# Patient Record
Sex: Female | Born: 1940 | Race: Black or African American | Hispanic: No | Marital: Single | Smoking: Never smoker
Health system: Southern US, Community
[De-identification: ages and names within clinical notes are randomized; demographics above are authoritative.]

## PROBLEM LIST (undated history)

## (undated) DIAGNOSIS — E7800 Pure hypercholesterolemia, unspecified: Secondary | ICD-10-CM

## (undated) DIAGNOSIS — I10 Essential (primary) hypertension: Secondary | ICD-10-CM

---

## 2018-08-28 ENCOUNTER — Encounter (HOSPITAL_COMMUNITY): Payer: Self-pay

## 2018-08-28 ENCOUNTER — Emergency Department (HOSPITAL_COMMUNITY)
Admission: EM | Admit: 2018-08-28 | Discharge: 2018-08-28 | Disposition: A | Discharge status: Home / Self Care | Payer: BC Managed Care – PPO | Attending: Emergency Medicine | Admitting: Emergency Medicine

## 2018-08-28 ENCOUNTER — Other Ambulatory Visit: Payer: Self-pay

## 2018-08-28 DIAGNOSIS — I10 Essential (primary) hypertension: Secondary | ICD-10-CM

## 2018-08-28 LAB — COMPREHENSIVE METABOLIC PANEL
ALK PHOS: 74 U/L (ref 38–126)
ALT: 22 U/L (ref 0–44)
ANION GAP: 10 (ref 5–15)
AST: 28 U/L (ref 15–41)
Albumin: 4.1 g/dL (ref 3.5–5.0)
BILIRUBIN TOTAL: 0.9 mg/dL (ref 0.3–1.2)
BUN: 16 mg/dL (ref 8–23)
CALCIUM: 9.6 mg/dL (ref 8.9–10.3)
CO2: 28 mmol/L (ref 22–32)
Chloride: 106 mmol/L (ref 98–111)
Creatinine, Ser: 0.81 mg/dL (ref 0.44–1.00)
GFR calc Af Amer: >60 mL/min (ref >60)
Glucose, Bld: 92 mg/dL (ref 70–99)
Potassium: 4.1 mmol/L (ref 3.5–5.1)
Sodium: 144 mmol/L (ref 135–145)
TOTAL PROTEIN: 7.9 g/dL (ref 6.5–8.1)

## 2018-08-28 LAB — CBC
HEMATOCRIT: 41.9 % (ref 36.0–46.0)
Hemoglobin: 13.6 g/dL (ref 12.0–15.0)
MCH: 29.8 pg (ref 26.0–34.0)
MCHC: 32.5 g/dL (ref 30.0–36.0)
MCV: 91.9 fL (ref 78.0–100.0)
Platelets: 259 10*3/uL (ref 150–400)
RBC: 4.56 MIL/uL (ref 3.87–5.11)
RDW: 13.8 % (ref 11.5–15.5)
WBC: 9.5 10*3/uL (ref 4.0–10.5)

## 2018-08-28 MED ORDER — HYDROCHLOROTHIAZIDE 25 MG PO TABS: 25.0000 mg | ORAL_TABLET | Freq: Every day | ORAL | 1 refills | Status: AC

## 2018-08-28 MED ORDER — HYDROCHLOROTHIAZIDE 25 MG PO TABS
25.0000 mg | ORAL_TABLET | Freq: Once | ORAL | Status: AC
  Administered 2018-08-28: 25 mg via ORAL
  Filled 2018-08-28: qty 1

## 2018-08-28 NOTE — ED Notes (Signed)
Pt still denying any symptoms at this time. Pt denies dizziness, HA

## 2018-08-28 NOTE — ED Triage Notes (Signed)
She states she was slated to under go cataract removal this morning by Dr. Elmer PickerHecker. She was found at that time to be hypertensive. They sent her to her [facility] pcp, who subsequently sent her here. She is alert and oriented x 4 with clear speech. She denis any pain or discomfort, nor any sign of current illness.

## 2018-08-28 NOTE — Discharge Instructions (Addendum)
You were evaluated in the Emergency Department and after careful evaluation, we did not find any emergent condition requiring admission or further testing in the hospital.  It appears that you have essential hypertension.  With no symptoms today, this blood pressure would be best managed by your primary care provider.  We have provided you with a prescription for hydrochlorothiazide, which she should take daily until you meet with your primary care provider to discuss your long-term regimen.  Please return to the Emergency Department if you experience any worsening of your condition.  We encourage you to follow up with a primary care provider.  Thank you for allowing us to be a part of your care.

## 2018-08-28 NOTE — ED Provider Notes (Signed)
[facility] Emergency Department Provider Note MRN:  [id number]  Arrival date & time: 08/28/18     Chief Complaint   Hypertension   History of Present Illness   Pam [name] is a 77 y.o. year-old female with a history of arthritis presenting to the ED with chief complaint of hypertension.  Patient was found to be hypertensive today at her ophthalmology office, was scheduled to have her cataract surgery.  In response to this hypertension, patient was sent to her PCP, who subsequently sent her to the emergency department.  Patient feels completely her normal self, denies headache, vision change, no chest pain or shortness of breath, no recent fevers, no nausea or vomiting, no abdominal pain, no numbness or weakness in the arms or legs.  Does not take any medications for blood pressure.  Review of Systems  A complete 10 system review of systems was obtained and all systems are negative except as noted in the HPI and PMH.   Patient's Health History   Past medical history: Arthritis   No family history on file.  Social History   Socioeconomic History  . Marital status: Single    Spouse name: Not on file  . Number of children: Not on file  . Years of education: Not on file  . Highest education level: Not on file  Occupational History  . Not on file  Social Needs  . Financial resource strain: Not on file  . Food insecurity:    Worry: Not on file    Inability: Not on file  . Transportation needs:    Medical: Not on file    Non-medical: Not on file  Tobacco Use  . Smoking status: Never Smoker  . Smokeless tobacco: Never Used  Substance and Sexual Activity  . Alcohol use: Not on file  . Drug use: Not on file  . Sexual activity: Not on file  Lifestyle  . Physical activity:    Days per week: Not on file    Minutes per session: Not on file  . Stress: Not on file  Relationships  . Social connections:    Talks on phone: Not on file    Gets together: Not on  file    Attends religious service: Not on file    Active member of club or organization: Not on file    Attends meetings of clubs or organizations: Not on file    Relationship status: Not on file  . Intimate partner violence:    Fear of current or ex partner: Not on file    Emotionally abused: Not on file    Physically abused: Not on file    Forced sexual activity: Not on file  Other Topics Concern  . Not on file  Social History Narrative  . Not on file     Physical Exam  Vital Signs and Nursing Notes reviewed Vitals:   08/28/18 1615 08/28/18 1630  BP:  (!) 202/88  Pulse: 68 74  Resp: 12 16  Temp:    SpO2: 98% 99%    CONSTITUTIONAL: Well-appearing, NAD NEURO:  Alert and oriented x 3, no focal deficits EYES:  eyes equal and reactive ENT/NECK:  no LAD, no JVD CARDIO: Regular rate, well-perfused, normal S1 and S2 PULM:  CTAB no wheezing or rhonchi GI/GU:  normal bowel sounds, non-distended, non-tender MSK/SPINE:  No gross deformities, no edema SKIN:  no rash, atraumatic PSYCH:  Appropriate speech and behavior  Diagnostic and Interventional Summary    EKG Interpretation  Date/Time:    Ventricular Rate:    PR Interval:    QRS Duration:   QT Interval:    QTC Calculation:   R Axis:     Text Interpretation:        Labs Reviewed  CBC  COMPREHENSIVE METABOLIC PANEL    No orders to display    Medications  hydrochlorothiazide (HYDRODIURIL) tablet 25 mg (25 mg Oral Given 08/28/18 1617)     Procedures Critical Care  ED Course and Medical Decision Making  I have reviewed the triage vital signs and the nursing notes.  Pertinent labs & imaging results that were available during my care of the patient were reviewed by me and considered in my medical decision making (see below for details).    Asymptomatic hypertension in the [age] year old female.  Provided reassurance.  Patient has never been told she has had hypertension in the past, is not on any medications, has  not had blood work done in 6 months.  Will perform screening labs today to ensure normal kidney function advise close PCP follow-up.  Labs reassuring, patient tolerated dose of HCTZ well here in the ED.  Prescription for the same, will follow up with PCP.  After the discussed management above, the patient was determined to be safe for discharge.  The patient was in agreement with this plan and all questions regarding their care were answered.  ED return precautions were discussed and the patient will return to the ED with any significant worsening of condition.  Pam SowMichael [name]. Pilar PlateBero, MD [facility] [email or web address]  Final Clinical Impressions(s) / ED Diagnoses     ICD-10-CM   1. Hypertension, unspecified type I10     ED Discharge Orders         Ordered    hydrochlorothiazide (HYDRODIURIL) 25 MG tablet  Daily     08/28/18 1659             Sabas SousBero, Pam Flight M, MD 08/28/18 1702

## 2018-08-28 NOTE — ED Notes (Signed)
Pt ambulated to the restroom with out difficulty.

## 2021-04-29 ENCOUNTER — Encounter (HOSPITAL_BASED_OUTPATIENT_CLINIC_OR_DEPARTMENT_OTHER): Payer: BC Managed Care – PPO | Admitting: Internal Medicine

## 2021-05-14 ENCOUNTER — Encounter (HOSPITAL_BASED_OUTPATIENT_CLINIC_OR_DEPARTMENT_OTHER): Payer: BC Managed Care – PPO | Attending: Internal Medicine | Admitting: Internal Medicine

## 2021-05-14 ENCOUNTER — Other Ambulatory Visit: Payer: Self-pay

## 2021-05-14 DIAGNOSIS — I872 Venous insufficiency (chronic) (peripheral): Secondary | ICD-10-CM | POA: Insufficient documentation

## 2021-05-14 DIAGNOSIS — L97822 Non-pressure chronic ulcer of other part of left lower leg with fat layer exposed: Secondary | ICD-10-CM | POA: Insufficient documentation

## 2021-05-14 DIAGNOSIS — I10 Essential (primary) hypertension: Secondary | ICD-10-CM | POA: Diagnosis not present

## 2021-05-14 DIAGNOSIS — E7800 Pure hypercholesterolemia, unspecified: Secondary | ICD-10-CM | POA: Diagnosis not present

## 2021-05-14 DIAGNOSIS — I87332 Chronic venous hypertension (idiopathic) with ulcer and inflammation of left lower extremity: Secondary | ICD-10-CM | POA: Diagnosis not present

## 2021-05-17 NOTE — Progress Notes (Signed)
Pam [name], Pam [name] ([id number]) Visit Report for 05/14/2021 Chief Complaint Document Details Patient Name: Date of Service: Pam [name] 05/14/2021 1:15 PM Medical Record Number: [id number] Patient Account Number: [id number] Date of Birth/Sex: Treating RN: 08-25-1941 (80 y.o. Female) [name] Iba Primary Care Provider: [name] Other Clinician: Referring Provider: Treating Provider/Extender: Angus Palms Weeks in Treatment: 0 Information Obtained from: Patient Chief Complaint 05/14/2021; patient is here for review of a wound on her left medial lower leg Electronic Signature(s) Signed: 05/15/2021 7:13:41 AM By: Baltazar Najjar MD Entered By: Baltazar Najjar on 05/14/2021 15:22:54 -------------------------------------------------------------------------------- Debridement Details Patient Name: Date of Service: Pam [name] 05/14/2021 1:15 PM Medical Record Number: [id number] Patient Account Number: [id number] Date of Birth/Sex: Treating RN: 1941-02-24 (80 y.o. Female) [name] Iba Primary Care Provider: [name] Other Clinician: Referring Provider: Treating Provider/Extender: Angus Palms Weeks in Treatment: 0 Debridement Performed for Assessment: Wound #1 Left,Medial Lower Leg Performed By: Physician Maxwell Caul., MD Debridement Type: Debridement Severity of Tissue Pre Debridement: Fat layer exposed Level of Consciousness (Pre-procedure): Awake and Alert Pre-procedure Verification/Time Out Yes - 14:29 Taken: Start Time: 14:30 Pain Control: Lidocaine 4% T opical Solution T Area Debrided (L x W): otal 6.5 (cm) x 9 (cm) = 58.5 (cm) Tissue and other material debrided: Non-Viable, Slough, Subcutaneous, Slough Level: Skin/Subcutaneous Tissue Debridement Description: Excisional Instrument: Curette Bleeding: Minimum Hemostasis Achieved: Pressure End Time: 14:39 Response to Treatment: Procedure was tolerated well Level of Consciousness (Post-  Awake and Alert procedure): Post Debridement Measurements of Total Wound Length: (cm) 6.5 Width: (cm) 9 Depth: (cm) 0.1 Volume: (cm) 4.595 Character of Wound/Ulcer Post Debridement: Stable Severity of Tissue Post Debridement: Fat layer exposed Post Procedure Diagnosis Same as Pre-procedure Electronic Signature(s) Signed: 05/14/2021 6:03:14 PM By: [name] Iba Signed: 05/15/2021 7:13:41 AM By: Baltazar Najjar MD Entered By: Baltazar Najjar on 05/14/2021 15:22:05 -------------------------------------------------------------------------------- HPI Details Patient Name: Date of Service: MANNIE, WINELAND 05/14/2021 1:15 PM Medical Record Number: [id number] Patient Account Number: [id number] Date of Birth/Sex: Treating RN: May 04, 1941 (80 y.o. Female) [name] Iba Primary Care Provider: [name] Other Clinician: Referring Provider: Treating Provider/Extender: Angus Palms Weeks in Treatment: 0 History of Present Illness HPI Description: ADMISSION 05/14/2021 with This is a 80 year old reasonably healthy woman who is still working at [employer] and about to retire at the end of the month. She has had a problem with lower extremity edema for about the last 6 months she tells me. 2 months ago she noted small bumps on both of her legs however the left one medially morphed into a large blister. She was seen by her primary physician Dr. [name] on 04/19/2021. Put on doxycycline. He noted at that time that the wound had been weeping for the last 5 weeks. The patient does not have a wound history. She has been placing hydrocortisone on her wound but nothing else and no compression Past medical history includes hypertension, osteoarthritis and hypercholesterolemia ABI in our clinic was quite normal at 0.93 on the left Electronic Signature(s) Signed: 05/15/2021 7:13:41 AM By: Baltazar Najjar MD Entered By: Baltazar Najjar on 05/14/2021  15:24:49 -------------------------------------------------------------------------------- Physical Exam Details Patient Name: Date of Service: Pam [name] 05/14/2021 1:15 PM Medical Record Number: [id number] Patient Account Number: [id number] Date of Birth/Sex: Treating RN: [birth year] (80 y.o. Female) [name] Iba Primary Care Provider: [name] Other Clinician: Referring Provider: Treating Provider/Extender: Angus Palms Weeks in Treatment: 0 Constitutional Patient is hypertensive.. Pulse regular and within target range for  patient.Marland Kitchen Respirations regular, non-labored and within target range.. Temperature is normal and within the target range for the patient.Marland Kitchen Appears in no distress. Respiratory work of breathing is normal. Cardiovascular Pedal pulses are palpable. 3+ edema in both lower legs pitting to the level of her knees. No warmth or tenderness. Gastrointestinal (GI) . Genitourinary (GU) . Psychiatric Patient appears depressed today. Somewhat angry. Notes Wound exam; the area in question is on the left medial lower leg. Large amount of flaking epidermis around this some erythema but no obvious cellulitis currently. The remaining wound is superficial. Tightly adherent debris on the surface. I used a #5 curette to remove as much of this is possible this cleans up quite nicely. Electronic Signature(s) Signed: 05/15/2021 7:13:41 AM By: Baltazar Najjar MD Entered By: Baltazar Najjar on 05/14/2021 15:26:47 -------------------------------------------------------------------------------- Physician Orders Details Patient Name: Date of Service: Pam [name] 05/14/2021 1:15 PM Medical Record Number: [id number] Patient Account Number: [id number] Date of Birth/Sex: Treating RN: Aug 01, 1941 (80 y.o. Female) Fonnie Mu Primary Care Provider: [name] Other Clinician: Referring Provider: Treating Provider/Extender: Angus Palms Weeks in  Treatment: 0 Verbal / Phone Orders: No Diagnosis Coding Follow-up Appointments ppointment in 1 week. - with Dr. Leanord Hawking Return A Edema Control - Lymphedema / SCD / Other Avoid standing for long periods of time. Exercise regularly Other Edema Control Orders/Instructions: - Whenever sitting elevate legs at least to level of belly button. Additional Orders / Instructions Follow Nutritious Diet Wound Treatment Wound #1 - Lower Leg Wound Laterality: Left, Medial Cleanser: Soap and Water 1 x Per Week/30 Days Discharge Instructions: May shower and wash wound with dial antibacterial soap and water prior to dressing change. Cleanser: Wound Cleanser 1 x Per Week/30 Days Discharge Instructions: Cleanse the wound with wound cleanser prior to applying a clean dressing using gauze sponges, not tissue or cotton balls. Peri-Wound Care: Triamcinolone 15 (g) 1 x Per Week/30 Days Discharge Instructions: T wound and periwound o Peri-Wound Care: Sween Lotion (Moisturizing lotion) 1 x Per Week/30 Days Discharge Instructions: Apply to periwound Prim Dressing: IODOFLEX 0.9% Cadexomer Iodine Pad 4x6 cm 1 x Per Week/30 Days ary Discharge Instructions: Apply to wound bed as instructed Secondary Dressing: Woven Gauze Sponge, Non-Sterile 4x4 in 1 x Per Week/30 Days Discharge Instructions: Apply over primary dressing as directed. Secondary Dressing: ABD Pad, 8x10 1 x Per Week/30 Days Discharge Instructions: Apply over primary dressing as directed. Compression Wrap: FourPress (4 layer compression wrap) 1 x Per Week/30 Days Discharge Instructions: Apply four layer compression as directed. May also use Miliken CoFlex 2 layer compression system as alternative. Electronic Signature(s) Signed: 05/15/2021 7:13:41 AM By: Baltazar Najjar MD Signed: 05/17/2021 5:37:32 PM By: Fonnie Mu RN Entered By: Fonnie Mu on 05/14/2021  14:43:56 -------------------------------------------------------------------------------- Problem List Details Patient Name: Date of Service: NAHDIA, DOUCET 05/14/2021 1:15 PM Medical Record Number: [id number] Patient Account Number: [id number] Date of Birth/Sex: Treating RN: May 02, 1941 (80 y.o. Female) [name] Iba Primary Care Provider: [name] Other Clinician: Referring Provider: Treating Provider/Extender: Angus Palms Weeks in Treatment: 0 Active Problems ICD-10 Encounter Code Description Active Date MDM Diagnosis I87.332 Chronic venous hypertension (idiopathic) with ulcer and inflammation of left 05/14/2021 No Yes lower extremity L97.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 05/14/2021 No Yes of skin Inactive Problems Resolved Problems Electronic Signature(s) Signed: 05/15/2021 7:13:41 AM By: Baltazar Najjar MD Entered By: Baltazar Najjar on 05/14/2021 14:47:01 -------------------------------------------------------------------------------- Progress Note Details Patient Name: Date of Service: [address]  NELL 05/14/2021 1:15 PM Medical Record Number: [id number] Patient Account Number: [id number] Date of Birth/Sex: Treating RN: 06-27-1941 (80 y.o. Female) [name] Iba Primary Care Provider: [name] Other Clinician: Referring Provider: Treating Provider/Extender: Angus Palms Weeks in Treatment: 0 Subjective Chief Complaint Information obtained from Patient 05/14/2021; patient is here for review of a wound on her left medial lower leg History of Present Illness (HPI) ADMISSION 05/14/2021 with This is a 80 year old reasonably healthy woman who is still working at [employer] and about to retire at the end of the month. She has had a problem with lower extremity edema for about the last 6 months she tells me. 2 months ago she noted small bumps on both of her legs however the left one medially morphed  into a large blister. She was seen by her primary physician Dr. [name] on 04/19/2021. Put on doxycycline. He noted at that time that the wound had been weeping for the last 5 weeks. The patient does not have a wound history. She has been placing hydrocortisone on her wound but nothing else and no compression Past medical history includes hypertension, osteoarthritis and hypercholesterolemia ABI in our clinic was quite normal at 0.93 on the left Patient History Information obtained from Patient. Allergies No Known Allergies Family History Unknown History. Social History Never smoker, Marital Status - Single, Alcohol Use - Never, Drug Use - No History, Caffeine Use - Moderate. Medical History Eyes Patient has history of Cataracts, Glaucoma Denies history of Optic Neuritis Ear/Nose/Mouth/Throat Denies history of Chronic sinus problems/congestion, Middle ear problems Hematologic/Lymphatic Denies history of Anemia, Hemophilia, Human Immunodeficiency Virus, Lymphedema, Sickle Cell Disease Respiratory Denies history of Aspiration, Asthma, Chronic Obstructive Pulmonary Disease (COPD), Pneumothorax, Sleep Apnea, Tuberculosis Cardiovascular Patient has history of Hypertension Denies history of Angina, Arrhythmia, Congestive Heart Failure, Coronary Artery Disease, Deep Vein Thrombosis, Hypotension, Myocardial Infarction, Peripheral Arterial Disease, Peripheral Venous Disease, Phlebitis, Vasculitis Gastrointestinal Denies history of Cirrhosis , Colitis, Crohnoos, Hepatitis A, Hepatitis B, Hepatitis C Endocrine Denies history of Type I Diabetes, Type II Diabetes Genitourinary Denies history of End Stage Renal Disease Immunological Denies history of Lupus Erythematosus, Raynaudoos, Scleroderma Integumentary (Skin) Denies history of History of Burn Musculoskeletal Patient has history of Osteoarthritis Denies history of Gout, Rheumatoid Arthritis, Osteomyelitis Neurologic Denies history of  Dementia, Neuropathy, Quadriplegia, Paraplegia, Seizure Disorder Oncologic Denies history of Received Chemotherapy, Received Radiation Medical A Surgical History Notes nd Cardiovascular hyperlipidemia Review of Systems (ROS) Constitutional Symptoms (General Health) Denies complaints or symptoms of Fatigue, Fever, Chills, Marked Weight Change. Eyes Denies complaints or symptoms of Dry Eyes, Vision Changes, Glasses / Contacts. Ear/Nose/Mouth/Throat Denies complaints or symptoms of Chronic sinus problems or rhinitis. Respiratory Denies complaints or symptoms of Chronic or frequent coughs, Shortness of Breath. Cardiovascular Denies complaints or symptoms of Chest pain. Gastrointestinal Denies complaints or symptoms of Frequent diarrhea, Nausea, Vomiting. Endocrine Denies complaints or symptoms of Heat/cold intolerance. Genitourinary Denies complaints or symptoms of Frequent urination. Musculoskeletal Denies complaints or symptoms of Muscle Pain, Muscle Weakness. Neurologic Denies complaints or symptoms of Numbness/parasthesias. Psychiatric Denies complaints or symptoms of Claustrophobia, Suicidal. Objective Constitutional Patient is hypertensive.. Pulse regular and within target range for patient.Marland Kitchen Respirations regular, non-labored and within target range.. Temperature is normal and within the target range for the patient.Marland Kitchen Appears in no distress. Vitals Time Taken: 2:04 PM, Height: 64 in, Source: Stated, Weight: 180 lbs, Source: Stated, BMI: 30.9, Temperature: 97.9 F, Pulse: 64 bpm, Respiratory Rate: 17 breaths/min, Blood Pressure: 150/77 mmHg. Respiratory work  of breathing is normal. Cardiovascular Pedal pulses are palpable. 3+ edema in both lower legs pitting to the level of her knees. No warmth or tenderness. Psychiatric Patient appears depressed today. Somewhat angry. General Notes: Wound exam; the area in question is on the left medial lower leg. Large amount of flaking  epidermis around this some erythema but no obvious cellulitis currently. The remaining wound is superficial. Tightly adherent debris on the surface. I used a #5 curette to remove as much of this is possible this cleans up quite nicely. Integumentary (Hair, Skin) Wound #1 status is Open. Original cause of wound was Gradually Appeared. The date acquired was: 03/14/2021. The wound is located on the Left,Medial Lower Leg. The wound measures 6.5cm length x 9cm width x 0.1cm depth; 45.946cm^2 area and 4.595cm^3 volume. There is Fat Layer (Subcutaneous Tissue) exposed. There is no tunneling or undermining noted. There is a medium amount of serosanguineous drainage noted. The wound margin is distinct with the outline attached to the wound base. There is medium (34-66%) red, pink granulation within the wound bed. There is a medium (34-66%) amount of necrotic tissue within the wound bed including Adherent Slough. 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 There was a Excisional Skin/Subcutaneous Tissue Debridement with a total area of 58.5 sq cm performed by Maxwell Caul., MD. With the following instrument(s): Curette to remove Non-Viable tissue/material. Material removed includes Subcutaneous Tissue and Slough and after achieving pain control using Lidocaine 4% T opical Solution. No specimens were taken. A time out was conducted at 14:29, prior to the start of the procedure. A Minimum amount of bleeding was controlled with Pressure. The procedure was tolerated well. Post Debridement Measurements: 6.5cm length x 9cm width x 0.1cm depth;  4.595cm^3 volume. Character of Wound/Ulcer Post Debridement is stable. Severity of Tissue Post Debridement is: Fat layer exposed. Post procedure Diagnosis Wound #1: Same as Pre-Procedure Plan Follow-up Appointments: Return Appointment in 1 week. - with Dr. Leanord Hawking Edema Control - Lymphedema / SCD / Other: Avoid standing for long periods of time. Exercise regularly Other Edema Control Orders/Instructions: - Whenever sitting elevate legs at least to level of belly button. Additional Orders / Instructions: Follow Nutritious Diet WOUND #1: - Lower Leg Wound Laterality: Left, Medial Cleanser: Soap and Water 1 x Per Week/30 Days Discharge Instructions: May shower and wash wound with dial antibacterial soap and water prior to dressing change. Cleanser: Wound Cleanser 1 x Per Week/30 Days Discharge Instructions: Cleanse the wound with wound cleanser prior to applying a clean dressing using gauze sponges, not tissue or cotton balls. Peri-Wound Care: Triamcinolone 15 (g) 1 x Per Week/30 Days Discharge Instructions: T wound and periwound o Peri-Wound Care: Sween Lotion (Moisturizing lotion) 1 x Per Week/30 Days Discharge Instructions: Apply to periwound Prim Dressing: IODOFLEX 0.9% Cadexomer Iodine Pad 4x6 cm 1 x Per Week/30 Days ary Discharge Instructions: Apply to wound bed as instructed Secondary Dressing: Woven Gauze Sponge, Non-Sterile 4x4 in 1 x Per Week/30 Days Discharge Instructions: Apply over primary dressing as directed. Secondary Dressing: ABD Pad, 8x10 1 x Per Week/30 Days Discharge Instructions: Apply over primary dressing as directed. Com pression Wrap: FourPress (4 layer compression wrap) 1 x Per  Week/30 Days Discharge Instructions: Apply four layer compression as directed. May also use Miliken CoFlex 2 layer compression system as alternative. 1. Liberal use of TCA 2. Iodoflex/ABDs under 4-layer compression. We will see her back next week 3. Dressing to be left intact. I advised  her to keep her leg elevated even at work as much as she could. Activity is encouraged to be because muscle contraction promotes removal of fluids from the lower extremities. 4. We will see her back next week. I do not think she requires venous reflux studies at present we will see how she does in terms of healing this. She is definitely going to need compression stockings after this closes 5. I note she is on amlodipine as a blood pressure medication which can sometimes add to edema. Might not be the best choice for her she is seeing her primary doctor on Monday 6. I see no evidence of infection currently however the loss of surface epithelium looks quite characteristic of a wound cellulitis presumably the doxycycline has taken care of this. I spent 40 minutes in review of this patient's past medical history, face-to-face evaluation and preparation of this record Electronic Signature(s) Signed: 05/15/2021 7:13:41 AM By: Baltazar Najjar MD Entered By: Baltazar Najjar on 05/14/2021 15:28:58 -------------------------------------------------------------------------------- HxROS Details Patient Name: Date of Service: LEZA, APSEY 05/14/2021 1:15 PM Medical Record Number: [id number] Patient Account Number: [id number] Date of Birth/Sex: Treating RN: March 08, 1941 (80 y.o. Female) Fonnie Mu Primary Care Provider: [name] Other Clinician: Referring Provider: Treating Provider/Extender: Angus Palms Weeks in Treatment: 0 Information Obtained From Patient Constitutional Symptoms (General Health) Complaints and Symptoms: Negative for: Fatigue; Fever; Chills; Marked Weight Change Eyes Complaints and Symptoms: Negative for: Dry Eyes; Vision Changes; Glasses / Contacts Medical History: Positive for: Cataracts; Glaucoma Negative for: Optic Neuritis Ear/Nose/Mouth/Throat Complaints and Symptoms: Negative for: Chronic sinus problems or rhinitis Medical History: Negative  for: Chronic sinus problems/congestion; Middle ear problems Respiratory Complaints and Symptoms: Negative for: Chronic or frequent coughs; Shortness of Breath Medical History: Negative for: Aspiration; Asthma; Chronic Obstructive Pulmonary Disease (COPD); Pneumothorax; Sleep Apnea; Tuberculosis Cardiovascular Complaints and Symptoms: Negative for: Chest pain Medical History: Positive for: Hypertension Negative for: Angina; Arrhythmia; Congestive Heart Failure; Coronary Artery Disease; Deep Vein Thrombosis; Hypotension; Myocardial Infarction; Peripheral Arterial Disease; Peripheral Venous Disease; Phlebitis; Vasculitis Past Medical History Notes: hyperlipidemia Gastrointestinal Complaints and Symptoms: Negative for: Frequent diarrhea; Nausea; Vomiting Medical History: Negative for: Cirrhosis ; Colitis; Crohns; Hepatitis A; Hepatitis B; Hepatitis C Endocrine Complaints and Symptoms: Negative for: Heat/cold intolerance Medical History: Negative for: Type I Diabetes; Type II Diabetes Genitourinary Complaints and Symptoms: Negative for: Frequent urination Medical History: Negative for: End Stage Renal Disease Musculoskeletal Complaints and Symptoms: Negative for: Muscle Pain; Muscle Weakness Medical History: Positive for: Osteoarthritis Negative for: Gout; Rheumatoid Arthritis; Osteomyelitis Neurologic Complaints and Symptoms: Negative for: Numbness/parasthesias Medical History: Negative for: Dementia; Neuropathy; Quadriplegia; Paraplegia; Seizure Disorder Psychiatric Complaints and Symptoms: Negative for: Claustrophobia; Suicidal Hematologic/Lymphatic Medical History: Negative for: Anemia; Hemophilia; Human Immunodeficiency Virus; Lymphedema; Sickle Cell Disease Immunological Medical History: Negative for: Lupus Erythematosus; Raynauds; Scleroderma Integumentary (Skin) Medical History: Negative for: History of Burn Oncologic Medical History: Negative for: Received  Chemotherapy; Received Radiation HBO Extended History Items Eyes: Eyes: Cataracts Glaucoma Immunizations Pneumococcal Vaccine: Received Pneumococcal Vaccination: Yes Implantable Devices None Family and Social History Unknown History: Yes; Never smoker; Marital Status - Single; Alcohol Use: Never; Drug Use: No History; Caffeine Use: Moderate; Financial Concerns: No; Food, Clothing or Shelter Needs: No; Support  System Lacking: No; Transportation Concerns: No Electronic Signature(s) Signed: 05/15/2021 7:13:41 AM By: [name], Payeton Germani MD Signed: 05/17/2021 5:37:32 PM By: Fonnie MuBreedlove, Lauren RN Entered By: Fonnie MuBreedlove, Lauren on 05/14/2021 14:09:46 -------------------------------------------------------------------------------- SuperBill Details Patient Name: Date of Service: [name], [name] 05/14/2021 Medical Record Number: [id number] Patient Account Number: [id number] Date of Birth/Sex: Treating RN: 09-05-1941 (80 y.o. Female) [name] IbaBarnhart, Jodi Primary Care Provider: Deatra JamesSun, [name] Other Clinician: Referring Provider: Treating Provider/Extender: Angus Palmsobson, Harlan Ervine Sun, [name] Weeks in Treatment: 0 Diagnosis Coding ICD-10 Codes Code Description [phone] Chronic venous hypertension (idiopathic) with ulcer and inflammation of left lower extremity L97.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 of skin Facility Procedures CPT4 Code: [id number] Description: 99213 - WOUND CARE VISIT-LEV 3 EST PT Modifier: 25 Quantity: 1 CPT4 Code: [id number] Description: 11042 - DEB SUBQ TISSUE 20 SQ CM/< ICD-10 Diagnosis Description L97.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 Modifier: of skin Quantity: 1 CPT4 Code: [id number] Description: 11045 - DEB SUBQ TISS EA ADDL 20CM ICD-10 Diagnosis Description L97.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 Modifier: of skin Quantity: 2 Physician  Procedures : CPT4 Code Description Modifier [id number] WC PHYS LEVEL 3 NEW PT 25 ICD-10 Diagnosis Description I87.332 Chronic venous hypertension (idiopathic) with ulcer and inflammation of left lower extremity L97.821 Non-pressure chronic ulcer of other part of left  lower leg limited to breakdown of skin Quantity: 1 : [id number] 11042 - WC PHYS SUBQ TISS 20 SQ CM ICD-10 Diagnosis Description L97.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 of skin Quantity: 1 : [id number] 11045 - WC PHYS SUBQ TISS EA ADDL 20 CM ICD-10 Diagnosis Description L97.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 of skin Quantity: 2 Electronic Signature(s) Signed: 05/15/2021 7:13:41 AM By: [name], Jimmie Rueter MD Previous Signature: 05/14/2021 3:04:22 PM Version By: [name] IbaBarnhart, Jodi Entered By: [name], Kairee Kozma on 05/14/2021 15:29:31

## 2021-05-17 NOTE — Progress Notes (Signed)
Pam [name], Pam [name] ([id number]) Visit Report for 05/14/2021 Allergy List Details Patient Name: Date of Service: Pam [name], Pam [name] 05/14/2021 1:15 PM Medical Record Number: [id number] Patient Account Number: [id number] Date of Birth/Sex: Treating RN: [birth year] (80 y.o. Female) Fonnie Mu Primary Care Udell Blasingame: [name] Other Clinician: Referring Boen Sterbenz: Treating Breon Rehm/Extender: Angus Palms Weeks in Treatment: 0 Allergies Active Allergies No Known Allergies Allergy Notes Electronic Signature(s) Signed: 05/17/2021 5:37:32 PM By: Fonnie Mu RN Entered By: Fonnie Mu on 05/14/2021 14:06:49 -------------------------------------------------------------------------------- Arrival Information Details Patient Name: Date of Service: Pam [name], Pam [name] 05/14/2021 1:15 PM Medical Record Number: [id number] Patient Account Number: [id number] Date of Birth/Sex: Treating RN: [birth year] (80 y.o. Female) Fonnie Mu Primary Care Hortencia Martire: [name] Other Clinician: Referring Andreyah Natividad: Treating Scottlynn Lindell/Extender: Angus Palms Weeks in Treatment: 0 Visit Information Patient Arrived: Ambulatory Arrival Time: 14:04 Accompanied By: self Transfer Assistance: None Patient Identification Verified: Yes Secondary Verification Process Completed: Yes Patient Requires Transmission-Based Precautions: No Patient Has Alerts: No Electronic Signature(s) Signed: 05/17/2021 5:37:32 PM By: Fonnie Mu RN Entered By: Fonnie Mu on 05/14/2021 14:04:36 -------------------------------------------------------------------------------- Clinic Level of Care Assessment Details Patient Name: Date of Service: Pam [name], Pam [name] 05/14/2021 1:15 PM Medical Record Number: [id number] Patient Account Number: [id number] Date of Birth/Sex: Treating RN: 07/27/1941 (80 y.o. Female) Fonnie Mu Primary Care Arnold Depinto: [name] Other Clinician: Referring  Woodfin Kiss: Treating Prabhav Faulkenberry/Extender: Angus Palms Weeks in Treatment: 0 Clinic Level of Care Assessment Items TOOL 1 Quantity Score X- 1 0 Use when EandM and Procedure is performed on INITIAL visit ASSESSMENTS - Nursing Assessment / Reassessment X- 1 20 General Physical Exam (combine w/ comprehensive assessment (listed just below) when performed on new pt. evals) X- 1 25 Comprehensive Assessment (HX, ROS, Risk Assessments, Wounds Hx, etc.) ASSESSMENTS - Wound and Skin Assessment / Reassessment []  - 0 Dermatologic / Skin Assessment (not related to wound area) ASSESSMENTS - Ostomy and/or Continence Assessment and Care []  - 0 Incontinence Assessment and Management []  - 0 Ostomy Care Assessment and Management (repouching, etc.) PROCESS - Coordination of Care []  - 0 Simple Patient / Family Education for ongoing care X- 1 20 Complex (extensive) Patient / Family Education for ongoing care X- 1 10 Staff obtains , Records, T Results / Process Orders est []  - 0 Staff telephones HHA, Nursing Homes / Clarify orders / etc []  - 0 Routine Transfer to another Facility (non-emergent condition) []  - 0 Routine Hospital Admission (non-emergent condition) []  - 0 New Admissions / / Ordering NPWT Apligraf, etc. , []  - 0 Emergency Hospital Admission (emergent condition) PROCESS - Special Needs []  - 0 Pediatric / Minor Patient Management []  - 0 Isolation Patient Management []  - 0 Hearing / Language / Visual special needs []  - 0 Assessment of Community assistance (transportation, D/C planning, etc.) []  - 0 Additional assistance / Altered mentation []  - 0 Support Surface(s) Assessment (bed, cushion, seat, etc.) INTERVENTIONS - Miscellaneous []  - 0 External ear exam []  - 0 Patient Transfer (multiple staff / / Similar devices) []  - 0 Simple Staple / Suture removal (25 or less) []  - 0 Complex Staple / Suture removal (26 or  more) []  - 0 Hypo/Hyperglycemic Management (do not check if billed separately) X- 1 15 Ankle / Brachial Index (ABI) - do not check if billed separately Has the patient been seen at the hospital within the last three years: Yes Total Score: 90 Level Of Care: New/Established - Level 3 Electronic Signature(s) Signed: 05/17/2021 5:37:32 PM  By: Fonnie Mu RN Entered By: Fonnie Mu on 05/14/2021 14:44:56 -------------------------------------------------------------------------------- Encounter Discharge Information Details Patient Name: Date of Service: Pam [name], Pam [name] 05/14/2021 1:15 PM Medical Record Number: [id number] Patient Account Number: [id number] Date of Birth/Sex: Treating RN: 1941/12/05 (80 y.o. Female) Zenaida Deed Primary Care Kadience Macchi: [name] Other Clinician: Referring Theodora Lalanne: Treating Alyxander Kollmann/Extender: Angus Palms Weeks in Treatment: 0 Encounter Discharge Information Items Post Procedure Vitals Discharge Condition: Stable Temperature (F): 97.9 Ambulatory Status: Ambulatory Pulse (bpm): 64 Discharge Destination: Home Respiratory Rate (breaths/min): 18 Transportation: Private Auto Blood Pressure (mmHg): 150/77 Accompanied By: self Schedule Follow-up Appointment: Yes Clinical Summary of Care: Patient Declined Electronic Signature(s) Signed: 05/14/2021 6:25:30 PM By: Zenaida Deed RN, BSN Entered By: Zenaida Deed on 05/14/2021 18:17:34 -------------------------------------------------------------------------------- Lower Extremity Assessment Details Patient Name: Date of Service: Pam [name], Pam [name] 05/14/2021 1:15 PM Medical Record Number: [id number] Patient Account Number: [id number] Date of Birth/Sex: Treating RN: [birth year] (80 y.o. Female) Fonnie Mu Primary Care Dayshaun Whobrey: [name] Other Clinician: Referring Emberlyn Burlison: Treating Nyeem Stoke/Extender: Angus Palms Weeks in Treatment: 0 Edema  Assessment Assessed: [name]: Yes] [Right: No] Edema: [Left: Ye] [Right: s] Calf Left: Right: Point of Measurement: 35 cm From Medial Instep 36 cm Ankle Left: Right: Point of Measurement: 9 cm From Medial Instep 25 cm Knee To Floor Left: Right: From Medial Instep 41 cm Vascular Assessment Pulses: Dorsalis Pedis Palpable: [Left:Yes] Posterior Tibial Palpable: [Left:Yes] Blood Pressure: Brachial: [Left:150] Ankle: [Left:Dorsalis Pedis: 140] Ankle Brachial Index: [Left:0.93] Electronic Signature(s) Signed: 05/17/2021 5:37:32 PM By: Fonnie Mu RN Entered By: Fonnie Mu on 05/14/2021 14:22:31 -------------------------------------------------------------------------------- Multi Wound Chart Details Patient Name: Date of Service: Pam [name], Pam [name] 05/14/2021 1:15 PM Medical Record Number: [id number] Patient Account Number: [id number] Date of Birth/Sex: Treating RN: 08-27-1941 (80 y.o. Female) [name] Iba Primary Care Lonzell Dorris: [name] Other Clinician: Referring Minard Millirons: Treating Kalkidan Caudell/Extender: Angus Palms Weeks in Treatment: 0 Vital Signs Height(in): 64 Pulse(bpm): 64 Weight(lbs): 180 Blood Pressure(mmHg): 150/77 Body Mass Index(BMI): 31 Temperature(F): 97.9 Respiratory Rate(breaths/min): 17 Photos: [1:No Photos Left, Medial Lower Leg] [N/A:N/A N/A] Wound Location: [1:Gradually Appeared] [N/A:N/A] Wounding Event: [1:Venous Leg Ulcer] [N/A:N/A] Primary Etiology: [1:Cataracts, Glaucoma, Hypertension, N/A] Comorbid History: [1:Osteoarthritis 03/14/2021] [N/A:N/A] Date Acquired: [1:0] [N/A:N/A] Weeks of Treatment: [1:Open] [N/A:N/A] Wound Status: [1:6.5x9x0.1] [N/A:N/A] Measurements L x W x D (cm) [1:45.946] [N/A:N/A] A (cm) : rea [1:4.595] [N/A:N/A] Volume (cm) : [1:Full Thickness With Exposed Support N/A] Classification: [1:Structures Medium] [N/A:N/A] Exudate A mount: [1:Serosanguineous] [N/A:N/A] Exudate Type: [1:red, brown]  [N/A:N/A] Exudate Color: [1:Distinct, outline attached] [N/A:N/A] Wound Margin: [1:Medium (34-66%)] [N/A:N/A] Granulation A mount: [1:Red, Pink] [N/A:N/A] Granulation Quality: [1:Medium (34-66%)] [N/A:N/A] Necrotic A mount: [1:Fat Layer (Subcutaneous Tissue): Yes N/A] Exposed Structures: [1:Fascia: No Tendon: No Muscle: No Joint: No Bone: No None] [N/A:N/A] Epithelialization: [1:Debridement - Excisional] [N/A:N/A] Debridement: Pre-procedure Verification/Time Out 14:29 [N/A:N/A] Taken: [1:Lidocaine 4% Topical Solution] [N/A:N/A] Pain Control: [1:Subcutaneous, Slough] [N/A:N/A] Tissue Debrided: [1:Skin/Subcutaneous Tissue] [N/A:N/A] Level: [1:58.5] [N/A:N/A] Debridement A (sq cm): [1:rea Curette] [N/A:N/A] Instrument: [1:Minimum] [N/A:N/A] Bleeding: [1:Pressure] [N/A:N/A] Hemostasis A chieved: [1:Procedure was tolerated well] [N/A:N/A] Debridement Treatment Response: [1:6.5x9x0.1] [N/A:N/A] Post Debridement Measurements L x W x D (cm) [1:4.595] [N/A:N/A] Post Debridement Volume: (cm) [1:Debridement] [N/A:N/A] Treatment Notes Wound #1 (Lower Leg) Wound Laterality: Left, Medial Cleanser Wound Cleanser Discharge Instruction: Cleanse the wound with wound cleanser prior to applying a clean dressing using gauze sponges, not tissue or cotton balls. Soap and Water Discharge Instruction: May shower and wash wound with dial antibacterial soap and water prior to  dressing change. Peri-Wound Care Triamcinolone 15 (g) Discharge Instruction: T wound and periwound o Sween Lotion (Moisturizing lotion) Discharge Instruction: Apply to periwound Topical Primary Dressing IODOFLEX 0.9% Cadexomer Iodine Pad 4x6 cm Discharge Instruction: Apply to wound bed as instructed Secondary Dressing Woven Gauze Sponge, Non-Sterile 4x4 in Discharge Instruction: Apply over primary dressing as directed. ABD Pad, 8x10 Discharge Instruction: Apply over primary dressing as directed. Secured With Compression  Wrap FourPress (4 layer compression wrap) Discharge Instruction: Apply four layer compression as directed. May also use Miliken CoFlex 2 layer compression system as alternative. Compression Stockings Add-Ons Electronic Signature(s) Signed: 05/14/2021 6:03:14 PM By: [name] Iba Signed: 05/15/2021 7:13:41 AM By: Baltazar Najjar MD Entered By: Baltazar Najjar on 05/14/2021 15:21:51 -------------------------------------------------------------------------------- Multi-Disciplinary Care Plan Details Patient Name: Date of Service: Pam [name], Pam [name] 05/14/2021 1:15 PM Medical Record Number: [id number] Patient Account Number: [id number] Date of Birth/Sex: Treating RN: 1941/06/27 (80 y.o. Female) Fonnie Mu Primary Care Aniqua Briere: [name] Other Clinician: Referring Treniya Lobb: Treating Rainen Vanrossum/Extender: Angus Palms Weeks in Treatment: 0 Active Inactive Venous Leg Ulcer Nursing Diagnoses: Actual venous Insuffiency (use after diagnosis is confirmed) Goals: Patient will maintain optimal edema control Date Initiated: 05/14/2021 Target Resolution Date: 06/11/2021 Goal Status: Active Patient/caregiver will verbalize understanding of disease process and disease management Date Initiated: 05/14/2021 Target Resolution Date: 06/11/2021 Goal Status: Active Interventions: Assess peripheral edema status every visit. Compression as ordered Treatment Activities: Therapeutic compression applied : 05/14/2021 Notes: Wound/Skin Impairment Nursing Diagnoses: Impaired tissue integrity Goals: Patient/caregiver will verbalize understanding of skin care regimen Date Initiated: 05/14/2021 Target Resolution Date: 06/11/2021 Goal Status: Active Ulcer/skin breakdown will have a volume reduction of 30% by week 4 Date Initiated: 05/14/2021 Target Resolution Date: 06/11/2021 Goal Status: Active Interventions: Assess patient/caregiver ability to obtain necessary supplies Assess patient/caregiver  ability to perform ulcer/skin care regimen upon admission and as needed Assess ulceration(s) every visit Provide education on ulcer and skin care Treatment Activities: Skin care regimen initiated : 05/14/2021 Topical wound management initiated : 05/14/2021 Notes: Electronic Signature(s) Signed: 05/17/2021 5:37:32 PM By: Fonnie Mu RN Entered By: Fonnie Mu on 05/14/2021 14:36:21 -------------------------------------------------------------------------------- Pain Assessment Details Patient Name: Date of Service: Pam [name], Pam [name] 05/14/2021 1:15 PM Medical Record Number: [id number] Patient Account Number: [id number] Date of Birth/Sex: Treating RN: [birth year] (80 y.o. Female) Fonnie Mu Primary Care Javad Salva: [name] Other Clinician: Referring Jessica Seidman: Treating Carola Viramontes/Extender: Angus Palms Weeks in Treatment: 0 Active Problems Location of Pain Severity and Description of Pain Patient Has Paino No Site Locations Pain Management and Medication Current Pain Management: Electronic Signature(s) Signed: 05/17/2021 5:37:32 PM By: Fonnie Mu RN Entered By: Fonnie Mu on 05/14/2021 14:11:13 -------------------------------------------------------------------------------- Patient/Caregiver Education Details Patient Name: Date of Service: Pam Aline 6/3/2022andnbsp1:15 PM Medical Record Number: [id number] Patient Account Number: [id number] Date of Birth/Gender: Treating RN: 1941/03/02 (80 y.o. Female) Fonnie Mu Primary Care Physician: [name] Other Clinician: Referring Physician: Treating Physician/Extender: Burgess Estelle in Treatment: 0 Education Assessment Education Provided To: Patient Education Topics Provided Nutrition: Methods: Explain/Verbal, Printed Responses: State content correctly Venous: Methods: Explain/Verbal, Printed Responses: State content correctly Wound  Debridement: Methods: Explain/Verbal Responses: State content correctly Wound/Skin Impairment: Methods: Explain/Verbal, Printed Responses: State content correctly Electronic Signature(s) Signed: 05/17/2021 5:37:32 PM By: Fonnie Mu RN Entered By: Fonnie Mu on 05/14/2021 14:37:53 -------------------------------------------------------------------------------- Wound Assessment Details Patient Name: Date of Service: Pam [name], Pam [name] 05/14/2021 1:15 PM Medical Record Number: [id number] Patient Account Number: [id number] Date of Birth/Sex: Treating RN: 1941-10-03 (80 y.o. Female) Fonnie Mu  Primary Care Ronald Londo: Deatra JamesSun, [name] Other Clinician: Referring Choua Chalker: Treating Jamekia Gannett/Extender: Angus Palmsobson, Michael Sun, [name] Weeks in Treatment: 0 Wound Status Wound Number: 1 Primary Etiology: Venous Leg Ulcer Wound Location: Left, Medial Lower Leg Wound Status: Open Wounding Event: Gradually Appeared Comorbid History: Cataracts, Glaucoma, Hypertension, Osteoarthritis Date Acquired: 03/14/2021 Weeks Of Treatment: 0 Clustered Wound: No Photos Wound Measurements Length: (cm) 6.5 Width: (cm) 9 Depth: (cm) 0.1 Area: (cm) 45.946 Volume: (cm) 4.595 % Reduction in Area: 0% % Reduction in Volume: 0% Epithelialization: None Tunneling: No Undermining: No Wound Description Classification: Full Thickness With Exposed Support Structures Wound Margin: Distinct, outline attached Exudate Amount: Medium Exudate Type: Serosanguineous Exudate Color: red, brown Foul Odor After Cleansing: No Slough/Fibrino Yes Wound Bed Granulation Amount: Medium (34-66%) Exposed Structure Granulation Quality: Red, Pink Fascia Exposed: No Necrotic Amount: Medium (34-66%) Fat Layer (Subcutaneous Tissue) Exposed: Yes Necrotic Quality: Adherent Slough Tendon Exposed: No Muscle Exposed: No Joint Exposed: No Bone Exposed: No Treatment Notes Wound #1 (Lower Leg) Wound Laterality: Left,  Medial Cleanser Wound Cleanser Discharge Instruction: Cleanse the wound with wound cleanser prior to applying a clean dressing using gauze sponges, not tissue or cotton balls. Soap and Water Discharge Instruction: May shower and wash wound with dial antibacterial soap and water prior to dressing change. Peri-Wound Care Triamcinolone 15 (g) Discharge Instruction: T wound and periwound o Sween Lotion (Moisturizing lotion) Discharge Instruction: Apply to periwound Topical Primary Dressing IODOFLEX 0.9% Cadexomer Iodine Pad 4x6 cm Discharge Instruction: Apply to wound bed as instructed Secondary Dressing Woven Gauze Sponge, Non-Sterile 4x4 in Discharge Instruction: Apply over primary dressing as directed. ABD Pad, 8x10 Discharge Instruction: Apply over primary dressing as directed. Secured With Compression Wrap FourPress (4 layer compression wrap) Discharge Instruction: Apply four layer compression as directed. May also use Miliken CoFlex 2 layer compression system as alternative. Compression Stockings Add-Ons Electronic Signature(s) Signed: 05/14/2021 5:48:41 PM By: Karl Itoawkins, Destiny Signed: 05/17/2021 5:37:32 PM By: Fonnie MuBreedlove, Lauren RN Entered By: Karl Itoawkins, Destiny on 05/14/2021 17:16:27 -------------------------------------------------------------------------------- Vitals Details Patient Name: Date of Service: Pam [name], Pam [name] 05/14/2021 1:15 PM Medical Record Number: [id number] Patient Account Number: [id number] Date of Birth/Sex: Treating RN: 03-07-1941 (80 y.o. Female) Fonnie MuBreedlove, Lauren Primary Care Nalee Lightle: Deatra JamesSun, [name] Other Clinician: Referring Kenyia Wambolt: Treating Marvelle Caudill/Extender: Angus Palmsobson, Michael Sun, [name] Weeks in Treatment: 0 Vital Signs Time Taken: 14:04 Temperature (F): 97.9 Height (in): 64 Pulse (bpm): 64 Source: Stated Respiratory Rate (breaths/min): 17 Weight (lbs): 180 Blood Pressure (mmHg): 150/77 Source: Stated Reference Range: 80 - 120 mg / dl Body  Mass Index (BMI): 30.9 Electronic Signature(s) Signed: 05/17/2021 5:37:32 PM By: Fonnie MuBreedlove, Lauren RN Entered By: Fonnie MuBreedlove, Lauren on 05/14/2021 14:06:28

## 2021-05-17 NOTE — Progress Notes (Signed)
JOUD, PETTINATO ([id number]) Visit Report for 05/14/2021 Abuse/Suicide Risk Screen Details Patient Name: Date of Service: Pam [name], Pam [name] 05/14/2021 1:15 PM Medical Record Number: [id number] Patient Account Number: [id number] Date of Birth/Sex: Treating RN: 1941/08/10 (80 y.o. Female) Fonnie Mu Primary Care Brookie Wayment: [name] Other Clinician: Referring Americus Perkey: Treating Braydyn Schultes/Extender: Angus Palms Weeks in Treatment: 0 Abuse/Suicide Risk Screen Items Answer ABUSE RISK SCREEN: Has anyone close to you tried to hurt or harm you recentlyo No Do you feel uncomfortable with anyone in your familyo No Has anyone forced you do things that you didnt want to doo No Electronic Signature(s) Signed: 05/17/2021 5:37:32 PM By: Fonnie Mu RN Entered By: Fonnie Mu on 05/14/2021 14:10:01 -------------------------------------------------------------------------------- Activities of Daily Living Details Patient Name: Date of Service: Pam [name], Pam [name] 05/14/2021 1:15 PM Medical Record Number: [id number] Patient Account Number: [id number] Date of Birth/Sex: Treating RN: 1940-12-29 (80 y.o. Female) Fonnie Mu Primary Care Gurfateh Mcclain: [name] Other Clinician: Referring Deyonna Fitzsimmons: Treating Dallas Torok/Extender: Angus Palms Weeks in Treatment: 0 Activities of Daily Living Items Answer Activities of Daily Living (Please select one for each item) Drive Automobile Completely Able T Medications ake Completely Able Use T elephone Completely Able Care for Appearance Completely Able Use T oilet Completely Able Bath / Shower Completely Able Dress Self Completely Able Feed Self Completely Able Walk Completely Able Get In / Out Bed Completely Able Housework Completely Able Prepare Meals Completely Able Handle Money Completely Able Shop for Self Completely Able Electronic Signature(s) Signed: 05/17/2021 5:37:32 PM By: Fonnie Mu RN Entered By:  Fonnie Mu on 05/14/2021 14:10:22 -------------------------------------------------------------------------------- Education Screening Details Patient Name: Date of Service: Pam [name], Pam [name] 05/14/2021 1:15 PM Medical Record Number: [id number] Patient Account Number: [id number] Date of Birth/Sex: Treating RN: 1941/04/19 (80 y.o. Female) Fonnie Mu Primary Care Kendrell Lottman: [name] Other Clinician: Referring Landen Knoedler: Treating Marlia Schewe/Extender: Angus Palms Weeks in Treatment: 0 Primary Learner Assessed: Patient Learning Preferences/Education Level/Primary Language Learning Preference: Explanation, Demonstration, Communication Board Highest Education Level: College or Above Preferred Language: English Cognitive Barrier Language Barrier: No Translator Needed: No Memory Deficit: No Emotional Barrier: No Cultural/Religious Beliefs Affecting Medical Care: No Physical Barrier Impaired Vision: No Impaired Hearing: No Decreased Hand dexterity: No Knowledge/Comprehension Knowledge Level: High Comprehension Level: High Ability to understand written instructions: High Ability to understand verbal instructions: High Motivation Anxiety Level: Calm Cooperation: Cooperative Education Importance: Denies Need Interest in Health Problems: Asks Questions Perception: Coherent Willingness to Engage in Self-Management High Activities: Electronic Signature(s) Signed: 05/17/2021 5:37:32 PM By: Fonnie Mu RN Entered By: Fonnie Mu on 05/14/2021 14:10:52 -------------------------------------------------------------------------------- Fall Risk Assessment Details Patient Name: Date of Service: Pam [name], Pam [name] 05/14/2021 1:15 PM Medical Record Number: [id number] Patient Account Number: [id number] Date of Birth/Sex: Treating RN: 1941/02/06 (80 y.o. Female) Fonnie Mu Primary Care Annya Lizana: [name] Other Clinician: Referring Reeder Brisby: Treating  Delayza Lungren/Extender: Angus Palms Weeks in Treatment: 0 Fall Risk Assessment Items Have you had 2 or more falls in the last 12 monthso 0 No Have you had any fall that resulted in injury in the last 12 monthso 0 No FALLS RISK SCREEN History of falling - immediate or within 3 months 0 No Secondary diagnosis (Do you have 2 or more medical diagnoseso) 0 No Ambulatory aid None/bed rest/wheelchair/nurse 0 No Crutches/cane/walker 0 No Furniture 0 No Intravenous therapy Access/Saline/Heparin Lock 0 No Gait/Transferring Normal/ bed rest/ wheelchair 0 No Weak (short steps with or without shuffle, stooped but able to lift head while walking, may seek 0  No support from furniture) Impaired (short steps with shuffle, may have difficulty arising from chair, head down, impaired 0 No balance) Mental Status Oriented to own ability 0 No Electronic Signature(s) Signed: 05/17/2021 5:37:32 PM By: Fonnie Mu RN Entered By: Fonnie Mu on 05/14/2021 14:10:57 -------------------------------------------------------------------------------- Foot Assessment Details Patient Name: Date of Service: Pam [name], Pam [name] 05/14/2021 1:15 PM Medical Record Number: [id number] Patient Account Number: [id number] Date of Birth/Sex: Treating RN: 1941/10/10 (80 y.o. Female) Fonnie Mu Primary Care Analina Filla: [name] Other Clinician: Referring Lani Havlik: Treating Roylene Heaton/Extender: Angus Palms Weeks in Treatment: 0 Foot Assessment Items Site Locations + = Sensation present, - = Sensation absent, C = Callus, U = Ulcer R = Redness, W = Warmth, M = Maceration, PU = Pre-ulcerative lesion F = Fissure, S = Swelling, D = Dryness Assessment Right: Left: Other Deformity: No No Prior Foot Ulcer: No No Prior Amputation: No No Charcot Joint: No No Ambulatory Status: Ambulatory Without Help Gait: Steady Electronic Signature(s) Signed: 05/17/2021 5:37:32 PM By: Fonnie Mu  RN Entered By: Fonnie Mu on 05/14/2021 14:14:52 -------------------------------------------------------------------------------- Nutrition Risk Screening Details Patient Name: Date of Service: Pam [name], Pam [name] 05/14/2021 1:15 PM Medical Record Number: [id number] Patient Account Number: [id number] Date of Birth/Sex: Treating RN: January 14, 1941 (80 y.o. Female) Fonnie Mu Primary Care Zerrick Hanssen: [name] Other Clinician: Referring Colleena Kurtenbach: Treating Elmo Shumard/Extender: Angus Palms Weeks in Treatment: 0 Height (in): 64 Weight (lbs): 180 Body Mass Index (BMI): 30.9 Nutrition Risk Screening Items Score Screening NUTRITION RISK SCREEN: I have an illness or condition that made me change the kind and/or amount of food I eat 0 No I eat fewer than two meals per day 0 No I eat few fruits and vegetables, or milk products 0 No I have three or more drinks of beer, liquor or wine almost every day 0 No I have tooth or mouth problems that make it hard for me to eat 0 No I don't always have enough money to buy the food I need 0 No I eat alone most of the time 0 No I take three or more different prescribed or over-the-counter drugs a day 0 No Without wanting to, I have lost or gained 10 pounds in the last six months 0 No I am not always physically able to shop, cook and/or feed myself 0 No Nutrition Protocols Good Risk Protocol 0 No interventions needed Moderate Risk Protocol High Risk Proctocol Risk Level: Good Risk Score: 0 Electronic Signature(s) Signed: 05/17/2021 5:37:32 PM By: Fonnie Mu RN Entered By: Fonnie Mu on 05/14/2021 14:11:03

## 2021-05-18 ENCOUNTER — Other Ambulatory Visit: Payer: Self-pay | Admitting: Family Medicine

## 2021-05-18 DIAGNOSIS — E2839 Other primary ovarian failure: Secondary | ICD-10-CM

## 2021-05-21 ENCOUNTER — Other Ambulatory Visit: Payer: Self-pay

## 2021-05-21 ENCOUNTER — Encounter (HOSPITAL_BASED_OUTPATIENT_CLINIC_OR_DEPARTMENT_OTHER): Payer: BC Managed Care – PPO | Admitting: Internal Medicine

## 2021-05-21 DIAGNOSIS — I87332 Chronic venous hypertension (idiopathic) with ulcer and inflammation of left lower extremity: Secondary | ICD-10-CM | POA: Diagnosis not present

## 2021-05-21 NOTE — Progress Notes (Signed)
[name], [name] ([id number]) Visit Report for 05/21/2021 Arrival Information Details Patient Name: Date of Service: Pam [name], Pam [name] 05/21/2021 2:00 PM Medical Record Number: [id number] Patient Account Number: [id number] Date of Birth/Sex: Treating RN: Oct 01, 1941 (80 y.o. Pam [name] Primary Care Danijela Vessey: Pam [name] Other Clinician: Referring Liyla Radliff: Treating Meko Bellanger/Extender: Angus Palms Weeks in Treatment: 1 Visit Information History Since Last Visit Added or deleted any medications: No Patient Arrived: Ambulatory Any new allergies or adverse reactions: No Arrival Time: 14:02 Had a fall or experienced change in No Accompanied By: self activities of daily living that may affect Transfer Assistance: None risk of falls: Patient Identification Verified: Yes Signs or symptoms of abuse/neglect since last visito No Secondary Verification Process Completed: Yes Hospitalized since last visit: No Patient Requires Transmission-Based Precautions: No Implantable device outside of the clinic excluding No Patient Has Alerts: No cellular tissue based products placed in the center since last visit: Has Dressing in Place as Prescribed: Yes Pain Present Now: No Electronic Signature(s) Signed: 05/21/2021 3:50:24 PM By: Karl Ito Entered By: Karl Ito on 05/21/2021 14:02:44 -------------------------------------------------------------------------------- Compression Therapy Details Patient Name: Date of Service: Pam [name], Pam [name] 05/21/2021 2:00 PM Medical Record Number: [id number] Patient Account Number: [id number] Date of Birth/Sex: Treating RN: [birth year] (80 y.o. Pam [name] Primary Care Pam [name]: Pam [name] Other Clinician: Referring Kahari Critzer: Treating Erion Weightman/Extender: Angus Palms Weeks in Treatment: 1 Compression Therapy Performed for Wound Assessment: Wound #1 Left,Medial Lower Leg Performed By: Clinician [name] Iba,  RN Compression Type: Four Layer Post Procedure Diagnosis Same as Pre-procedure Electronic Signature(s) Signed: 05/21/2021 5:10:19 PM By: [name] Iba Entered By: [name] Iba on 05/21/2021 14:23:16 -------------------------------------------------------------------------------- Lower Extremity Assessment Details Patient Name: Date of Service: Pam [name], Pam [name] 05/21/2021 2:00 PM Medical Record Number: [id number] Patient Account Number: [id number] Date of Birth/Sex: Treating RN: December 10, 1941 (80 y.o. Pam [name] Primary Care Lonna Rabold: Pam [name] Other Clinician: Referring Osher Oettinger: Treating Kaylor Maiers/Extender: Angus Palms Weeks in Treatment: 1 Edema Assessment Assessed: [Left: Yes] [Right: No] Edema: [Left: Ye] [Right: s] Calf Left: Right: Point of Measurement: 35 cm From Medial Instep 35 cm Ankle Left: Right: Point of Measurement: 9 cm From Medial Instep 23 cm Vascular Assessment Pulses: Dorsalis Pedis Palpable: [Left:Yes] Electronic Signature(s) Signed: 05/21/2021 6:16:40 PM By: Shawn Stall Entered By: Shawn Stall on 05/21/2021 14:13:05 -------------------------------------------------------------------------------- Multi Wound Chart Details Patient Name: Date of Service: Pam [name], Pam [name] Pam [name] 05/21/2021 2:00 PM Medical Record Number: [id number] Patient Account Number: [id number] Date of Birth/Sex: Treating RN: [birth year] (80 y.o. Pam [name] Primary Care Pam [name]: Pam [name] Other Clinician: Referring Meilech Virts: Treating Xana Bradt/Extender: Angus Palms Weeks in Treatment: 1 Vital Signs Height(in): 64 Pulse(bpm): 74 Weight(lbs): 180 Blood Pressure(mmHg): 146/77 Body Mass Index(BMI): 31 Temperature(F): 97.9 Respiratory Rate(breaths/min): 17 Photos: [1:No Photos Left, Medial Lower Leg] [N/A:N/A N/A] Wound Location: [1:Gradually Appeared] [N/A:N/A] Wounding Event: [1:Venous Leg Ulcer] [N/A:N/A] Primary Etiology:  [1:Cataracts, Glaucoma, Hypertension,] [N/A:N/A] Comorbid History: [1:Osteoarthritis 03/14/2021] [N/A:N/A] Date Acquired: [1:1] [N/A:N/A] Weeks of Treatment: [1:Open] [N/A:N/A] Wound Status: [1:4.1x5.7x0.1] [N/A:N/A] Measurements L x W x D (cm) [1:18.355] [N/A:N/A] A (cm) : rea [1:1.835] [N/A:N/A] Volume (cm) : [1:60.10%] [N/A:N/A] % Reduction in Area: [1:60.10%] [N/A:N/A] % Reduction in Volume: [1:Full Thickness With Exposed Support] [N/A:N/A] Classification: [1:Structures Medium] [N/A:N/A] Exudate Amount: [1:Serosanguineous] [N/A:N/A] Exudate Type: [1:red, brown] [N/A:N/A] Exudate Color: [1:Distinct, outline attached] [N/A:N/A] Wound Margin: [1:Medium (34-66%)] [N/A:N/A] Granulation Amount: [1:Red, Pink] [N/A:N/A] Granulation Quality: [1:Medium (34-66%)] [N/A:N/A] Necrotic Amount: [1:Fat Layer (Subcutaneous Tissue): Yes N/A] Exposed Structures: [1:Fascia: No Tendon:  No Muscle: No Joint: No Bone: No Small (1-33%)] [N/A:N/A] Epithelialization: [1:Debridement - Selective/Open Wound N/A] Debridement: Pre-procedure Verification/Time Out 14:17 [N/A:N/A] Taken: [1:Other] [N/A:N/A] Pain Control: [1:Slough] [N/A:N/A] Tissue Debrided: [1:Non-Viable Tissue] [N/A:N/A] Level: [1:23.37] [N/A:N/A] Debridement A (sq cm): [1:rea Curette] [N/A:N/A] Instrument: [1:Minimum] [N/A:N/A] Bleeding: [1:Pressure] [N/A:N/A] Hemostasis A chieved: [1:Procedure was tolerated well] [N/A:N/A] Debridement Treatment Response: [1:4.1x5.7x0.1] [N/A:N/A] Post Debridement Measurements L x W x D (cm) [1:1.835] [N/A:N/A] Post Debridement Volume: (cm) [1:Compression Therapy] [N/A:N/A] Procedures Performed: [1:Debridement] Treatment Notes Electronic Signature(s) Signed: 05/21/2021 5:10:19 PM By: [name] Iba Signed: 05/21/2021 5:16:47 PM By: Baltazar Najjar MD Entered By: Baltazar Najjar on 05/21/2021 14:40:08 -------------------------------------------------------------------------------- Multi-Disciplinary  Care Plan Details Patient Name: Date of Service: Pam [name], Pam [name] Pam [name] 05/21/2021 2:00 PM Medical Record Number: [id number] Patient Account Number: [id number] Date of Birth/Sex: Treating RN: 1941/09/19 (80 y.o. Pam [name] Primary Care Gelsey Amyx: Pam [name] Other Clinician: Referring Kayan Blissett: Treating Gibson Lad/Extender: Angus Palms Weeks in Treatment: 1 Active Inactive Venous Leg Ulcer Nursing Diagnoses: Actual venous Insuffiency (use after diagnosis is confirmed) Goals: Patient will maintain optimal edema control Date Initiated: 05/14/2021 Target Resolution Date: 06/11/2021 Goal Status: Active Patient/caregiver will verbalize understanding of disease process and disease management Date Initiated: 05/14/2021 Target Resolution Date: 06/11/2021 Goal Status: Active Interventions: Assess peripheral edema status every visit. Compression as ordered Treatment Activities: Therapeutic compression applied : 05/14/2021 Notes: Wound/Skin Impairment Nursing Diagnoses: Impaired tissue integrity Goals: Patient/caregiver will verbalize understanding of skin care regimen Date Initiated: 05/14/2021 Target Resolution Date: 06/11/2021 Goal Status: Active Ulcer/skin breakdown will have a volume reduction of 30% by week 4 Date Initiated: 05/14/2021 Target Resolution Date: 06/11/2021 Goal Status: Active Interventions: Assess patient/caregiver ability to obtain necessary supplies Assess patient/caregiver ability to perform ulcer/skin care regimen upon admission and as needed Assess ulceration(s) every visit Provide education on ulcer and skin care Treatment Activities: Skin care regimen initiated : 05/14/2021 Topical wound management initiated : 05/14/2021 Notes: Electronic Signature(s) Signed: 05/21/2021 5:10:19 PM By: [name] Iba Entered By: [name] Iba on 05/21/2021 14:20:13 -------------------------------------------------------------------------------- Pain Assessment  Details Patient Name: Date of Service: Pam [name], Pam [name] 05/21/2021 2:00 PM Medical Record Number: [id number] Patient Account Number: [id number] Date of Birth/Sex: Treating RN: November 14, 1941 (80 y.o. Pam [name] Primary Care Tryson Lumley: Pam [name] Other Clinician: Referring Noralee Dutko: Treating Fatime Biswell/Extender: Angus Palms Weeks in Treatment: 1 Active Problems Location of Pain Severity and Description of Pain Patient Has Paino No Site Locations Pain Management and Medication Current Pain Management: Electronic Signature(s) Signed: 05/21/2021 3:50:24 PM By: Karl Ito Signed: 05/21/2021 5:10:19 PM By: [name] Iba Entered By: Karl Ito on 05/21/2021 14:03:07 -------------------------------------------------------------------------------- Patient/Caregiver Education Details Patient Name: Date of Service: Pam [name], Pam [name] 6/10/2022andnbsp2:00 PM Medical Record Number: [id number] Patient Account Number: [id number] Date of Birth/Gender: Treating RN: Jun 16, 1941 (80 y.o. Pam [name] Primary Care Physician: Pam [name] Other Clinician: Referring Physician: Treating Physician/Extender: Burgess Estelle in Treatment: 1 Education Assessment Education Provided To: Patient Education Topics Provided Venous: Methods: Explain/Verbal, Printed Responses: State content correctly Wound/Skin Impairment: Methods: Explain/Verbal, Printed Responses: State content correctly Electronic Signature(s) Signed: 05/21/2021 5:10:19 PM By: [name] Iba Entered By: [name] Iba on 05/21/2021 14:20:40 -------------------------------------------------------------------------------- Wound Assessment Details Patient Name: Date of Service: Pam [name], Pam [name] Pam [name] 05/21/2021 2:00 PM Medical Record Number: [id number] Patient Account Number: [id number] Date of Birth/Sex: Treating RN: March 13, 1941 (80 y.o. Pam [name] Primary Care Lauralie Blacksher: Pam [name] Other  Clinician: Referring Emelee Rodocker: Treating Shawnetta Lein/Extender: Angus Palms Weeks in Treatment: 1 Wound Status Wound Number: 1 Primary  Etiology: Venous Leg Ulcer Wound Location: Left, Medial Lower Leg Wound Status: Open Wounding Event: Gradually Appeared Comorbid History: Cataracts, Glaucoma, Hypertension, Osteoarthritis Date Acquired: 03/14/2021 Weeks Of Treatment: 1 Clustered Wound: No Photos Wound Measurements Length: (cm) 4.1 Width: (cm) 5.7 Depth: (cm) 0.1 Area: (cm) 18.355 Volume: (cm) 1.835 % Reduction in Area: 60.1% % Reduction in Volume: 60.1% Epithelialization: Small (1-33%) Tunneling: No Undermining: No Wound Description Classification: Full Thickness With Exposed Support Structures Wound Margin: Distinct, outline attached Exudate Amount: Medium Exudate Type: Serosanguineous Exudate Color: red, brown Foul Odor After Cleansing: No Slough/Fibrino Yes Wound Bed Granulation Amount: Medium (34-66%) Exposed Structure Granulation Quality: Red, Pink Fascia Exposed: No Necrotic Amount: Medium (34-66%) Fat Layer (Subcutaneous Tissue) Exposed: Yes Necrotic Quality: Adherent Slough Tendon Exposed: No Muscle Exposed: No Joint Exposed: No Bone Exposed: No Electronic Signature(s) Signed: 05/21/2021 4:58:44 PM By: Karl Ito Signed: 05/21/2021 6:16:40 PM By: Shawn Stall Entered By: Karl Ito on 05/21/2021 16:46:57 -------------------------------------------------------------------------------- Vitals Details Patient Name: Date of Service: Pam [name], Pam [name] Pam [name] 05/21/2021 2:00 PM Medical Record Number: [id number] Patient Account Number: [id number] Date of Birth/Sex: Treating RN: [birth year] (80 y.o. Pam [name] Primary Care Nils Thor: Pam [name] Other Clinician: Referring Jae Skeet: Treating Thresa Dozier/Extender: Angus Palms Weeks in Treatment: 1 Vital Signs Time Taken: 02:02 Temperature (F): 97.9 Height (in): 64 Pulse  (bpm): 74 Weight (lbs): 180 Respiratory Rate (breaths/min): 17 Body Mass Index (BMI): 30.9 Blood Pressure (mmHg): 146/77 Reference Range: 80 - 120 mg / dl Electronic Signature(s) Signed: 05/21/2021 3:50:24 PM By: Karl Ito Entered By: Karl Ito on 05/21/2021 14:02:58

## 2021-05-21 NOTE — Progress Notes (Signed)
APPLE, DEARMAS ([id number]) Visit Report for 05/21/2021 Debridement Details Patient Name: Date of Service: Pam [name], Pam [name] 05/21/2021 2:00 PM Medical Record Number: [id number] Patient Account Number: [id number] Date of Birth/Sex: Treating RN: May 01, 1941 (80 y.o. Pam [name] Primary Care Provider: [name] Other Clinician: Referring Provider: Treating Provider/Extender: Angus Palms Weeks in Treatment: 1 Debridement Performed for Assessment: Wound #1 Left,Medial Lower Leg Performed By: Physician Maxwell Caul., MD Debridement Type: Debridement Severity of Tissue Pre Debridement: Fat layer exposed Level of Consciousness (Pre-procedure): Awake and Alert Pre-procedure Verification/Time Out Yes - 14:17 Taken: Start Time: 14:18 Pain Control: Other : Benzocaine T Area Debrided (L x W): otal 4.1 (cm) x 5.7 (cm) = 23.37 (cm) Tissue and other material debrided: Non-Viable, Slough, Subcutaneous, Slough Level: Skin/Subcutaneous Tissue Debridement Description: Excisional Instrument: Curette Bleeding: Minimum Hemostasis Achieved: Pressure End Time: 14:22 Response to Treatment: Procedure was tolerated well Level of Consciousness (Post- Awake and Alert procedure): Post Debridement Measurements of Total Wound Length: (cm) 4.1 Width: (cm) 5.7 Depth: (cm) 0.1 Volume: (cm) 1.835 Character of Wound/Ulcer Post Debridement: Stable Severity of Tissue Post Debridement: Fat layer exposed Post Procedure Diagnosis Same as Pre-procedure Electronic Signature(s) Signed: 05/21/2021 5:10:19 PM By: [name] Iba Signed: 05/21/2021 5:16:47 PM By: Baltazar Najjar MD Entered By: Baltazar Najjar on 05/21/2021 14:40:49 -------------------------------------------------------------------------------- HPI Details Patient Name: Date of Service: Pam [name], Pam [name] 05/21/2021 2:00 PM Medical Record Number: [id number] Patient Account Number: [id number] Date of Birth/Sex: Treating  RN: 02-19-1941 (80 y.o. Pam [name] Primary Care Provider: [name] Other Clinician: Referring Provider: Treating Provider/Extender: Angus Palms Weeks in Treatment: 1 History of Present Illness HPI Description: ADMISSION 05/14/2021 with This is a 80 year old reasonably healthy woman who is still working at [employer] and [employer] and about to retire at the end of the month. She has had a problem with lower extremity edema for about the last 6 months she tells me. 2 months ago she noted small bumps on both of her legs however the left one medially morphed into a large blister. She was seen by her primary physician Dr. [name] on 04/19/2021. Put on doxycycline. He noted at that time that the wound had been weeping for the last 5 weeks. The patient does not have a wound history. 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 We used Iodoflex under compression last week. She does not appear to have an arterial issue Electronic Signature(s) Signed: 05/21/2021 5:16:47 PM By: Baltazar Najjar MD Entered By: Baltazar Najjar on 05/21/2021 14:41:26 -------------------------------------------------------------------------------- Physical Exam Details Patient Name: Date of Service: Pam [name], Pam [name] 05/21/2021 2:00 PM Medical Record Number: [id number] Patient Account Number: [id number] Date of Birth/Sex: Treating RN: 05/03/1941 (80 y.o. Pam [name] Primary Care Provider: [name] Other Clinician: Referring Provider: Treating Provider/Extender: Angus Palms Weeks in Treatment: 1 Cardiovascular Pedal pulses are palpable. Edema present in both extremities.. Notes Wound exam; left medial lower leg. This underwent  debridement last week but developed required a debridement this week as well I used an open curette very fibrinous debris on the surface nonviable subcutaneous tissue underneath I removed all of this with an open curette. Hemostasis with direct pressure. Surface of the wound looks better Electronic Signature(s) Signed: 05/21/2021 5:16:47 PM By: Leanord Hawking,  Casimiro NeedleMichael MD Entered By: [name], Dyane Broberg on 05/21/2021 14:43:07 -------------------------------------------------------------------------------- Physician Orders Details Patient Name: Date of Service: [name], Pam [name] 05/21/2021 2:00 PM Medical Record Number: [id number] Patient Account Number: [id number] Date of Birth/Sex: Treating RN: 1941/11/22 (80 y.o. Pam CluckF) Barnhart, Jodi Primary Care Provider: Deatra JamesSun, [name] Other Clinician: Referring Provider: Treating Provider/Extender: Angus Palmsobson, Edeline Greening Sun, [name] Weeks in Treatment: 1 Verbal / Phone Orders: No Diagnosis Coding ICD-10 Coding Code Description [phone] Chronic venous hypertension (idiopathic) with ulcer and inflammation of left lower extremity L97.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 of skin Follow-up Appointments ppointment in 1 week. - with Dr. Leanord Hawkingobson Return A Bathing/ Shower/ Hygiene May shower with protection but do not get wound dressing(s) wet. Edema Control - Lymphedema / SCD / Other Avoid standing for long periods of time. Exercise regularly Other Edema Control Orders/Instructions: - Whenever sitting elevate legs at least to level of belly button. Additional Orders / Instructions Follow Nutritious Diet Wound Treatment Wound #1 - Lower Leg Wound Laterality: Left, Medial Cleanser: Soap and Water 1 x Per Week/30 Days Discharge Instructions: May shower and wash wound with dial antibacterial soap and water prior to dressing change. Cleanser: Wound Cleanser 1 x Per Week/30 Days Discharge Instructions: Cleanse the wound with wound cleanser  prior to applying a clean dressing using gauze sponges, not tissue or cotton balls. Peri-Wound Care: Triamcinolone 15 (g) 1 x Per Week/30 Days Discharge Instructions: T wound and periwound o Peri-Wound Care: Sween Lotion (Moisturizing lotion) 1 x Per Week/30 Days Discharge Instructions: Apply to periwound Prim Dressing: IODOFLEX 0.9% Cadexomer Iodine Pad 4x6 cm 1 x Per Week/30 Days ary Discharge Instructions: Apply to wound bed as instructed Secondary Dressing: Woven Gauze Sponge, Non-Sterile 4x4 in 1 x Per Week/30 Days Discharge Instructions: Apply over primary dressing as directed. Secondary Dressing: ABD Pad, 8x10 1 x Per Week/30 Days Discharge Instructions: Apply over primary dressing as directed. Compression Wrap: FourPress (4 layer compression wrap) 1 x Per Week/30 Days Discharge Instructions: Apply four layer compression as directed. May also use Miliken CoFlex 2 layer compression system as alternative. Electronic Signature(s) Signed: 05/21/2021 5:10:19 PM By: [name] IbaBarnhart, Jodi Signed: 05/21/2021 5:16:47 PM By: [name], Perle Brickhouse MD Previous Signature: 05/21/2021 2:19:13 PM Version By: [name] IbaBarnhart, Jodi Entered By: [name] IbaBarnhart, Jodi on 05/21/2021 14:23:51 -------------------------------------------------------------------------------- Problem List Details Patient Name: Date of Service: Pam [name], Pam [name] 05/21/2021 2:00 PM Medical Record Number: [id number] Patient Account Number: [id number] Date of Birth/Sex: Treating RN: 1941/11/22 (80 y.o. Pam CluckF) Barnhart, Jodi Primary Care Provider: Deatra JamesSun, [name] Other Clinician: Referring Provider: Treating Provider/Extender: Angus Palmsobson, Carrol Hougland Sun, [name] Weeks in Treatment: 1 Active Problems ICD-10 Encounter Code Description Active Date MDM Diagnosis I87.332 Chronic venous hypertension (idiopathic) with ulcer and inflammation of left 05/14/2021 No Yes lower extremity L97.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  05/14/2021 No Yes of skin Inactive Problems Resolved Problems Electronic Signature(s) Signed: 05/21/2021 5:16:47 PM By: [name], Elon Lomeli MD Previous Signature: 05/21/2021 2:18:45 PM Version By: [name] IbaBarnhart, Jodi Entered By: [name], Jayden Kratochvil on 05/21/2021 14:40:02 -------------------------------------------------------------------------------- Progress Note Details Patient Name: Date of Service: Pam [name], Pam [name] 05/21/2021 2:00 PM Medical Record Number: [id number] Patient Account Number: [id number] Date of Birth/Sex: Treating RN: 1941/11/22 (80 y.o. Pam CluckF) Barnhart, Jodi Primary Care Provider: Deatra JamesSun, [name] Other Clinician: Referring Provider: Treating Provider/Extender: Angus Palmsobson, Aryanah Enslow Sun, [name] Weeks in Treatment: 1 Subjective History of Present Illness (HPI) ADMISSION 05/14/2021 with This is a 80 year old reasonably healthy woman who is still working at [employer]  and [employer] and about to retire at the end of the month. She has had a problem with lower extremity edema for about the last 6 months she tells me. 2 months ago she noted small bumps on both of her legs however the left one medially morphed into a large blister. She was seen by her primary physician Dr. [name] on 04/19/2021. Put on doxycycline. He noted at that time that the wound had been weeping for the last 5 weeks. The patient does not have a wound history. 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 We used Iodoflex under compression last week. She does not appear to have an arterial issue Objective Constitutional Vitals Time Taken: 2:02 AM, Height: 64 in, Weight: 180 lbs, BMI: 30.9, Temperature: 97.9 F, Pulse: 74 bpm, Respiratory Rate: 17 breaths/min, Blood Pressure: 146/77  mmHg. Cardiovascular Pedal pulses are palpable. Edema present in both extremities.. General Notes: Wound exam; left medial lower leg. This underwent debridement last week but developed required a debridement this week as well I used an open curette very fibrinous debris on the surface nonviable subcutaneous tissue underneath I removed all of this with an open curette. Hemostasis with direct pressure. Surface of the wound looks better Integumentary (Hair, Skin) Wound #1 status is Open. Original cause of wound was Gradually Appeared. The date acquired was: 03/14/2021. The wound has been in treatment 1 weeks. The wound is located on the Left,Medial Lower Leg. The wound measures 4.1cm length x 5.7cm width x 0.1cm depth; 18.355cm^2 area and 1.835cm^3 volume. There is Fat Layer (Subcutaneous Tissue) exposed. There is no tunneling or undermining noted. There is a medium amount of serosanguineous drainage noted. The wound margin is distinct with the outline attached to the wound base. There is medium (34-66%) red, pink granulation within the wound bed. There is a medium (34-66%) amount of necrotic tissue within the wound bed including Adherent Slough. 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 There was a Excisional Skin/Subcutaneous Tissue Debridement with a total area of 23.37 sq cm performed by Maxwell Caul., MD. With the following instrument(s): Curette to remove Non-Viable tissue/material. Material removed includes Subcutaneous Tissue and Slough and after achieving pain control using Other (Benzocaine). No specimens were taken. A time out was conducted at  14:17, prior to the start of the procedure. A Minimum amount of bleeding was controlled with Pressure. The procedure was tolerated well. Post Debridement Measurements: 4.1cm length x 5.7cm width x 0.1cm depth; 1.835cm^3 volume. Character of Wound/Ulcer Post Debridement is stable. Severity of Tissue Post Debridement is: Fat layer exposed. Post procedure Diagnosis Wound #1: Same as Pre-Procedure Pre-procedure diagnosis of Wound #1 is a Venous Leg Ulcer located on the Left,Medial Lower Leg . There was a Four Layer Compression Therapy Procedure by [name] Iba, RN. Post procedure Diagnosis Wound #1: Same as Pre-Procedure Plan Follow-up Appointments: Return Appointment in 1 week. - with Dr. Chauncey Mann Shower/ Hygiene: May shower with protection but do  not get wound dressing(s) wet. Edema Control - Lymphedema / SCD / Other: Avoid standing for long periods of time. Exercise regularly Other Edema Control Orders/Instructions: - Whenever sitting elevate legs at least to level of belly button. Additional Orders / Instructions: Follow Nutritious Diet WOUND #1: - Lower Leg Wound Laterality: Left, Medial Cleanser: Soap and Water 1 x Per Week/30 Days Discharge Instructions: May shower and wash wound with dial antibacterial soap and water prior to dressing change. Cleanser: Wound Cleanser 1 x Per Week/30 Days Discharge Instructions: Cleanse the wound with wound cleanser prior to applying a clean dressing using gauze sponges, not tissue or cotton balls. Peri-Wound Care: Triamcinolone 15 (g) 1 x Per Week/30 Days Discharge Instructions: T wound and periwound o Peri-Wound Care: Sween Lotion (Moisturizing lotion) 1 x Per Week/30 Days Discharge Instructions: Apply to periwound Prim Dressing: IODOFLEX 0.9% Cadexomer Iodine Pad 4x6 cm 1 x Per Week/30 Days ary Discharge Instructions: Apply to wound bed as instructed Secondary Dressing: Woven Gauze Sponge, Non-Sterile 4x4 in 1 x Per Week/30  Days Discharge Instructions: Apply over primary dressing as directed. Secondary Dressing: ABD Pad, 8x10 1 x Per Week/30 Days Discharge Instructions: Apply over primary dressing as directed. Com pression Wrap: FourPress (4 layer compression wrap) 1 x Per Week/30 Days Discharge Instructions: Apply four layer compression as directed. May also use Miliken CoFlex 2 layer compression system as alternative. 1. Aggressive debridement again today. Hopefully we are getting to a more viable surface 2. Still using Iodoflex to help with ongoing debridement 3. Looking towards Apligraf when I can get the surface of this to a better state 4. For this week still Iodoflex under compression 4-layer which she is tolerated well Electronic Signature(s) Signed: 05/21/2021 5:16:47 PM By: Baltazar Najjar MD Entered By: Baltazar Najjar on 05/21/2021 14:43:57 -------------------------------------------------------------------------------- SuperBill Details Patient Name: Date of Service: Pam [name], Pam [name] 05/21/2021 Medical Record Number: [id number] Patient Account Number: [id number] Date of Birth/Sex: Treating RN: 1941/09/03 (80 y.o. Pam [name] Primary Care Provider: [name] Other Clinician: Referring Provider: Treating Provider/Extender: Angus Palms Weeks in Treatment: 1 Diagnosis Coding ICD-10 Codes Code Description [phone] Chronic venous hypertension (idiopathic) with ulcer and inflammation of left lower extremity L97.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 of skin Facility Procedures CPT4 Code: [id number] Description: 11042 - DEB SUBQ TISSUE 20 SQ CM/< ICD-10 Diagnosis Description L97.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 Modifier: of skin Quantity: 1 CPT4 Code: [id number] Description: 11045 - DEB SUBQ TISS EA ADDL 20CM ICD-10 Diagnosis Description L97.821 Non-pressure chronic ulcer of other  part of left lower leg limited to breakdown Modifier: of skin Quantity: 1 Physician Procedures : CPT4 Code Description Modifier [id number] 11042 - WC PHYS SUBQ TISS 20 SQ CM ICD-10 Diagnosis Description L97.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 of skin Quantity: 1 : [id number] 11045 - WC PHYS SUBQ TISS EA ADDL 20 CM ICD-10 Diagnosis Description L97.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 of skin Quantity: 1 Electronic Signature(s) Signed: 05/21/2021 5:16:47 PM By: Baltazar Najjar MD Entered By: Baltazar Najjar on 05/21/2021 14:44:22

## 2021-05-28 ENCOUNTER — Encounter (HOSPITAL_BASED_OUTPATIENT_CLINIC_OR_DEPARTMENT_OTHER): Payer: BC Managed Care – PPO | Admitting: Internal Medicine

## 2021-05-28 ENCOUNTER — Other Ambulatory Visit: Payer: Self-pay

## 2021-05-28 DIAGNOSIS — I87332 Chronic venous hypertension (idiopathic) with ulcer and inflammation of left lower extremity: Secondary | ICD-10-CM | POA: Diagnosis not present

## 2021-05-28 NOTE — Progress Notes (Signed)
Pam [name] ([id number]) Visit Report for 05/28/2021 Debridement Details Patient Name: Date of Service: Pam [name], Pam [name] 05/28/2021 1:45 PM Medical Record Number: [id number] Patient Account Number: [id number] Date of Birth/Sex: Treating RN: 03-30-1941 (80 y.o. Pam [name] Primary Care Provider: Deatra [name] Other Clinician: Referring Provider: Treating Provider/Extender: Pam [name] Weeks in Treatment: 2 Debridement Performed for Assessment: Wound #1 Left,Medial Lower Leg Performed By: Physician Pam Caul., MD Debridement Type: Debridement Severity of Tissue Pre Debridement: Fat layer exposed Level of Consciousness (Pre-procedure): Awake and Alert Pre-procedure Verification/Time Out Yes - 14:44 Taken: Start Time: 14:45 Pain Control: Other : Benzocaine T Area Debrided (L x W): otal 4.3 (cm) x 5 (cm) = 21.5 (cm) Tissue and other material debrided: Non-Viable, Slough, Subcutaneous, Skin: Dermis , Slough Level: Skin/Subcutaneous Tissue Debridement Description: Excisional Instrument: Curette Bleeding: Moderate Hemostasis Achieved: Pressure End Time: 14:52 Response to Treatment: Procedure was tolerated well Level of Consciousness (Post- Awake and Alert procedure): Post Debridement Measurements of Total Wound Length: (cm) 4.3 Width: (cm) 5 Depth: (cm) 0.1 Volume: (cm) 1.689 Character of Wound/Ulcer Post Debridement: Stable Severity of Tissue Post Debridement: Fat layer exposed Post Procedure Diagnosis Same as Pre-procedure Electronic Signature(s) Signed: 05/28/2021 5:27:23 PM By: Pam Najjar MD Signed: 05/28/2021 5:38:00 PM By: Pam [name] Entered By: Pam [name] on 05/28/2021 15:20:15 -------------------------------------------------------------------------------- HPI Details Patient Name: Date of Service: Pam [name] [name] 05/28/2021 1:45 PM Medical Record Number: [id number] Patient Account Number: [id number] Date of Birth/Sex:  Treating RN: 1941/01/11 (80 y.o. Pam [name] Primary Care Provider: Deatra [name] Other Clinician: Referring Provider: Treating Provider/Extender: Pam [name] Weeks in Treatment: 2 History of Present Illness HPI Description: ADMISSION 05/14/2021 with This is a 80 year old reasonably healthy woman who is still working at [employer] and about to retire at the end of the month. She has had a problem with lower extremity edema for about the last 6 months she tells me. 2 months ago she noted small bumps on both of her legs however the left one medially morphed into a large blister. She was seen by her primary physician Dr. [name] on 04/19/2021. Put on doxycycline. He noted at that time that the wound had been weeping for the last 5 weeks. The patient does not have a wound history. 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 We used Iodoflex under compression last week. 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 We have been using Iodoflex to clean up the wound surface. She came in with drainage. Electronic Signature(s) Signed: 05/28/2021 5:27:23 PM By: Pam Najjar MD Entered By: Pam [name] on 05/28/2021 15:18:19 -------------------------------------------------------------------------------- Physical Exam Details Patient Name: Date of Service: Pam [name], Pam [name] 05/28/2021 1:45 PM Medical Record Number: [id number] Patient Account Number: [id number] Date of Birth/Sex: Treating RN: [birth year] (80 y.o. Pam [name] Primary Care Provider: Deatra [name] Other Clinician: Referring Provider: Treating Provider/Extender:  Pam [name] Weeks in Treatment: 2 Notes Wound exam; left medial lower leg. Better looking wound surface but still a large amount of nonviable adherent material. I used a #5 curette to clean up the surface of this into subcutaneous tissue hemostasis with a  pressure dressing. Also dry skin from the wound circumference. There is no evidence of surrounding infection Electronic Signature(s) Signed: 05/28/2021 5:27:23 PM By: Pam Najjarobson, Pam Marmol MD Entered By: Pam Najjarobson, Pam [name] on 05/28/2021 15:19:03 -------------------------------------------------------------------------------- Physician Orders Details Patient Name: Date of Service: Pam [name], Pam [name] 05/28/2021 1:45 PM Medical Record Number: [id number] Patient Account Number: [id number] Date of Birth/Sex: Treating RN: 1940/12/28 (80 y.o. Pam CluckF) [name], Pam Primary Care Provider: Deatra JamesSun, Pam Other Clinician: Referring Provider: Treating Provider/Extender: Pam Palmsobson, Pam [name] [name], Pam Weeks in Treatment: 2 Verbal / Phone Orders: No Diagnosis Coding ICD-10 Coding Code Description [phone] Chronic venous hypertension (idiopathic) with ulcer and inflammation of left lower extremity L97.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 of skin Follow-up Appointments ppointment in 1 week. - with Dr. Leanord Hawkingobson Return A Bathing/ Shower/ Hygiene May shower with protection but do not get wound dressing(s) wet. Edema Control - Lymphedema / SCD / Other Avoid standing for long periods of time. Exercise regularly Other Edema Control Orders/Instructions: - Whenever sitting elevate legs at least to level of belly button. Additional Orders / Instructions Follow Nutritious Diet Wound Treatment Wound #1 - Lower Leg Wound Laterality: Left, Medial Cleanser: Soap and Water 1 x Per Week/30 Days Discharge Instructions: May shower and wash wound with dial antibacterial soap and water prior to dressing change. Cleanser:  Wound Cleanser 1 x Per Week/30 Days Discharge Instructions: Cleanse the wound with wound cleanser prior to applying a clean dressing using gauze sponges, not tissue or cotton balls. Peri-Wound Care: Triamcinolone 15 (g) 1 x Per Week/30 Days Discharge Instructions: T wound and periwound o Peri-Wound Care: Sween Lotion (Moisturizing lotion) 1 x Per Week/30 Days Discharge Instructions: Apply to periwound Prim Dressing: IODOFLEX 0.9% Cadexomer Iodine Pad 4x6 cm 1 x Per Week/30 Days ary Discharge Instructions: Apply to wound bed as instructed Secondary Dressing: Woven Gauze Sponge, Non-Sterile 4x4 in 1 x Per Week/30 Days Discharge Instructions: Apply over primary dressing as directed. Secondary Dressing: ABD Pad, 8x10 1 x Per Week/30 Days Discharge Instructions: Apply over primary dressing as directed. Compression Wrap: FourPress (4 layer compression wrap) 1 x Per Week/30 Days Discharge Instructions: Apply four layer compression as directed. May also use Miliken CoFlex 2 layer compression system as alternative. Electronic Signature(s) Signed: 05/28/2021 5:27:23 PM By: Pam Najjarobson, Khriz Liddy MD Signed: 05/28/2021 5:38:00 PM By: Pam IbaBarnhart, Pam Entered By: Pam IbaBarnhart, Pam on 05/28/2021 14:53:22 -------------------------------------------------------------------------------- Problem List Details Patient Name: Date of Service: Pam [name], Pam [name] 05/28/2021 1:45 PM Medical Record Number: [id number] Patient Account Number: [id number] Date of Birth/Sex: Treating RN: 1940/12/28 (80 y.o. Pam CluckF) [name], Pam Primary Care Provider: Deatra JamesSun, Pam Other Clinician: Referring Provider: Treating Provider/Extender: Pam Palmsobson, Jamine Highfill [name], Pam Weeks in Treatment: 2 Active Problems ICD-10 Encounter Code Description Active Date MDM Diagnosis I87.332 Chronic venous hypertension (idiopathic) with ulcer and inflammation of left 05/14/2021 No Yes lower extremity L97.821 Non-pressure chronic ulcer of other part of left lower  leg limited to breakdown 05/14/2021 No Yes of skin Inactive Problems Resolved Problems Electronic Signature(s) Signed: 05/28/2021 5:27:23 PM By: Pam Najjarobson, Phung Kotas MD Entered By: Pam Najjarobson, Laylaa Guevarra on 05/28/2021 15:17:08 -------------------------------------------------------------------------------- Progress Note Details Patient Name: Date of Service: Edman CircleMILLER, Pam [name] 05/28/2021 1:45 PM Medical Record Number: [id number] Patient Account Number: [id number] Date of Birth/Sex: Treating RN: 1940/12/28 (80 y.o. Pam CluckF) [name], Pam Primary Care Provider: Deatra JamesSun, Pam Other Clinician: Referring Provider: Treating Provider/Extender: Pam Palmsobson, Nathanyl Andujo [name], Pam Weeks in Treatment: 2 Subjective History of Present Illness (HPI) ADMISSION 05/14/2021 with This is a 80 year old reasonably healthy woman who  is still working at [employer] and about to retire at the end of the month. She has had a problem with lower extremity edema for about the last 6 months she tells me. 2 months ago she noted small bumps on both of her legs however the left one medially morphed into a large blister. She was seen by her primary physician Dr. [name] on 04/19/2021. Put on doxycycline. He noted at that time that the wound had been weeping for the last 5 weeks. The patient does not have a wound history. 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 We used Iodoflex under compression last week. 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 We have been using Iodoflex to clean up the wound surface. She came in with drainage. Objective Constitutional Vitals Time Taken: 2:32 PM, Height: 64 in,  Weight: 180 lbs, BMI: 30.9, Temperature: 98.2 F, Pulse: 72 bpm, Respiratory Rate: 16 breaths/min, Blood Pressure: 139/81 mmHg. Integumentary (Hair, Skin) Wound #1 status is Open. Original cause of wound was Gradually Appeared. The date acquired was: 03/14/2021. The wound has been in treatment 2 weeks. The wound is located on the Left,Medial Lower Leg. The wound measures 4.3cm length x 5cm width x 0.1cm depth; 16.886cm^2 area and 1.689cm^3 volume. There is Fat Layer (Subcutaneous Tissue) exposed. There is no tunneling or undermining noted. There is a medium amount of purulent drainage noted. Foul odor after cleansing was noted. The wound margin is distinct with the outline attached to the wound base. There is small (1-33%) pink granulation within the wound bed. There is a large (67-100%) amount of necrotic tissue within the wound bed including Adherent Slough. 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 There was a Selective/Open Wound Skin/Dermis Debridement with a total area of 21.5 sq cm performed by Pam Caul., MD. With the following instrument(s): Curette to remove Non-Viable tissue/material. Material removed includes [facility] and Skin: Dermis and after achieving pain control using Other (Benzocaine). No specimens were taken. A time out was conducted at 14:44, prior to the start of the procedure. A Moderate amount of bleeding was controlled with Pressure. The procedure was tolerated well. Post Debridement Measurements: 4.3cm length x 5cm width x 0.1cm depth; 1.689cm^3 volume. Character of Wound/Ulcer Post Debridement is stable. Severity of Tissue  Post Debridement is: Fat layer exposed. Post procedure Diagnosis Wound #1: Same as Pre-Procedure Pre-procedure diagnosis of Wound #1 is a Venous Leg Ulcer located on the Left,Medial Lower Leg . There was a Four Layer Compression Therapy Procedure by Pam Iba, RN. Post procedure Diagnosis Wound #1: Same as Pre-Procedure Plan Follow-up Appointments: Return Appointment in 1 week. - with Dr. Chauncey Mann Shower/ Hygiene: May shower with protection but do not get wound dressing(s) wet. Edema Control - Lymphedema / SCD / Other: Avoid standing for long periods of time. Exercise regularly Other  Edema Control Orders/Instructions: - Whenever sitting elevate legs at least to level of belly button. Additional Orders / Instructions: Follow Nutritious Diet WOUND #1: - Lower Leg Wound Laterality: Left, Medial Cleanser: Soap and Water 1 x Per Week/30 Days Discharge Instructions: May shower and wash wound with dial antibacterial soap and water prior to dressing change. Cleanser: Wound Cleanser 1 x Per Week/30 Days Discharge Instructions: Cleanse the wound with wound cleanser prior to applying a clean dressing using gauze sponges, not tissue or cotton balls. Peri-Wound Care: Triamcinolone 15 (g) 1 x Per Week/30 Days Discharge Instructions: T wound and periwound o Peri-Wound Care: Sween Lotion (Moisturizing lotion) 1 x Per Week/30 Days Discharge Instructions: Apply to periwound Prim Dressing: IODOFLEX 0.9% Cadexomer Iodine Pad 4x6 cm 1 x Per Week/30 Days ary Discharge Instructions: Apply to wound bed as instructed Secondary Dressing: Woven Gauze Sponge, Non-Sterile 4x4 in 1 x Per Week/30 Days Discharge Instructions: Apply over primary dressing as directed. Secondary Dressing: ABD Pad, 8x10 1 x Per Week/30 Days Discharge Instructions: Apply over primary dressing as directed. Com pression Wrap: FourPress (4 layer compression wrap) 1 x Per Week/30 Days Discharge Instructions: Apply four layer  compression as directed. May also use Miliken CoFlex 2 layer compression system as alternative. 1. 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 2. I continued with Iodoflex 3. She will likely need continued debridements of the surface if there is deterioration or stagnation a deep tissue culture is in order. 4. I also wonder about venous reflux studies although she has not had a history of wounds in this area Electronic Signature(s) Signed: 05/28/2021 5:27:23 PM By: Pam Najjar MD Entered By: Pam [name] on 05/28/2021 15:19:50 -------------------------------------------------------------------------------- SuperBill Details Patient Name: Date of Service: CHANIE, SOUCEK 05/28/2021 Medical Record Number: [id number] Patient Account Number: [id number] Date of Birth/Sex: Treating RN: [birth year] (80 y.o. Pam [name] Primary Care Provider: Deatra [name] Other Clinician: Referring Provider: Treating Provider/Extender: Pam [name] Weeks in Treatment: 2 Diagnosis Coding ICD-10 Codes Code Description [phone] Chronic venous hypertension (idiopathic) with ulcer and inflammation of left lower extremity L97.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 of skin Facility Procedures CPT4 Code: [id number] Description: 11042 - DEB SUBQ TISSUE 20 SQ CM/< ICD-10 Diagnosis Description L97.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 Modifier: of skin Quantity: 1 CPT4 Code: [id number] Description: 11045 - DEB SUBQ TISS EA ADDL 20CM ICD-10 Diagnosis Description L97.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 Modifier: of skin Quantity: 1 Physician Procedures : CPT4 Code Description Modifier [id number] 11042 - WC PHYS SUBQ TISS 20 SQ CM ICD-10 Diagnosis Description L97.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 of skin Quantity: 1 : [id number]  11045 - WC PHYS SUBQ TISS EA ADDL 20 CM ICD-10 Diagnosis Description L97.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 of skin Quantity: 1 Electronic Signature(s) Signed: 05/28/2021 5:27:23 PM By: Pam Najjar MD Entered By: Pam [name] on 05/28/2021 15:20:51

## 2021-05-30 ENCOUNTER — Encounter (HOSPITAL_BASED_OUTPATIENT_CLINIC_OR_DEPARTMENT_OTHER): Payer: Self-pay

## 2021-05-30 ENCOUNTER — Other Ambulatory Visit: Payer: Self-pay

## 2021-05-30 ENCOUNTER — Emergency Department (HOSPITAL_BASED_OUTPATIENT_CLINIC_OR_DEPARTMENT_OTHER)
Admission: EM | Admit: 2021-05-30 | Discharge: 2021-05-30 | Disposition: A | Discharge status: Home / Self Care | Payer: BC Managed Care – PPO | Attending: Emergency Medicine | Admitting: Emergency Medicine

## 2021-05-30 DIAGNOSIS — Z041 Encounter for examination and observation following transport accident: Secondary | ICD-10-CM | POA: Insufficient documentation

## 2021-05-30 DIAGNOSIS — I10 Essential (primary) hypertension: Secondary | ICD-10-CM | POA: Insufficient documentation

## 2021-05-30 DIAGNOSIS — Z79899 Other long term (current) drug therapy: Secondary | ICD-10-CM | POA: Insufficient documentation

## 2021-05-30 HISTORY — DX: Pure hypercholesterolemia, unspecified: E78.00

## 2021-05-30 HISTORY — DX: Essential (primary) hypertension: I10

## 2021-05-30 NOTE — ED Notes (Signed)
Pt provided discharge instructions and prescription information. Pt was given the opportunity to ask questions and questions were answered. Discharge signature not obtained in the setting of the COVID-19 pandemic in order to reduce high touch surfaces.  ° °
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
## 2021-05-30 NOTE — Discharge Instructions (Addendum)
Expect to maybe be sore and stiff tomorrow.  This would be normal.  Follow-up with your doctor as needed.
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
## 2021-06-03 NOTE — Progress Notes (Signed)
Pam [name], Pam [name] ([id number]) Visit Report for 05/28/2021 Arrival Information Details Patient Name: Date of Service: Pam [name], Pam [name] 05/28/2021 1:45 PM Medical Record Number: [id number] Patient Account Number: [id number] Date of Birth/Sex: Treating RN: February 04, 1941 (80 y.o. [name] Primary Care Kerriann Kamphuis: [name] Other Clinician: Referring Lilliane Sposito: Treating Consandra Laske/Extender: Angus Palms Weeks in Treatment: 2 Visit Information History Since Last Visit Added or deleted any medications: No Patient Arrived: Ambulatory Any new allergies or adverse reactions: No Arrival Time: 14:29 Had a fall or experienced change in No Accompanied By: alone activities of daily living that may affect Transfer Assistance: None risk of falls: Patient Identification Verified: Yes Signs or symptoms of abuse/neglect since last visito No Secondary Verification Process Completed: Yes Hospitalized since last visit: No Patient Requires Transmission-Based Precautions: No Implantable device outside of the clinic excluding No Patient Has Alerts: No cellular tissue based products placed in the center since last visit: Has Dressing in Place as Prescribed: Yes Has Compression in Place as Prescribed: Yes Pain Present Now: No Electronic Signature(s) Signed: 05/31/2021 5:37:12 PM By: Zandra Abts RN, BSN Entered By: Zandra Abts on 05/28/2021 14:32:29 -------------------------------------------------------------------------------- Compression Therapy Details Patient Name: Date of Service: Pam [name], Pam [name] 05/28/2021 1:45 PM Medical Record Number: [id number] Patient Account Number: [id number] Date of Birth/Sex: Treating RN: May 29, 1941 (80 y.o. [name] Primary Care Roniya Tetro: [name] Other Clinician: Referring Aariah Godette: Treating Tyrah Broers/Extender: Angus Palms Weeks in Treatment: 2 Compression Therapy Performed for Wound Assessment: Wound #1 Left,Medial Lower  Leg Performed By: Clinician [name] Iba, RN Compression Type: Four Layer Post Procedure Diagnosis Same as Pre-procedure Electronic Signature(s) Signed: 05/28/2021 5:38:00 PM By: [name] Iba Entered By: [name] Iba on 05/28/2021 14:53:01 -------------------------------------------------------------------------------- Encounter Discharge Information Details Patient Name: Date of Service: Pam [name], Pam [name] 05/28/2021 1:45 PM Medical Record Number: [id number] Patient Account Number: [id number] Date of Birth/Sex: Treating RN: 1941-01-06 (80 y.o. [name] Primary Care Sargun Rummell: [name] Other Clinician: Referring Latrelle Bazar: Treating Tianah Lonardo/Extender: Angus Palms Weeks in Treatment: 2 Encounter Discharge Information Items Post Procedure Vitals Discharge Condition: Stable Temperature (F): 98.2 Ambulatory Status: Ambulatory Pulse (bpm): 72 Discharge Destination: Home Respiratory Rate (breaths/min): 16 Transportation: Private Auto Blood Pressure (mmHg): 139/81 Accompanied By: self Schedule Follow-up Appointment: Yes Clinical Summary of Care: Electronic Signature(s) Signed: 05/28/2021 4:58:53 PM By: Shawn Stall Entered By: Shawn Stall on 05/28/2021 16:30:16 -------------------------------------------------------------------------------- Lower Extremity Assessment Details Patient Name: Date of Service: Pam [name], Pam [name] 05/28/2021 1:45 PM Medical Record Number: [id number] Patient Account Number: [id number] Date of Birth/Sex: Treating RN: [birth year] (80 y.o. [name] Primary Care Dinari Stgermaine: [name] Other Clinician: Referring Zuzanna Maroney: Treating Khyleigh Furney/Extender: Angus Palms Weeks in Treatment: 2 Edema Assessment Assessed: [Left: No] [Right: No] Edema: [Left: Ye] [Right: s] Calf Left: Right: Point of Measurement: 35 cm From Medial Instep 31.5 cm Ankle Left: Right: Point of Measurement: 9 cm From Medial Instep 23.5  cm Vascular Assessment Pulses: Dorsalis Pedis Palpable: [Left:Yes] Electronic Signature(s) Signed: 05/31/2021 5:37:12 PM By: Zandra Abts RN, BSN Entered By: Zandra Abts on 05/28/2021 14:39:01 -------------------------------------------------------------------------------- Multi Wound Chart Details Patient Name: Date of Service: Pam [name], Pam [name] 05/28/2021 1:45 PM Medical Record Number: [id number] Patient Account Number: [id number] Date of Birth/Sex: Treating RN: 05/04/1941 (80 y.o. [name] Primary Care Tamanika Heiney: [name] Other Clinician: Referring Tirzah Fross: Treating Zaydrian Batta/Extender: Angus Palms Weeks in Treatment: 2 Vital Signs Height(in): 64 Pulse(bpm): 72 Weight(lbs): 180 Blood Pressure(mmHg): 139/81 Body Mass Index(BMI): 31 Temperature(F): 98.2 Respiratory Rate(breaths/min): 16 Photos: [1:No  Photos Left, Medial Lower Leg] [N/A:N/A N/A] Wound Location: [1:Gradually Appeared] [N/A:N/A] Wounding Event: [1:Venous Leg Ulcer] [N/A:N/A] Primary Etiology: [1:Cataracts, Glaucoma, Hypertension,] [N/A:N/A] Comorbid History: [1:Osteoarthritis 03/14/2021] [N/A:N/A] Date Acquired: [1:2] [N/A:N/A] Weeks of Treatment: [1:Open] [N/A:N/A] Wound Status: [1:4.3x5x0.1] [N/A:N/A] Measurements L x W x D (cm) [1:16.886] [N/A:N/A] A (cm) : rea [1:1.689] [N/A:N/A] Volume (cm) : [1:63.20%] [N/A:N/A] % Reduction in Area: [1:63.20%] [N/A:N/A] % Reduction in Volume: [1:Full Thickness With Exposed Support] [N/A:N/A] Classification: [1:Structures Medium] [N/A:N/A] Exudate Amount: [1:Purulent] [N/A:N/A] Exudate Type: [1:yellow, brown, green] [N/A:N/A] Exudate Color: [1:Yes] [N/A:N/A] Foul Odor A Cleansing: [1:fter No] [N/A:N/A] Odor Anticipated Due to Product Use: [1:Distinct, outline attached] [N/A:N/A] Wound Margin: [1:Small (1-33%)] [N/A:N/A] Granulation A mount: [1:Pink] [N/A:N/A] Granulation Quality: [1:Large (67-100%)] [N/A:N/A] Necrotic  Amount: [1:Fat Layer (Subcutaneous Tissue): Yes N/A] Exposed Structures: [1:Fascia: No Tendon: No Muscle: No Joint: No Bone: No Small (1-33%)] [N/A:N/A] Epithelialization: [1:Debridement - Selective/Open Wound N/A] Debridement: Pre-procedure Verification/Time Out 14:44 [N/A:N/A] Taken: [1:Other] [N/A:N/A] Pain Control: [1:Slough] [N/A:N/A] Tissue Debrided: [1:Skin/Dermis] [N/A:N/A] Level: [1:21.5] [N/A:N/A] Debridement A (sq cm): [1:rea Curette] [N/A:N/A] Instrument: [1:Moderate] [N/A:N/A] Bleeding: [1:Pressure] [N/A:N/A] Hemostasis A chieved: [1:Procedure was tolerated well] [N/A:N/A] Debridement Treatment Response: [1:4.3x5x0.1] [N/A:N/A] Post Debridement Measurements L x W x D (cm) [1:1.689] [N/A:N/A] Post Debridement Volume: (cm) [1:Compression Therapy] [N/A:N/A] Procedures Performed: [1:Debridement] Treatment Notes Electronic Signature(s) Signed: 05/28/2021 5:27:23 PM By: Baltazar Najjar MD Signed: 05/28/2021 5:38:00 PM By: [name] Iba Signed: 05/28/2021 5:38:00 PM By: [name] Iba Entered By: Baltazar Najjar on 05/28/2021 15:17:15 -------------------------------------------------------------------------------- Multi-Disciplinary Care Plan Details Patient Name: Date of Service: Pam [name], Pam [name] 05/28/2021 1:45 PM Medical Record Number: [id number] Patient Account Number: [id number] Date of Birth/Sex: Treating RN: 11-Oct-1941 (80 y.o. [name] Primary Care Leatrice Parilla: [name] Other Clinician: Referring Mercedes Valeriano: Treating Layney Gillson/Extender: Angus Palms Weeks in Treatment: 2 Active Inactive Venous Leg Ulcer Nursing Diagnoses: Actual venous Insuffiency (use after diagnosis is confirmed) Goals: Patient will maintain optimal edema control Date Initiated: 05/14/2021 Target Resolution Date: 06/11/2021 Goal Status: Active Patient/caregiver will verbalize understanding of disease process and disease management Date Initiated: 05/14/2021 Target  Resolution Date: 06/11/2021 Goal Status: Active Interventions: Assess peripheral edema status every visit. Compression as ordered Treatment Activities: Therapeutic compression applied : 05/14/2021 Notes: Wound/Skin Impairment Nursing Diagnoses: Impaired tissue integrity Goals: Patient/caregiver will verbalize understanding of skin care regimen Date Initiated: 05/14/2021 Target Resolution Date: 06/11/2021 Goal Status: Active Ulcer/skin breakdown will have a volume reduction of 30% by week 4 Date Initiated: 05/14/2021 Target Resolution Date: 06/11/2021 Goal Status: Active Interventions: Assess patient/caregiver ability to obtain necessary supplies Assess patient/caregiver ability to perform ulcer/skin care regimen upon admission and as needed Assess ulceration(s) every visit Provide education on ulcer and skin care Treatment Activities: Skin care regimen initiated : 05/14/2021 Topical wound management initiated : 05/14/2021 Notes: Electronic Signature(s) Signed: 05/28/2021 5:38:00 PM By: [name] Iba Entered By: [name] Iba on 05/28/2021 14:53:32 -------------------------------------------------------------------------------- Pain Assessment Details Patient Name: Date of Service: Pam [name], Pam [name] 05/28/2021 1:45 PM Medical Record Number: [id number] Patient Account Number: [id number] Date of Birth/Sex: Treating RN: February 27, 1941 (80 y.o. [name] Primary Care Keara Pagliarulo: [name] Other Clinician: Referring Andrei Mccook: Treating Yeslin Delio/Extender: Angus Palms Weeks in Treatment: 2 Active Problems Location of Pain Severity and Description of Pain Patient Has Paino No Site Locations Pain Management and Medication Current Pain Management: Electronic Signature(s) Signed: 05/31/2021 5:37:12 PM By: Zandra Abts RN, BSN Entered By: Zandra Abts on 05/28/2021 14:32:53 -------------------------------------------------------------------------------- Patient/Caregiver  Education Details Patient Name: Date of Service: Pam [name] 6/17/2022andnbsp1:45 PM Medical Record Number: [id number] Patient Account Number: [id number] Date of Birth/Gender: Treating RN: Nov 26, 1941 (80 y.o. [name] Primary Care Physician: [name] Other Clinician: Referring Physician: Treating Physician/Extender: Burgess Estelle in Treatment: 2 Education Assessment Education Provided To: Patient Education Topics Provided Venous: Methods: Explain/Verbal, Printed Responses: State content correctly Wound Debridement: Methods: Explain/Verbal Responses: State content correctly Wound/Skin Impairment: Methods: Explain/Verbal, Printed Responses: State content correctly Electronic Signature(s) Signed: 05/28/2021 5:38:00 PM By: [name] Iba Entered By: [name] Iba on 05/28/2021 14:53:57 -------------------------------------------------------------------------------- Wound Assessment Details Patient Name: Date of Service: Pam [name], Pam [name] 05/28/2021 1:45 PM Medical Record Number: [id number] Patient Account Number: [id number] Date of Birth/Sex: Treating RN: 05-Sep-1941 (80 y.o. [name] Primary Care Corine Solorio: [name] Other Clinician: Referring Yaffa Seckman: Treating Luiscarlos Kaczmarczyk/Extender: Angus Palms Weeks in Treatment: 2 Wound Status Wound Number: 1 Primary Etiology: Venous Leg Ulcer Wound Location: Left, Medial Lower Leg Wound Status: Open Wounding Event: Gradually Appeared Comorbid History: Cataracts, Glaucoma, Hypertension, Osteoarthritis Date Acquired: 03/14/2021 Weeks Of Treatment: 2 Clustered Wound: No Photos Wound Measurements Length: (cm) 4.3 Width: (cm) 5 Depth: (cm) 0.1 Area: (cm) 16.886 Volume: (cm) 1.689 % Reduction in Area: 63.2% % Reduction in Volume: 63.2% Epithelialization: Small (1-33%) Tunneling: No Undermining: No Wound Description Classification: Full Thickness With Exposed Support  Structures Wound Margin: Distinct, outline attached Exudate Amount: Medium Exudate Type: Purulent Exudate Color: yellow, brown, green Foul Odor After Cleansing: Yes Due to Product Use: No Slough/Fibrino Yes Wound Bed Granulation Amount: Small (1-33%) Exposed Structure Granulation Quality: Pink Fascia Exposed: No Necrotic Amount: Large (67-100%) Fat Layer (Subcutaneous Tissue) Exposed: Yes Necrotic Quality: Adherent Slough Tendon Exposed: No Muscle Exposed: No Joint Exposed: No Bone Exposed: No Treatment Notes Wound #1 (Lower Leg) Wound Laterality: Left, Medial Cleanser Soap and Water Discharge Instruction: May shower and wash wound with dial antibacterial soap and water prior to dressing change. Wound Cleanser Discharge Instruction: Cleanse the wound with wound cleanser prior to applying a clean dressing using gauze sponges, not tissue or cotton balls. Peri-Wound Care Triamcinolone 15 (g) Discharge Instruction: T wound and periwound o Sween Lotion (Moisturizing lotion) Discharge Instruction: Apply to periwound Topical Primary Dressing IODOFLEX 0.9% Cadexomer Iodine Pad 4x6 cm Discharge Instruction: Apply to wound bed as instructed Secondary Dressing Woven Gauze Sponge, Non-Sterile 4x4 in Discharge Instruction: Apply over primary dressing as directed. ABD Pad, 8x10 Discharge Instruction: Apply over primary dressing as directed. Secured With Compression Wrap FourPress (4 layer compression wrap) Discharge Instruction: Apply four layer compression as directed. May also use Miliken CoFlex 2 layer compression system as alternative. Compression Stockings Add-Ons Electronic Signature(s) Signed: 05/31/2021 5:37:12 PM By: Zandra Abts RN, BSN Signed: 06/03/2021 8:38:46 AM By: Rolan Lipa Entered By: Rolan Lipa on 05/31/2021 14:58:34 -------------------------------------------------------------------------------- Vitals Details Patient Name: Date of  Service: Pam [name], Pam [name] 05/28/2021 1:45 PM Medical Record Number: [id number] Patient Account Number: [id number] Date of Birth/Sex: Treating RN: Nov 03, 1941 (80 y.o. [name] Primary Care Baptiste Littler: [name] Other Clinician: Referring Shanera Meske: Treating Ramiel Forti/Extender: Angus Palms Weeks in Treatment: 2 Vital Signs Time Taken: 14:32 Temperature (F): 98.2 Height (in): 64 Pulse (bpm): 72 Weight (lbs): 180 Respiratory Rate (breaths/min): 16 Body Mass Index (BMI): 30.9 Blood Pressure (mmHg): 139/81 Reference Range: 80 - 120 mg / dl Electronic Signature(s) Signed: 05/31/2021 5:37:12 PM By: Zandra Abts RN, BSN Entered By: Zandra Abts on 05/28/2021 14:32:48

## 2021-06-04 ENCOUNTER — Encounter (HOSPITAL_BASED_OUTPATIENT_CLINIC_OR_DEPARTMENT_OTHER): Payer: BC Managed Care – PPO | Admitting: Internal Medicine

## 2021-06-04 ENCOUNTER — Other Ambulatory Visit: Payer: Self-pay

## 2021-06-04 DIAGNOSIS — I87332 Chronic venous hypertension (idiopathic) with ulcer and inflammation of left lower extremity: Secondary | ICD-10-CM | POA: Diagnosis not present

## 2021-06-04 NOTE — Progress Notes (Signed)
Pam [name], Pam [name] ([id number]) Visit Report for 06/04/2021 Arrival Information Details Patient Name: Date of Service: CORENE, RESNICK 06/04/2021 1:30 PM Medical Record Number: [id number] Patient Account Number: [id number] Date of Birth/Sex: Treating RN: 08-04-1941 (80 y.o. Billy Coast, Bonita Quin Primary Care Vieno Tarrant: [name] Other Clinician: Referring Jazzmine Kleiman: Treating Jarom Govan/Extender: Angus Palms Weeks in Treatment: 3 Visit Information History Since Last Visit Added or deleted any medications: No Patient Arrived: Ambulatory Any new allergies or adverse reactions: No Arrival Time: 14:13 Had a fall or experienced change in No Accompanied By: self activities of daily living that may affect Transfer Assistance: None risk of falls: Patient Identification Verified: Yes Signs or symptoms of abuse/neglect since last visito No Secondary Verification Process Completed: Yes Hospitalized since last visit: No Patient Requires Transmission-Based Precautions: No Implantable device outside of the clinic excluding No Patient Has Alerts: No cellular tissue based products placed in the center since last visit: Has Dressing in Place as Prescribed: Yes Pain Present Now: No Electronic Signature(s) Signed: 06/04/2021 5:07:58 PM By: Karl Ito Entered By: Karl Ito on 06/04/2021 14:13:19 -------------------------------------------------------------------------------- Compression Therapy Details Patient Name: Date of Service: Pam [name], Pam [name] 06/04/2021 1:30 PM Medical Record Number: [id number] Patient Account Number: [id number] Date of Birth/Sex: Treating RN: Dec 01, 1941 (80 y.o. Tommye Standard Primary Care Clyde Upshaw: [name] Other Clinician: Referring Amarie Viles: Treating Jentri Aye/Extender: Angus Palms Weeks in Treatment: 3 Compression Therapy Performed for Wound Assessment: Wound #1 Left,Medial Lower Leg Performed By: Clinician Shawn Stall,  RN Compression Type: Four Layer Post Procedure Diagnosis Same as Pre-procedure Electronic Signature(s) Signed: 06/04/2021 6:17:32 PM By: Zenaida Deed RN, BSN Entered By: Zenaida Deed on 06/04/2021 15:02:47 -------------------------------------------------------------------------------- Encounter Discharge Information Details Patient Name: Date of Service: Pam [name], Pam [name] NELL 06/04/2021 1:30 PM Medical Record Number: [id number] Patient Account Number: [id number] Date of Birth/Sex: Treating RN: 1941-06-09 (80 y.o. [name] Primary Care Gusta Marksberry: [name] Other Clinician: Referring Eliel Dudding: Treating Trea Carnegie/Extender: Angus Palms Weeks in Treatment: 3 Encounter Discharge Information Items Post Procedure Vitals Discharge Condition: Stable Temperature (F): 98.2 Ambulatory Status: Ambulatory Pulse (bpm): 74 Discharge Destination: Home Respiratory Rate (breaths/min): 16 Transportation: Private Auto Blood Pressure (mmHg): 132/80 Accompanied By: self Schedule Follow-up Appointment: Yes Clinical Summary of Care: Electronic Signature(s) Signed: 06/04/2021 6:02:43 PM By: Shawn Stall Entered By: Shawn Stall on 06/04/2021 17:56:43 -------------------------------------------------------------------------------- Lower Extremity Assessment Details Patient Name: Date of Service: Pam [name], Pam [name] 06/04/2021 1:30 PM Medical Record Number: [id number] Patient Account Number: [id number] Date of Birth/Sex: Treating RN: [birth year] (80 y.o. [name] Primary Care Karsynn Deweese: [name] Other Clinician: Referring Leah Thornberry: Treating Coen Miyasato/Extender: Angus Palms Weeks in Treatment: 3 Edema Assessment Assessed: [Left: Yes] [Right: No] Edema: [Left: Ye] [Right: s] Calf Left: Right: Point of Measurement: 35 cm From Medial Instep 34 cm Ankle Left: Right: Point of Measurement: 9 cm From Medial Instep 26.5 cm Vascular  Assessment Pulses: Dorsalis Pedis Palpable: [Left:Yes] Electronic Signature(s) Signed: 06/04/2021 6:02:43 PM By: Shawn Stall Entered By: Shawn Stall on 06/04/2021 14:33:02 -------------------------------------------------------------------------------- Multi Wound Chart Details Patient Name: Date of Service: Pam [name], Pam [name] NELL 06/04/2021 1:30 PM Medical Record Number: [id number] Patient Account Number: [id number] Date of Birth/Sex: Treating RN: 01-27-1941 (81 y.o. Tommye Standard Primary Care Shraga Custard: [name] Other Clinician: Referring Tanaisha Pittman: Treating Nolene Rocks/Extender: Angus Palms Weeks in Treatment: 3 Vital Signs Height(in): 64 Pulse(bpm): 74 Weight(lbs): 180 Blood Pressure(mmHg): 132/80 Body Mass Index(BMI): 31 Temperature(F): 98.2 Respiratory Rate(breaths/min): 16 Photos: [1:No Photos Left, Medial Lower Leg] [N/A:N/A N/A] Wound Location: [  1:Gradually Appeared] [N/A:N/A] Wounding Event: [1:Venous Leg Ulcer] [N/A:N/A] Primary Etiology: [1:Cataracts, Glaucoma, Hypertension,] [N/A:N/A] Comorbid History: [1:Osteoarthritis 03/14/2021] [N/A:N/A] Date Acquired: [1:3] [N/A:N/A] Weeks of Treatment: [1:Open] [N/A:N/A] Wound Status: [1:4x5.2x0.1] [N/A:N/A] Measurements L x W x D (cm) [1:16.336] [N/A:N/A] A (cm) : rea [1:1.634] [N/A:N/A] Volume (cm) : [1:64.40%] [N/A:N/A] % Reduction in Area: [1:64.40%] [N/A:N/A] % Reduction in Volume: [1:Full Thickness With Exposed Support] [N/A:N/A] Classification: [1:Structures Medium] [N/A:N/A] Exudate Amount: [1:Purulent] [N/A:N/A] Exudate Type: [1:yellow, brown, green] [N/A:N/A] Exudate Color: [1:Yes] [N/A:N/A] Foul Odor A Cleansing: [1:fter No] [N/A:N/A] Odor Anticipated Due to Product Use: [1:Distinct, outline attached] [N/A:N/A] Wound Margin: [1:Medium (34-66%)] [N/A:N/A] Granulation A mount: [1:Pink] [N/A:N/A] Granulation Quality: [1:Medium (34-66%)] [N/A:N/A] Necrotic Amount: [1:Fat Layer  (Subcutaneous Tissue): Yes N/A] Exposed Structures: [1:Fascia: No Tendon: No Muscle: No Joint: No Bone: No Small (1-33%)] [N/A:N/A] Epithelialization: [1:Debridement - Excisional] [N/A:N/A] Debridement: Pre-procedure Verification/Time Out 15:00 [N/A:N/A] Taken: [1:Other] [N/A:N/A] Pain Control: [1:Subcutaneous, Slough] [N/A:N/A] Tissue Debrided: [1:Skin/Subcutaneous Tissue] [N/A:N/A] Level: [1:20.8] [N/A:N/A] Debridement A (sq cm): [1:rea Curette] [N/A:N/A] Instrument: [1:Minimum] [N/A:N/A] Bleeding: [1:Pressure] [N/A:N/A] Hemostasis A chieved: [1:8] [N/A:N/A] Procedural Pain: [1:5] [N/A:N/A] Post Procedural Pain: [1:Procedure was tolerated well] [N/A:N/A] Debridement Treatment Response: [1:4x5.2x0.1] [N/A:N/A] Post Debridement Measurements L x W x D (cm) [1:1.634] [N/A:N/A] Post Debridement Volume: (cm) [1:Compression Therapy] [N/A:N/A] Procedures Performed: [1:Debridement] Treatment Notes Electronic Signature(s) Signed: 06/04/2021 5:20:40 PM By: Baltazar Najjar MD Signed: 06/04/2021 6:17:32 PM By: Zenaida Deed RN, BSN Entered By: Baltazar Najjar on 06/04/2021 15:26:33 -------------------------------------------------------------------------------- Multi-Disciplinary Care Plan Details Patient Name: Date of Service: Pam [name], Pam [name] NELL 06/04/2021 1:30 PM Medical Record Number: [id number] Patient Account Number: [id number] Date of Birth/Sex: Treating RN: 09/22/1941 (80 y.o. Tommye Standard Primary Care Alessandro Griep: [name] Other Clinician: Referring Moe Brier: Treating Hameed Kolar/Extender: Angus Palms Weeks in Treatment: 3 Active Inactive Venous Leg Ulcer Nursing Diagnoses: Actual venous Insuffiency (use after diagnosis is confirmed) Goals: Patient will maintain optimal edema control Date Initiated: 05/14/2021 Target Resolution Date: 06/11/2021 Goal Status: Active Patient/caregiver will verbalize understanding of disease process and disease management Date  Initiated: 05/14/2021 Target Resolution Date: 06/11/2021 Goal Status: Active Interventions: Assess peripheral edema status every visit. Compression as ordered Treatment Activities: Therapeutic compression applied : 05/14/2021 Notes: Wound/Skin Impairment Nursing Diagnoses: Impaired tissue integrity Goals: Patient/caregiver will verbalize understanding of skin care regimen Date Initiated: 05/14/2021 Target Resolution Date: 06/11/2021 Goal Status: Active Ulcer/skin breakdown will have a volume reduction of 30% by week 4 Date Initiated: 05/14/2021 Target Resolution Date: 06/11/2021 Goal Status: Active Interventions: Assess patient/caregiver ability to obtain necessary supplies Assess patient/caregiver ability to perform ulcer/skin care regimen upon admission and as needed Assess ulceration(s) every visit Provide education on ulcer and skin care Treatment Activities: Skin care regimen initiated : 05/14/2021 Topical wound management initiated : 05/14/2021 Notes: Electronic Signature(s) Signed: 06/04/2021 6:17:32 PM By: Zenaida Deed RN, BSN Entered By: Zenaida Deed on 06/04/2021 15:12:00 -------------------------------------------------------------------------------- Pain Assessment Details Patient Name: Date of Service: Pam [name], Pam [name] NELL 06/04/2021 1:30 PM Medical Record Number: [id number] Patient Account Number: [id number] Date of Birth/Sex: Treating RN: September 24, 1941 (80 y.o. Tommye Standard Primary Care Nusaiba Guallpa: [name] Other Clinician: Referring Keelynn Furgerson: Treating Addalynn Kumari/Extender: Angus Palms Weeks in Treatment: 3 Active Problems Location of Pain Severity and Description of Pain Patient Has Paino No Site Locations Pain Management and Medication Current Pain Management: Electronic Signature(s) Signed: 06/04/2021 5:07:58 PM By: Karl Ito Signed: 06/04/2021 6:17:32 PM By: Zenaida Deed RN, BSN Entered By: Karl Ito on 06/04/2021  14:14:51 -------------------------------------------------------------------------------- Patient/Caregiver Education Details Patient Name:  Date of Service: Pam [name], Pam [name] 6/24/2022andnbsp1:30 PM Medical Record Number: [id number] Patient Account Number: [id number] Date of Birth/Gender: Treating RN: 1941/05/31 (80 y.o. Tommye Standard Primary Care Physician: [name] Other Clinician: Referring Physician: Treating Physician/Extender: Burgess Estelle in Treatment: 3 Education Assessment Education Provided To: Patient Education Topics Provided Venous: Methods: Explain/Verbal Responses: Reinforcements needed, State content correctly Wound/Skin Impairment: Methods: Explain/Verbal Responses: Reinforcements needed, State content correctly Electronic Signature(s) Signed: 06/04/2021 6:17:32 PM By: Zenaida Deed RN, BSN Entered By: Zenaida Deed on 06/04/2021 15:12:20 -------------------------------------------------------------------------------- Wound Assessment Details Patient Name: Date of Service: Pam [name], Pam [name] NELL 06/04/2021 1:30 PM Medical Record Number: [id number] Patient Account Number: [id number] Date of Birth/Sex: Treating RN: 11-Jun-1941 (80 y.o. Tommye Standard Primary Care [place] Rico: [name] Other Clinician: Referring Rilyn Upshaw: Treating Kebron Pulse/Extender: Angus Palms Weeks in Treatment: 3 Wound Status Wound Number: 1 Primary Etiology: Venous Leg Ulcer Wound Location: Left, Medial Lower Leg Wound Status: Open Wounding Event: Gradually Appeared Comorbid History: Cataracts, Glaucoma, Hypertension, Osteoarthritis Date Acquired: 03/14/2021 Weeks Of Treatment: 3 Clustered Wound: No Photos Wound Measurements Length: (cm) 4 Width: (cm) 5.2 Depth: (cm) 0.1 Area: (cm) 16.336 Volume: (cm) 1.634 % Reduction in Area: 64.4% % Reduction in Volume: 64.4% Epithelialization: Small (1-33%) Tunneling: No Undermining: No Wound  Description Classification: Full Thickness With Exposed Support Structures Wound Margin: Distinct, outline attached Exudate Amount: Medium Exudate Type: Purulent Exudate Color: yellow, brown, green Foul Odor After Cleansing: Yes Due to Product Use: No Slough/Fibrino Yes Wound Bed Granulation Amount: Medium (34-66%) Exposed Structure Granulation Quality: Pink Fascia Exposed: No Necrotic Amount: Medium (34-66%) Fat Layer (Subcutaneous Tissue) Exposed: Yes Necrotic Quality: Adherent Slough Tendon Exposed: No Muscle Exposed: No Joint Exposed: No Bone Exposed: No Treatment Notes Wound #1 (Lower Leg) Wound Laterality: Left, Medial Cleanser Soap and Water Discharge Instruction: May shower and wash wound with dial antibacterial soap and water prior to dressing change. Wound Cleanser Discharge Instruction: Cleanse the wound with wound cleanser prior to applying a clean dressing using gauze sponges, not tissue or cotton balls. Peri-Wound Care Triamcinolone 15 (g) Discharge Instruction: to periwouind mixed with lotion Sween Lotion (Moisturizing lotion) Discharge Instruction: Apply to periwound Topical Primary Dressing IODOFLEX 0.9% Cadexomer Iodine Pad 4x6 cm Discharge Instruction: Apply to wound bed as instructed Secondary Dressing Woven Gauze Sponge, Non-Sterile 4x4 in Discharge Instruction: Apply over primary dressing as directed. ABD Pad, 8x10 Discharge Instruction: Apply over primary dressing as directed. Secured With Compression Wrap FourPress (4 layer compression wrap) Discharge Instruction: Apply four layer compression as directed. May also use Miliken CoFlex 2 layer compression system as alternative. Compression Stockings Add-Ons Electronic Signature(s) Signed: 06/04/2021 5:07:58 PM By: Karl Ito Signed: 06/04/2021 6:17:32 PM By: Zenaida Deed RN, BSN Entered By: Karl Ito on 06/04/2021  17:05:18 -------------------------------------------------------------------------------- Vitals Details Patient Name: Date of Service: Pam [name], Pam [name] NELL 06/04/2021 1:30 PM Medical Record Number: [id number] Patient Account Number: [id number] Date of Birth/Sex: Treating RN: 1941-10-20 (80 y.o. Tommye Standard Primary Care Hashem Goynes: [name] Other Clinician: Referring Pavan Bring: Treating Michella Detjen/Extender: Angus Palms Weeks in Treatment: 3 Vital Signs Time Taken: 14:14 Temperature (F): 98.2 Height (in): 64 Pulse (bpm): 74 Weight (lbs): 180 Respiratory Rate (breaths/min): 16 Body Mass Index (BMI): 30.9 Blood Pressure (mmHg): 132/80 Reference Range: 80 - 120 mg / dl Electronic Signature(s) Signed: 06/04/2021 5:07:58 PM By: Karl Ito Entered By: Karl Ito on 06/04/2021 14:14:46

## 2021-06-04 NOTE — Progress Notes (Signed)
TREVIA, NOP ([id number]) Visit Report for 06/04/2021 Debridement Details Patient Name: Date of Service: Pam [name], Pam [name] 06/04/2021 1:30 PM Medical Record Number: [id number] Patient Account Number: [id number] Date of Birth/Sex: Treating RN: Dec 14, 1940 (80 y.o. Tommye Standard Primary Care Provider: [name] Other Clinician: Referring Provider: Treating Provider/Extender: Angus Palms Weeks in Treatment: 3 Debridement Performed for Assessment: Wound #1 Left,Medial Lower Leg Performed By: Physician Maxwell Caul., MD Debridement Type: Debridement Severity of Tissue Pre Debridement: Fat layer exposed Level of Consciousness (Pre-procedure): Awake and Alert Pre-procedure Verification/Time Out Yes - 15:00 Taken: Start Time: 15:01 Pain Control: Other : benzocaine 20% spray T Area Debrided (L x W): otal 4 (cm) x 5.2 (cm) = 20.8 (cm) Tissue and other material debrided: Viable, Non-Viable, Slough, Subcutaneous, Slough Level: Skin/Subcutaneous Tissue Debridement Description: Excisional Instrument: Curette Bleeding: Minimum Hemostasis Achieved: Pressure End Time: 15:04 Procedural Pain: 8 Post Procedural Pain: 5 Response to Treatment: Procedure was tolerated well Level of Consciousness (Post- Awake and Alert procedure): Post Debridement Measurements of Total Wound Length: (cm) 4 Width: (cm) 5.2 Depth: (cm) 0.1 Volume: (cm) 1.634 Character of Wound/Ulcer Post Debridement: Improved Severity of Tissue Post Debridement: Fat layer exposed Post Procedure Diagnosis Same as Pre-procedure Electronic Signature(s) Signed: 06/04/2021 5:20:40 PM By: Baltazar Najjar MD Signed: 06/04/2021 6:17:32 PM By: Zenaida Deed RN, BSN Entered By: Baltazar Najjar on 06/04/2021 15:26:44 -------------------------------------------------------------------------------- HPI Details Patient Name: Date of Service: Pam [name], Pam [name] 06/04/2021 1:30 PM Medical Record Number:  [id number] Patient Account Number: [id number] Date of Birth/Sex: Treating RN: 04/28/1941 (80 y.o. Tommye Standard Primary Care Provider: [name] Other Clinician: Referring Provider: Treating Provider/Extender: Angus Palms Weeks in Treatment: 3 History of Present Illness HPI Description: ADMISSION 05/14/2021 with This is a 80 year old reasonably healthy woman who is still working at [employer] and about to retire at the end of the month. She has had a problem with lower extremity edema for about the last 6 months she tells me. 2 months ago she noted small bumps on both of her legs however the left one medially morphed into a large blister. She was seen by her primary physician Dr. [name] on 04/19/2021. Put on doxycycline. He noted at that time that the wound had been weeping for the last 5 weeks. The patient does not have a wound history. 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 We used Iodoflex under compression last week. 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 We have been using Iodoflex to clean up the wound surface. She came in with drainage. 6/24; wound on the left medial lower leg. Using Iodoflex to clean up the wound surface. We are making gradual progress with that. Nursing reported an odor and drainage. 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 Mentioning this because I would like to see about Apligraf Electronic Signature(s) Signed: 06/04/2021 5:20:40 PM By: Baltazar Najjar MD Entered By: Baltazar Najjar on 06/04/2021  15:27:34 -------------------------------------------------------------------------------- Physical Exam Details Patient Name: Date of Service: Pam [name], Pam [name] 06/04/2021 1:30 PM Medical Record Number: [id number] Patient Account  Number: [id number] Date of Birth/Sex: Treating RN: 06/05/1941 (80 y.o. Tommye Standard Primary Care Provider: [name] Other Clinician: Referring Provider: Treating Provider/Extender: Angus Palms Weeks in Treatment: 3 Constitutional Sitting or standing Blood Pressure is within target range for patient.. Pulse regular and within target range for patient.Marland Kitchen Respirations regular, non-labored and within target range.. Temperature is normal and within the target range for the patient.Marland Kitchen Appears in no distress. Cardiovascular Pedal pulses are palpable. Edema control is good. Notes Wound exam; left medial lower leg. Better looking surface but still with a large amount of nonviable tissue. I used an open curette aggressive debridement. Relatively large amount of bleeding controlled with direct pressure. There is still no evidence of surrounding infection. Electronic Signature(s) Signed: 06/04/2021 5:20:40 PM By: Baltazar Najjar MD Entered By: Baltazar Najjar on 06/04/2021 15:28:57 -------------------------------------------------------------------------------- Physician Orders Details Patient Name: Date of Service: Pam [name], Pam [name] 06/04/2021 1:30 PM Medical Record Number: [id number] Patient Account Number: [id number] Date of Birth/Sex: Treating RN: 1941/06/22 (80 y.o. Tommye Standard Primary Care Provider: [name] Other Clinician: Referring Provider: Treating Provider/Extender: Angus Palms Weeks in Treatment: 3 Verbal / Phone Orders: No Diagnosis Coding ICD-10 Coding Code Description [phone] Chronic venous hypertension (idiopathic) with ulcer and inflammation of left lower extremity L97.821 Non-pressure chronic ulcer of  other part of left lower leg limited to breakdown of skin Follow-up Appointments ppointment in 1 week. - with Dr. Leanord Hawking Return A Cellular or Tissue Based Products Cellular or Tissue Based Product Type: - run IVR for apligraf Bathing/ Shower/ Hygiene May shower with protection but do not get wound dressing(s) wet. Edema Control - Lymphedema / SCD / Other Avoid standing for long periods of time. Exercise regularly Other Edema Control Orders/Instructions: - Whenever sitting elevate legs at least to level of belly button. Additional Orders / Instructions Follow Nutritious Diet Wound Treatment Wound #1 - Lower Leg Wound Laterality: Left, Medial Cleanser: Soap and Water 1 x Per Week/30 Days Discharge Instructions: May shower and wash wound with dial antibacterial soap and water prior to dressing change. Cleanser: Wound Cleanser 1 x Per Week/30 Days Discharge Instructions: Cleanse the wound with wound cleanser prior to applying a clean dressing using gauze sponges, not tissue or cotton balls. Peri-Wound Care: Triamcinolone 15 (g) 1 x Per Week/30 Days Discharge Instructions: to periwouind mixed with lotion Peri-Wound Care: Sween Lotion (Moisturizing lotion) 1 x Per Week/30 Days Discharge Instructions: Apply to periwound Prim Dressing: IODOFLEX 0.9% Cadexomer Iodine Pad 4x6 cm 1 x Per Week/30 Days ary Discharge Instructions: Apply to wound bed as instructed Secondary Dressing: Woven Gauze Sponge, Non-Sterile 4x4 in 1 x Per Week/30 Days Discharge Instructions: Apply over primary dressing as directed. Secondary Dressing: ABD Pad, 8x10 1 x Per Week/30 Days Discharge Instructions: Apply over primary dressing as directed. Compression Wrap: FourPress (4 layer compression wrap) 1 x Per Week/30 Days Discharge Instructions: Apply four layer compression as directed. May also use Miliken CoFlex 2 layer compression system as alternative. Electronic Signature(s) Signed: 06/04/2021 5:20:40 PM By:  Baltazar Najjar MD Signed: 06/04/2021 6:17:32 PM By: Zenaida Deed RN, BSN Entered By: Zenaida Deed on 06/04/2021 15:08:22 -------------------------------------------------------------------------------- Problem List Details Patient Name: Date of Service: Pam [name], Pam [name] 06/04/2021 1:30 PM Medical Record Number: [id number] Patient Account Number: [id number] Date of Birth/Sex: Treating RN: June 05, 1941 (80 y.o. Tommye Standard Primary Care Provider: [name] Other Clinician: Referring Provider: Treating Provider/Extender: Angus Palms Weeks in Treatment: 3 Active Problems ICD-10 Encounter Code Description Active Date  MDM Diagnosis I87.332 Chronic venous hypertension (idiopathic) with ulcer and inflammation of left 05/14/2021 No Yes lower extremity L97.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 05/14/2021 No Yes of skin Inactive Problems Resolved Problems Electronic Signature(s) Signed: 06/04/2021 5:20:40 PM By: Baltazar Najjar MD Entered By: Baltazar Najjar on 06/04/2021 15:26:26 -------------------------------------------------------------------------------- Progress Note Details Patient Name: Date of Service: Pam [name], Pam [name] 06/04/2021 1:30 PM Medical Record Number: [id number] Patient Account Number: [id number] Date of Birth/Sex: Treating RN: [birth year] (80 y.o. Tommye Standard Primary Care Provider: [name] Other Clinician: Referring Provider: Treating Provider/Extender: Angus Palms Weeks in Treatment: 3 Subjective History of Present Illness (HPI) ADMISSION 05/14/2021 with This is a 80 year old reasonably healthy woman who is still working at [employer] and about to retire at the end of the month. She has had a problem with lower extremity edema for about the last 6 months she tells me. 2 months ago she noted small bumps on both of her legs however the left one medially morphed into a large  blister. She was seen by her primary physician Dr. [name] on 04/19/2021. Put on doxycycline. He noted at that time that the wound had been weeping for the last 5 weeks. The patient does not have a wound history. 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 We used Iodoflex under compression last week. 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 We have been using Iodoflex to clean up the wound surface. She came in with drainage. 6/24; wound on the left medial lower leg. Using Iodoflex to clean up the wound surface. We are making gradual progress with that. Nursing reported an odor and drainage. 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 Mentioning this because I would like to see about Apligraf Objective Constitutional Sitting or standing Blood Pressure is within target range for patient.. Pulse regular and within target range for patient.Marland Kitchen Respirations regular, non-labored and within target range.. Temperature is normal and within the target range for the patient.Marland Kitchen Appears in no distress. Vitals Time Taken: 2:14 PM, Height: 64 in, Weight: 180 lbs, BMI: 30.9, Temperature: 98.2 F, Pulse: 74 bpm, Respiratory Rate: 16 breaths/min, Blood Pressure: 132/80 mmHg. Cardiovascular Pedal pulses are palpable. Edema control is good. General Notes: Wound exam; left medial lower leg. Better looking surface but still with a large amount of nonviable tissue. I used an open curette aggressive debridement. Relatively large amount of bleeding controlled with direct pressure. There is  still no evidence of surrounding infection. Integumentary (Hair, Skin) Wound #1 status is Open. Original cause of wound was Gradually Appeared. The date acquired was: 03/14/2021. The wound has been in treatment 3 weeks. The wound is located on the Left,Medial Lower Leg. The wound measures 4cm length x 5.2cm width x 0.1cm depth; 16.336cm^2 area and 1.634cm^3 volume. There is Fat Layer (Subcutaneous Tissue) exposed. There is no tunneling or undermining noted. There is a medium amount of purulent drainage noted. Foul odor after cleansing was noted. The wound margin is distinct with the outline attached to  the wound base. There is medium (34-66%) pink granulation within the wound bed. There is a medium (34-66%) amount of necrotic tissue within the wound bed including Adherent Slough. 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 There was a Excisional Skin/Subcutaneous Tissue Debridement with a total area of 20.8 sq cm performed by Maxwell Caulobson, Torrence Branagan G., MD. With the following instrument(s): Curette to remove Viable and Non-Viable tissue/material. Material removed includes Subcutaneous Tissue and Slough and after achieving pain control using Other (benzocaine 20% spray). No specimens were taken. A time out was conducted at 15:00, prior to the start of the procedure. A Minimum amount of bleeding was controlled with Pressure. The procedure was tolerated well with a pain level of 8 throughout and a pain level of 5 following the procedure. Post Debridement Measurements: 4cm length x 5.2cm width x 0.1cm depth; 1.634cm^3 volume. Character of Wound/Ulcer Post Debridement is  improved. Severity of Tissue Post Debridement is: Fat layer exposed. Post procedure Diagnosis Wound #1: Same as Pre-Procedure Pre-procedure diagnosis of Wound #1 is a Venous Leg Ulcer located on the Left,Medial Lower Leg . There was a Four Layer Compression Therapy Procedure by Shawn Stalleaton, Bobbi, RN. Post procedure Diagnosis Wound #1: Same as Pre-Procedure Plan Follow-up Appointments: Return Appointment in 1 week. - with Dr. Leanord Hawkingobson Cellular or Tissue Based Products: Cellular or Tissue Based Product Type: - run IVR for apligraf Bathing/ Shower/ Hygiene: May shower with protection but do not get wound dressing(s) wet. Edema Control - Lymphedema / SCD / Other: Avoid standing for long periods of time. Exercise regularly Other Edema Control Orders/Instructions: - Whenever sitting elevate legs at least to level of belly button. Additional Orders / Instructions: Follow Nutritious Diet WOUND #1: - Lower Leg Wound Laterality: Left, Medial Cleanser: Soap and Water 1 x Per Week/30 Days Discharge Instructions: May shower and wash wound with dial antibacterial soap and water prior to dressing change. Cleanser: Wound Cleanser 1 x Per Week/30 Days Discharge Instructions: Cleanse the wound with wound cleanser prior to applying a clean dressing using gauze sponges, not tissue or cotton balls. Peri-Wound Care: Triamcinolone 15 (g) 1 x Per Week/30 Days Discharge Instructions: to periwouind mixed with lotion Peri-Wound Care: Sween Lotion (Moisturizing lotion) 1 x Per Week/30 Days Discharge Instructions: Apply to periwound Prim Dressing: IODOFLEX 0.9% Cadexomer Iodine Pad 4x6 cm 1 x Per Week/30 Days ary Discharge Instructions: Apply to wound bed as instructed Secondary Dressing: Woven Gauze Sponge, Non-Sterile 4x4 in 1 x Per Week/30 Days Discharge Instructions: Apply over primary dressing as directed. Secondary Dressing: ABD Pad, 8x10 1 x Per Week/30 Days Discharge Instructions: Apply over primary dressing  as directed. Com pression Wrap: FourPress (4 layer compression wrap) 1 x Per Week/30 Days Discharge Instructions: Apply four layer compression as directed. May also use Miliken CoFlex 2 layer compression system as alternative. 1. I continued the Iodoflex for another week. Ideally I would like to get the surface to a point where I could transition to [facility] 2. Consideration for an advanced treatment product such as Apligraf alone this would also be useful. I am going to put this through her insurance 3. Still under the same compression 4. The wound dimensions are not any better  but certainly the surface is looking gradually better Electronic Signature(s) Signed: 06/04/2021 5:20:40 PM By: Baltazar Najjar MD Entered By: Baltazar Najjar on 06/04/2021 15:30:09 -------------------------------------------------------------------------------- SuperBill Details Patient Name: Date of Service: Pam [name], Pam [name] 06/04/2021 Medical Record Number: [id number] Patient Account Number: [id number] Date of Birth/Sex: Treating RN: [birth year] (80 y.o. Tommye Standard Primary Care Provider: [name] Other Clinician: Referring Provider: Treating Provider/Extender: Angus Palms Weeks in Treatment: 3 Diagnosis Coding ICD-10 Codes Code Description [phone] Chronic venous hypertension (idiopathic) with ulcer and inflammation of left lower extremity L97.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 of skin Facility Procedures CPT4 Code: [id number] Description: 11042 - DEB SUBQ TISSUE 20 SQ CM/< ICD-10 Diagnosis Description L97.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 Modifier: of skin Quantity: 1 CPT4 Code: [id number] Description: 11045 - DEB SUBQ TISS EA ADDL 20CM ICD-10 Diagnosis Description L97.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 Modifier: of skin Quantity:  1 Physician Procedures : CPT4 Code Description Modifier [id number] 11042 - WC PHYS SUBQ TISS 20 SQ CM ICD-10 Diagnosis Description L97.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 of skin Quantity: 1 : [id number] 11045 - WC PHYS SUBQ TISS EA ADDL 20 CM ICD-10 Diagnosis Description L97.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 of skin Quantity: 1 Electronic Signature(s) Signed: 06/04/2021 5:20:40 PM By: Baltazar Najjar MD Entered By: Baltazar Najjar on 06/04/2021 15:30:21

## 2021-06-09 ENCOUNTER — Other Ambulatory Visit: Payer: Self-pay

## 2021-06-09 ENCOUNTER — Encounter (HOSPITAL_BASED_OUTPATIENT_CLINIC_OR_DEPARTMENT_OTHER): Payer: BC Managed Care – PPO | Admitting: Internal Medicine

## 2021-06-09 DIAGNOSIS — I87332 Chronic venous hypertension (idiopathic) with ulcer and inflammation of left lower extremity: Secondary | ICD-10-CM | POA: Diagnosis not present

## 2021-06-09 NOTE — Progress Notes (Signed)
Pam [name] ([id number]) Visit Report for 06/09/2021 Arrival Information Details Patient Name: Date of Service: Pam [name] 06/09/2021 2:00 PM Medical Record Number: [id number] Patient Account Number: [id number] Date of Birth/Sex: Treating RN: Aug 24, 1941 (80 y.o. Pam [name] Primary Care Paislie Tessler: Donald Prose Other Clinician: Referring Messi Twedt: Treating Carle Dargan/Extender: Charyl Dancer Weeks in Treatment: 3 Visit Information History Since Last Visit Added or deleted any medications: No Patient Arrived: Ambulatory Any new allergies or adverse reactions: No Arrival Time: 14:16 Had a fall or experienced change in No Accompanied By: self activities of daily living that may affect Transfer Assistance: None risk of falls: Patient Identification Verified: Yes Signs or symptoms of abuse/neglect since last visito No Secondary Verification Process Completed: Yes Hospitalized since last visit: No Patient Requires Transmission-Based Precautions: No Implantable device outside of the clinic excluding No Patient Has Alerts: No cellular tissue based products placed in the center since last visit: Has Dressing in Place as Prescribed: Yes Has Compression in Place as Prescribed: Yes Pain Present Now: No Electronic Signature(s) Signed: 06/09/2021 6:02:58 PM By: Rhae Hammock RN Entered By: Rhae Hammock on 06/09/2021 14:19:16 -------------------------------------------------------------------------------- Compression Therapy Details Patient Name: Date of Service: Pam [name] 06/09/2021 2:00 PM Medical Record Number: [id number] Patient Account Number: [id number] Date of Birth/Sex: Treating RN: 08/17/1941 (80 y.o. Pam [name] Primary Care Danine Hor: Donald Prose Other Clinician: Referring Safal Halderman: Treating Archit Leger/Extender: Charyl Dancer Weeks in Treatment: 3 Compression Therapy Performed for Wound Assessment: Wound #1 Left,Medial  Lower Leg Performed By: Clinician Levan Hurst, RN Compression Type: Four Layer Post Procedure Diagnosis Same as Pre-procedure Electronic Signature(s) Signed: 06/09/2021 4:56:05 PM By: Levan Hurst RN, BSN Entered By: Levan Hurst on 06/09/2021 14:50:55 -------------------------------------------------------------------------------- Encounter Discharge Information Details Patient Name: Date of Service: Pam [name] 06/09/2021 2:00 PM Medical Record Number: [id number] Patient Account Number: [id number] Date of Birth/Sex: Treating RN: 10/03/1941 (80 y.o. Pam [name] Primary Care Kaison Mcparland: Donald Prose Other Clinician: Referring Cici Rodriges: Treating Lane Eland/Extender: Charyl Dancer Weeks in Treatment: 3 Encounter Discharge Information Items Post Procedure Vitals Discharge Condition: Stable Temperature (F): 97.9 Ambulatory Status: Ambulatory Pulse (bpm): 80 Discharge Destination: Home Respiratory Rate (breaths/min): 20 Transportation: Private Auto Blood Pressure (mmHg): 145/75 Accompanied By: self Schedule Follow-up Appointment: Yes Clinical Summary of Care: Electronic Signature(s) Signed: 06/09/2021 6:10:10 PM By: Deon Pilling Entered By: Deon Pilling on 06/09/2021 18:08:25 -------------------------------------------------------------------------------- Lower Extremity Assessment Details Patient Name: Date of Service: Pam [name] 06/09/2021 2:00 PM Medical Record Number: [id number] Patient Account Number: [id number] Date of Birth/Sex: Treating RN: 12-04-1941 (80 y.o. Pam [name] Primary Care Jeania Nater: Donald Prose Other Clinician: Referring Annesha Delgreco: Treating Demita Tobia/Extender: Charyl Dancer Weeks in Treatment: 3 Edema Assessment Assessed: Shirlyn Goltz: Yes] [Right: No] Edema: [Left: Ye] [Right: s] Calf Left: Right: Point of Measurement: 35 cm From Medial Instep 34 cm Ankle Left: Right: Point of Measurement: 9 cm From  Medial Instep 26.5 cm Vascular Assessment Pulses: Dorsalis Pedis Palpable: [Left:Yes] Posterior Tibial Palpable: [Left:Yes] Electronic Signature(s) Signed: 06/09/2021 6:02:58 PM By: Rhae Hammock RN Entered By: Rhae Hammock on 06/09/2021 14:29:32 -------------------------------------------------------------------------------- Multi Wound Chart Details Patient Name: Date of Service: Pam [name] 06/09/2021 2:00 PM Medical Record Number: [id number] Patient Account Number: [id number] Date of Birth/Sex: Treating RN: 26-May-1941 (80 y.o. Pam [name] Primary Care Jazlynne Milliner: Donald Prose Other Clinician: Referring Yoshika Vensel: Treating Masayuki Sakai/Extender: Charyl Dancer Weeks in Treatment: 3 Vital Signs Height(in): 64 Pulse(bpm): 80 Weight(lbs): 180 Blood Pressure(mmHg): 145/75 Body Mass Index(BMI): 31 Temperature(F): 97.9 Respiratory  Rate(breaths/min): 17 Photos: [1:No Photos Left, Medial Lower Leg] [N/A:N/A N/A] Wound Location: [1:Gradually Appeared] [N/A:N/A] Wounding Event: [1:Venous Leg Ulcer] [N/A:N/A] Primary Etiology: [1:Cataracts, Glaucoma, Hypertension,] [N/A:N/A] Comorbid History: [1:Osteoarthritis 03/14/2021] [N/A:N/A] Date Acquired: [1:3] [N/A:N/A] Weeks of Treatment: [1:Open] [N/A:N/A] Wound Status: [1:4.3x5.2x0.1] [N/A:N/A] Measurements L x W x D (cm) [1:17.562] [N/A:N/A] A (cm) : rea [1:1.756] [N/A:N/A] Volume (cm) : [1:61.80%] [N/A:N/A] % Reduction in Area: [1:61.80%] [N/A:N/A] % Reduction in Volume: [1:Full Thickness With Exposed Support] [N/A:N/A] Classification: [1:Structures Medium] [N/A:N/A] Exudate Amount: [1:Purulent] [N/A:N/A] Exudate Type: [1:yellow, brown, green] [N/A:N/A] Exudate Color: [1:Yes] [N/A:N/A] Foul Odor A Cleansing: [1:fter No] [N/A:N/A] Odor Anticipated Due to Product Use: [1:Distinct, outline attached] [N/A:N/A] Wound Margin: [1:Medium (34-66%)] [N/A:N/A] Granulation A mount: [1:Pink] [N/A:N/A] Granulation  Quality: [1:Medium (34-66%)] [N/A:N/A] Necrotic Amount: [1:Fat Layer (Subcutaneous Tissue): Yes N/A] Exposed Structures: [1:Fascia: No Tendon: No Muscle: No Joint: No Bone: No Small (1-33%)] [N/A:N/A] Epithelialization: [1:Debridement - Excisional] [N/A:N/A] Debridement: Pre-procedure Verification/Time Out 14:48 [N/A:N/A] Taken: [1:Other] [N/A:N/A] Pain Control: [1:Subcutaneous, Slough] [N/A:N/A] Tissue Debrided: [1:Skin/Subcutaneous Tissue] [N/A:N/A] Level: [1:22.36] [N/A:N/A] Debridement A (sq cm): [1:rea Curette] [N/A:N/A] Instrument: [1:Minimum] [N/A:N/A] Bleeding: [1:Pressure] [N/A:N/A] Hemostasis A chieved: [1:4] [N/A:N/A] Procedural Pain: [1:2] [N/A:N/A] Post Procedural Pain: [1:Procedure was tolerated well] [N/A:N/A] Debridement Treatment Response: [1:4.3x5.2x0.1] [N/A:N/A] Post Debridement Measurements L x W x D (cm) [1:1.756] [N/A:N/A] Post Debridement Volume: (cm) [1:Compression Therapy] [N/A:N/A] Procedures Performed: [1:Debridement] Treatment Notes Electronic Signature(s) Signed: 06/09/2021 4:56:05 PM By: Levan Hurst RN, BSN Signed: 06/09/2021 4:58:19 PM By: Linton Ham MD Entered By: Linton Ham on 06/09/2021 14:55:39 -------------------------------------------------------------------------------- Multi-Disciplinary Care Plan Details Patient Name: Date of Service: AMMY, LIENHARD [name] 06/09/2021 2:00 PM Medical Record Number: [id number] Patient Account Number: [id number] Date of Birth/Sex: Treating RN: [birth year] (80 y.o. Pam [name] Primary Care Willella Harding: Donald Prose Other Clinician: Referring Anuhea Gassner: Treating Minh Roanhorse/Extender: Charyl Dancer Weeks in Treatment: 3 Active Inactive Venous Leg Ulcer Nursing Diagnoses: Actual venous Insuffiency (use after diagnosis is confirmed) Goals: Patient will maintain optimal edema control Date Initiated: 05/14/2021 Target Resolution Date: 06/18/2021 Goal Status: Active Patient/caregiver will  verbalize understanding of disease process and disease management Date Initiated: 05/14/2021 Date Inactivated: 06/09/2021 Target Resolution Date: 06/11/2021 Goal Status: Met Interventions: Assess peripheral edema status every visit. Compression as ordered Treatment Activities: Therapeutic compression applied : 05/14/2021 Notes: Wound/Skin Impairment Nursing Diagnoses: Impaired tissue integrity Goals: Patient/caregiver will verbalize understanding of skin care regimen Date Initiated: 05/14/2021 Target Resolution Date: 06/18/2021 Goal Status: Active Ulcer/skin breakdown will have a volume reduction of 30% by week 4 Date Initiated: 05/14/2021 Target Resolution Date: 06/18/2021 Goal Status: Active Interventions: Assess patient/caregiver ability to obtain necessary supplies Assess patient/caregiver ability to perform ulcer/skin care regimen upon admission and as needed Assess ulceration(s) every visit Provide education on ulcer and skin care Treatment Activities: Skin care regimen initiated : 05/14/2021 Topical wound management initiated : 05/14/2021 Notes: Electronic Signature(s) Signed: 06/09/2021 4:56:05 PM By: Levan Hurst RN, BSN Entered By: Levan Hurst on 06/09/2021 14:42:32 -------------------------------------------------------------------------------- Pain Assessment Details Patient Name: Date of Service: GREY, SCHLAUCH [name] 06/09/2021 2:00 PM Medical Record Number: [id number] Patient Account Number: [id number] Date of Birth/Sex: Treating RN: [birth year] (80 y.o. Pam [name] Primary Care Siddharth Babington: Donald Prose Other Clinician: Referring Delanda Bulluck: Treating Zayn Selley/Extender: Charyl Dancer Weeks in Treatment: 3 Active Problems Location of Pain Severity and Description of Pain Patient Has Paino No Site Locations Pain Management and Medication Current Pain Management: Electronic Signature(s) Signed: 06/09/2021 6:02:58 PM By: Rhae Hammock RN Entered By:  Rhae Hammock on  06/09/2021 14:21:22 -------------------------------------------------------------------------------- Patient/Caregiver Education Details Patient Name: Date of Service: EMILEE, MARKET 6/29/2022andnbsp2:00 PM Medical Record Number: [id number] Patient Account Number: [id number] Date of Birth/Gender: Treating RN: Jun 06, 1941 (80 y.o. Pam [name] Primary Care Physician: Donald Prose Other Clinician: Referring Physician: Treating Physician/Extender: Gwendalyn Ege in Treatment: 3 Education Assessment Education Provided To: Patient Education Topics Provided Wound/Skin Impairment: Methods: Explain/Verbal Responses: State content correctly Electronic Signature(s) Signed: 06/09/2021 4:56:05 PM By: Levan Hurst RN, BSN Entered By: Levan Hurst on 06/09/2021 14:42:43 -------------------------------------------------------------------------------- Wound Assessment Details Patient Name: Date of Service: MATSUE, STROM 06/09/2021 2:00 PM Medical Record Number: [id number] Patient Account Number: [id number] Date of Birth/Sex: Treating RN: 1941-10-23 (80 y.o. Pam [name] Primary Care Mallori Araque: Donald Prose Other Clinician: Referring Carley Strickling: Treating Delany Steury/Extender: Charyl Dancer Weeks in Treatment: 3 Wound Status Wound Number: 1 Primary Etiology: Venous Leg Ulcer Wound Location: Left, Medial Lower Leg Wound Status: Open Wounding Event: Gradually Appeared Comorbid History: Cataracts, Glaucoma, Hypertension, Osteoarthritis Date Acquired: 03/14/2021 Weeks Of Treatment: 3 Clustered Wound: No Photos Wound Measurements Length: (cm) 4.3 Width: (cm) 5.2 Depth: (cm) 0.1 Area: (cm) 17.562 Volume: (cm) 1.756 % Reduction in Area: 61.8% % Reduction in Volume: 61.8% Epithelialization: Small (1-33%) Tunneling: No Undermining: No Wound Description Classification: Full Thickness With Exposed Support Structures Wound  Margin: Distinct, outline attached Exudate Amount: Medium Exudate Type: Purulent Exudate Color: yellow, brown, green Foul Odor After Cleansing: Yes Due to Product Use: No Slough/Fibrino Yes Wound Bed Granulation Amount: Medium (34-66%) Exposed Structure Granulation Quality: Pink Fascia Exposed: No Necrotic Amount: Medium (34-66%) Fat Layer (Subcutaneous Tissue) Exposed: Yes Necrotic Quality: Adherent Slough Tendon Exposed: No Muscle Exposed: No Joint Exposed: No Bone Exposed: No Treatment Notes Wound #1 (Lower Leg) Wound Laterality: Left, Medial Cleanser Soap and Water Discharge Instruction: May shower and wash wound with dial antibacterial soap and water prior to dressing change. Wound Cleanser Discharge Instruction: Cleanse the wound with wound cleanser prior to applying a clean dressing using gauze sponges, not tissue or cotton balls. Peri-Wound Care Triamcinolone 15 (g) Discharge Instruction: to periwouind mixed with lotion Sween Lotion (Moisturizing lotion) Discharge Instruction: Apply to periwound Topical Primary Dressing Hydrofera Blue Classic Foam, 4x4 in Discharge Instruction: Moisten with saline prior to applying to wound bed Secondary Dressing Woven Gauze Sponge, Non-Sterile 4x4 in Discharge Instruction: Apply over primary dressing as directed. ABD Pad, 8x10 Discharge Instruction: Apply over primary dressing as directed. Secured With Compression Wrap FourPress (4 layer compression wrap) Discharge Instruction: Apply four layer compression as directed. May also use Miliken CoFlex 2 layer compression system as alternative. Compression Stockings Add-Ons Electronic Signature(s) Signed: 06/09/2021 5:02:46 PM By: Sandre Kitty Signed: 06/09/2021 6:02:58 PM By: Rhae Hammock RN Entered By: Sandre Kitty on 06/09/2021 16:39:56 -------------------------------------------------------------------------------- Vitals Details Patient Name: Date of  Service: ALILA, SOTERO [name] 06/09/2021 2:00 PM Medical Record Number: [id number] Patient Account Number: [id number] Date of Birth/Sex: Treating RN: 1941/08/09 (80 y.o. Pam [name] Primary Care Sanam Marmo: Donald Prose Other Clinician: Referring Kirstin Kugler: Treating Noele Icenhour/Extender: Charyl Dancer Weeks in Treatment: 3 Vital Signs Time Taken: 14:17 Temperature (F): 97.9 Height (in): 64 Pulse (bpm): 80 Weight (lbs): 180 Respiratory Rate (breaths/min): 17 Body Mass Index (BMI): 30.9 Blood Pressure (mmHg): 145/75 Reference Range: 80 - 120 mg / dl Electronic Signature(s) Signed: 06/09/2021 6:02:58 PM By: Rhae Hammock RN Entered By: Rhae Hammock on 06/09/2021 14:20:55

## 2021-06-09 NOTE — Progress Notes (Signed)
Pam [name], Pam [name] ([id number]) Visit Report for 06/09/2021 Debridement Details Patient Name: Date of Service: Pam [name], Pam [name] 06/09/2021 2:00 PM Medical Record Number: [id number] Patient Account Number: [id number] Date of Birth/Sex: Treating RN: [birth year] (80 y.o. Pam [name] Primary Care Provider: Deatra [name] Other Clinician: Referring Provider: Treating Provider/Extender: Pam [name] Weeks in Treatment: 3 Debridement Performed for Assessment: Wound #1 Left,Medial Lower Leg Performed By: Physician Pam Caul., MD Debridement Type: Debridement Severity of Tissue Pre Debridement: Fat layer exposed Level of Consciousness (Pre-procedure): Awake and Alert Pre-procedure Verification/Time Out Yes - 14:48 Taken: Start Time: 14:48 Pain Control: Other : Benzocaine 20% T Area Debrided (L x W): otal 4.3 (cm) x 5.2 (cm) = 22.36 (cm) Tissue and other material debrided: Viable, Non-Viable, Slough, Subcutaneous, Slough Level: Skin/Subcutaneous Tissue Debridement Description: Excisional Instrument: Curette Bleeding: Minimum Hemostasis Achieved: Pressure End Time: 14:50 Procedural Pain: 4 Post Procedural Pain: 2 Response to Treatment: Procedure was tolerated well Level of Consciousness (Post- Awake and Alert procedure): Post Debridement Measurements of Total Wound Length: (cm) 4.3 Width: (cm) 5.2 Depth: (cm) 0.1 Volume: (cm) 1.756 Character of Wound/Ulcer Post Debridement: Requires Further Debridement Severity of Tissue Post Debridement: Fat layer exposed Post Procedure Diagnosis Same as Pre-procedure Electronic Signature(s) Signed: 06/09/2021 4:56:05 PM By: Pam Abts RN, BSN Signed: 06/09/2021 4:58:19 PM By: Pam Najjar MD Entered By: Pam [name] on 06/09/2021 14:55:53 -------------------------------------------------------------------------------- HPI Details Patient Name: Date of Service: Pam [name], Pam [name] 06/09/2021 2:00 PM Medical Record  Number: [id number] Patient Account Number: [id number] Date of Birth/Sex: Treating RN: July 04, 1941 (80 y.o. Pam [name] Primary Care Provider: Deatra [name] Other Clinician: Referring Provider: Treating Provider/Extender: Pam [name] Weeks in Treatment: 3 History of Present Illness HPI Description: ADMISSION 05/14/2021 with This is a 80 year old reasonably healthy woman who is still working at [employer] and [employer] and about to retire at the end of the month. She has had a problem with lower extremity edema for about the last 6 months she tells me. 2 months ago she noted small bumps on both of her legs however the left one medially morphed into a large blister. She was seen by her primary physician Dr. Wynelle [name] on 04/19/2021. Put on doxycycline. He noted at that time that the wound had been weeping for the last 5 weeks. The patient does not have a wound history. 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 We used Iodoflex under compression last week. 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 We have been using Iodoflex to clean up the wound surface. She came in with drainage. 6/24; wound on the left medial lower leg. Using Iodoflex to clean up the wound surface. We are making gradual progress with that. Nursing reported an odor and drainage. 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 Mentioning this because I would like to see about Apligraf 6/29; left medial lower leg using Iodoflex to date. Making gradual improvement with a nonviable surface. 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 Close to ordering Apligraf through her insuranceo Next week Electronic Signature(s)  Signed: 06/09/2021 4:58:19 PM By: Pam Najjar MD Entered By: Pam [name] on 06/09/2021 14:57:47 -------------------------------------------------------------------------------- Physical Exam Details Patient Name: Date of Service: Pam [name], Pam [name] 06/09/2021 2:00 PM Medical Record Number: [id number] Patient Account Number: [id number] Date of Birth/Sex: Treating RN: 10-10-1941 (80 y.o. Pam [name] Primary Care Provider: Deatra [name] Other Clinician: Referring Provider: Treating Provider/Extender: Pam [name] Weeks in Treatment: 3 Constitutional Sitting or standing Blood Pressure is within target range for patient.. Pulse regular and within target range for patient.Marland Kitchen Respirations regular, non-labored and within target range.. Temperature is normal and within the target range for the patient.Marland Kitchen Appears in no distress. Notes Wound exam; left medial lower leg. Continued improvement in the surface but still a very fibrinous adherent film. Very gritty. Still using a #5 curette to remove this. Hemostasis with a pressure dressing. No evidence of surrounding infection Electronic Signature(s) Signed: 06/09/2021 4:58:19 PM By: Pam Najjar MD Entered By: Pam [name] on 06/09/2021 14:59:20 -------------------------------------------------------------------------------- Physician Orders Details Patient Name: Date of Service: Pam [name], Pam [name] 06/09/2021 2:00 PM Medical Record Number: [id number] Patient Account Number: [id number] Date of Birth/Sex: Treating RN: 10/27/1941 (80 y.o. Pam [name] Primary Care Provider: Deatra [name] Other Clinician: Referring Provider: Treating Provider/Extender: Pam [name] Weeks in Treatment: 3 Verbal / Phone Orders: No Diagnosis Coding ICD-10 Coding Code Description [phone]  Chronic venous hypertension (idiopathic) with ulcer and inflammation of left lower extremity L97.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 of skin Follow-up Appointments ppointment in 1 week. - with Dr. Leanord Hawking Return A Bathing/ Shower/ Hygiene May shower with protection but do not get wound dressing(s) wet. Edema Control - Lymphedema / SCD / Other Elevate legs to the level of the heart or above for 30 minutes daily and/or when sitting, a frequency of: - throughout the day Avoid standing for long periods of time. Exercise regularly Additional Orders / Instructions Follow Nutritious Diet Wound Treatment Wound #1 - Lower Leg Wound Laterality: Left, Medial Cleanser: Soap and Water 1 x Per Week/30 Days Discharge Instructions: May shower and wash wound with dial antibacterial soap and water prior to dressing change. Cleanser: Wound Cleanser 1 x Per Week/30 Days Discharge Instructions: Cleanse the wound with wound cleanser prior to applying a clean dressing using gauze sponges, not tissue or cotton balls. Peri-Wound Care: Triamcinolone 15 (g) 1 x Per Week/30 Days Discharge Instructions: to periwouind mixed with lotion Peri-Wound Care: Sween Lotion (Moisturizing lotion) 1 x Per Week/30 Days Discharge Instructions: Apply to periwound Prim Dressing: Hydrofera Blue Classic Foam, 4x4 in 1 x Per Week/30 Days ary Discharge Instructions: Moisten with saline prior to applying to wound bed Secondary Dressing: Woven Gauze Sponge, Non-Sterile 4x4 in 1 x Per Week/30 Days Discharge Instructions: Apply over primary dressing as directed. Secondary Dressing: ABD Pad, 8x10 1 x Per Week/30 Days Discharge Instructions: Apply over primary dressing as directed. Compression Wrap: FourPress (4 layer compression wrap) 1 x Per Week/30 Days Discharge Instructions: Apply four layer compression as directed. May also use Miliken CoFlex 2 layer compression system  as alternative. Electronic Signature(s) Signed: 06/09/2021 4:56:05 PM By: Pam Abts RN, BSN Signed: 06/09/2021 4:58:19 PM By: Pam Najjar MD Entered By: Pam [name] on 06/09/2021 14:52:26 -------------------------------------------------------------------------------- Problem List Details Patient Name: Date of Service: Pam [name], Pam [name] 06/09/2021 2:00 PM Medical Record Number: [id number] Patient Account Number: [id number] Date of Birth/Sex: Treating RN: 01-Jun-1941 (80 y.o. Pam [name] Primary Care Provider: Deatra [name] Other Clinician: Referring Provider: Treating  Provider/Extender: Pam Palmsobson, Krystin Keeven Sun, [name] Weeks in Treatment: 3 Active Problems ICD-10 Encounter Code Description Active Date MDM Diagnosis I87.332 Chronic venous hypertension (idiopathic) with ulcer and inflammation of left 05/14/2021 No Yes lower extremity L97.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 05/14/2021 No Yes of skin Inactive Problems Resolved Problems Electronic Signature(s) Signed: 06/09/2021 4:58:19 PM By: Pam Najjarobson, Jera Headings MD Entered By: Pam Najjarobson, Alma Mohiuddin on 06/09/2021 14:55:30 -------------------------------------------------------------------------------- Progress Note Details Patient Name: Date of Service: Pam [name], Pam [name] 06/09/2021 2:00 PM Medical Record Number: [id number] Patient Account Number: [id number] Date of Birth/Sex: Treating RN: [birth year] (80 y.o. Pam LinkF) [name], [name] Primary Care Provider: Deatra JamesSun, [name] Other Clinician: Referring Provider: Treating Provider/Extender: Pam Palmsobson, Ethlyn Alto Sun, [name] Weeks in Treatment: 3 Subjective History of Present Illness (HPI) ADMISSION 05/14/2021 with This is a 80 year old reasonably healthy woman who is still working at [employer] and about to retire at the end of the month. She has had a problem with lower extremity edema for about the last 6 months she tells me. 2 months ago she noted small bumps  on both of her legs however the left one medially morphed into a large blister. She was seen by her primary physician Dr. Wynelle LinkSun on 04/19/2021. Put on doxycycline. He noted at that time that the wound had been weeping for the last 5 weeks. The patient does not have a wound history. 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 We used Iodoflex under compression last week. 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 We have been using Iodoflex to clean up the wound surface. She came in with drainage. 6/24; wound on the left medial lower leg. Using Iodoflex to clean up the wound surface. We are making gradual progress with that. Nursing reported an odor and drainage. 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 Mentioning this because I would like to see about Apligraf 6/29; left medial lower leg using Iodoflex to date. Making gradual improvement with a nonviable surface. 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 Close to ordering Apligraf through her insuranceo Next week Objective Constitutional Sitting or standing Blood Pressure is within target range for patient.. Pulse regular and within target range for patient.Marland Kitchen. Respirations regular, non-labored and within target range.. Temperature is normal and within the target range for the patient.Marland Kitchen. Appears in no distress. Vitals Time Taken: 2:17 PM, Height: 64 in, Weight: 180 lbs, BMI: 30.9, Temperature: 97.9 F, Pulse: 80 bpm, Respiratory Rate: 17 breaths/min, Blood Pressure: 145/75 mmHg. General  Notes: Wound exam; left medial lower leg. Continued improvement in the surface but still a very fibrinous adherent film. Very gritty. Still using a #5 curette to remove this. Hemostasis with a pressure dressing. No evidence of surrounding infection Integumentary (Hair, Skin) Wound #1 status is Open. Original cause of wound was Gradually Appeared. The date acquired was: 03/14/2021. The wound has been in treatment 3 weeks. The wound is located on the Left,Medial Lower Leg. The wound measures 4.3cm length x 5.2cm width x 0.1cm depth; 17.562cm^2 area and 1.756cm^3 volume.  There is Fat Layer (Subcutaneous Tissue) exposed. There is no tunneling or undermining noted. There is a medium amount of purulent drainage noted. Foul odor after cleansing was noted. The wound margin is distinct with the outline attached to the wound base. There is medium (34-66%) pink granulation within the wound bed. There is a medium (34-66%) amount of necrotic tissue within the wound bed including Adherent Slough. 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 There was a Excisional Skin/Subcutaneous Tissue Debridement with a total area of 22.36 sq cm performed by Pam Caul., MD. With the following instrument(s): Curette to remove Viable and Non-Viable tissue/material. Material removed includes Subcutaneous Tissue and Slough and after achieving pain control using Other (Benzocaine 20%). No specimens were taken. A time out was conducted at 14:48, prior to the start of the procedure. A Minimum amount of bleeding was controlled with Pressure. The procedure was tolerated well with a pain  level of 4 throughout and a pain level of 2 following the procedure. Post Debridement Measurements: 4.3cm length x 5.2cm width x 0.1cm depth; 1.756cm^3 volume. Character of Wound/Ulcer Post Debridement requires further debridement. Severity of Tissue Post Debridement is: Fat layer exposed. Post procedure Diagnosis Wound #1: Same as Pre-Procedure Pre-procedure diagnosis of Wound #1 is a Venous Leg Ulcer located on the Left,Medial Lower Leg . There was a Four Layer Compression Therapy Procedure by Pam Abts, RN. Post procedure Diagnosis Wound #1: Same as Pre-Procedure Plan Follow-up Appointments: Return Appointment in 1 week. - with Dr. Chauncey Mann Shower/ Hygiene: May shower with protection but do not get wound dressing(s) wet. Edema Control - Lymphedema / SCD / Other: Elevate legs to the level of the heart or above for 30 minutes daily and/or when sitting, a frequency of: - throughout the day Avoid standing for long periods of time. Exercise regularly Additional Orders / Instructions: Follow Nutritious Diet WOUND #1: - Lower Leg Wound Laterality: Left, Medial Cleanser: Soap and Water 1 x Per Week/30 Days Discharge Instructions: May shower and wash wound with dial antibacterial soap and water prior to dressing change. Cleanser: Wound Cleanser 1 x Per Week/30 Days Discharge Instructions: Cleanse the wound with wound cleanser prior to applying a clean dressing using gauze sponges, not tissue or cotton balls. Peri-Wound Care: Triamcinolone 15 (g) 1 x Per Week/30 Days Discharge Instructions: to periwouind mixed with lotion Peri-Wound Care: Sween Lotion (Moisturizing lotion) 1 x Per Week/30 Days Discharge Instructions: Apply to periwound Prim Dressing: Hydrofera Blue Classic Foam, 4x4 in 1 x Per Week/30 Days ary Discharge Instructions: Moisten with saline prior to applying to wound bed Secondary Dressing: Woven Gauze Sponge, Non-Sterile 4x4 in 1 x Per Week/30 Days Discharge  Instructions: Apply over primary dressing as directed. Secondary Dressing: ABD Pad, 8x10 1 x Per Week/30 Days Discharge Instructions: Apply over primary dressing as directed. Com pression Wrap: FourPress (4 layer compression wrap) 1 x Per Week/30 Days Discharge Instructions: Apply four layer compression as directed. May also use Miliken CoFlex 2 layer compression system as alternative. 1. I change the primary dressing to Hydrofera Blue. Still hoping for a better looking wound surface prior to proceeding with Apligraf. 2. No major change in area. 3. She does not  have uncontrolled edema, pedal pulses are palpable Electronic Signature(s) Signed: 06/09/2021 4:58:19 PM By: Pam Najjar MD Entered By: Pam [name] on 06/09/2021 15:03:53 -------------------------------------------------------------------------------- SuperBill Details Patient Name: Date of Service: Pam [name], Pam [name] 06/09/2021 Medical Record Number: [id number] Patient Account Number: [id number] Date of Birth/Sex: Treating RN: Nov 05, 1941 (80 y.o. Pam [name] Primary Care Provider: Deatra [name] Other Clinician: Referring Provider: Treating Provider/Extender: Pam [name] Weeks in Treatment: 3 Diagnosis Coding ICD-10 Codes Code Description [phone] Chronic venous hypertension (idiopathic) with ulcer and inflammation of left lower extremity L97.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 of skin Facility Procedures CPT4 Code: [id number] Description: 11042 - DEB SUBQ TISSUE 20 SQ CM/< ICD-10 Diagnosis Description L97.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 Modifier: of skin Quantity: 1 CPT4 Code: [id number] Description: 11045 - DEB SUBQ TISS EA ADDL 20CM ICD-10 Diagnosis Description L97.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 Modifier: of skin Quantity: 1 Physician Procedures :  CPT4 Code Description Modifier [id number] 11042 - WC PHYS SUBQ TISS 20 SQ CM ICD-10 Diagnosis Description L97.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 of skin Quantity: 1 : [id number] 11045 - WC PHYS SUBQ TISS EA ADDL 20 CM ICD-10 Diagnosis Description L97.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 of skin Quantity: 1 Electronic Signature(s) Signed: 06/09/2021 4:58:19 PM By: Pam Najjar MD Entered By: Pam [name] on 06/09/2021 15:04:13

## 2021-06-16 ENCOUNTER — Other Ambulatory Visit: Payer: Self-pay

## 2021-06-16 ENCOUNTER — Encounter (HOSPITAL_BASED_OUTPATIENT_CLINIC_OR_DEPARTMENT_OTHER): Payer: Medicare Other | Attending: Physician Assistant | Admitting: Internal Medicine

## 2021-06-16 DIAGNOSIS — L97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 of skin: Secondary | ICD-10-CM | POA: Diagnosis not present

## 2021-06-16 DIAGNOSIS — I87332 Chronic venous hypertension (idiopathic) with ulcer and inflammation of left lower extremity: Secondary | ICD-10-CM | POA: Insufficient documentation

## 2021-06-16 NOTE — Progress Notes (Signed)
Pam [name], Pam [name] ([id number]) Visit Report for 06/16/2021 Arrival Information Details Patient Name: Date of Service: Pam [name] 06/16/2021 1:45 PM Medical Record Number: [id number] Patient Account Number: [id number] Date of Birth/Sex: Treating RN: 12/09/1941 (80 y.o. F) Deaton, Bobbi Primary Care Provider: Sun, [name] Other Clinician: Referring Provider: Treating Provider/Extender: Robson, Michael Sun, [name] Weeks in Treatment: 4 Visit Information History Since Last Visit Added or deleted any medications: No Patient Arrived: Ambulatory Any new allergies or adverse reactions: No Arrival Time: 14:25 Had a fall or experienced change in No Accompanied By: self activities of daily living that may affect Transfer Assistance: None risk of falls: Patient Identification Verified: Yes Signs or symptoms of abuse/neglect since last visito No Secondary Verification Process Completed: Yes Hospitalized since last visit: No Patient Requires Transmission-Based Precautions: No Implantable device outside of the clinic excluding No Patient Has Alerts: No cellular tissue based products placed in the center since last visit: Has Dressing in Place as Prescribed: Yes Has Compression in Place as Prescribed: Yes Pain Present Now: No Electronic Signature(s) Signed: 06/16/2021 6:50:03 PM By: Deaton, Bobbi Entered By: Deaton, Bobbi on 06/16/2021 14:26:12 -------------------------------------------------------------------------------- Compression Therapy Details Patient Name: Date of Service: Pam [name] 06/16/2021 1:45 PM Medical Record Number: [id number] Patient Account Number: [id number] Date of Birth/Sex: Treating RN: 04/06/1941 (80 y.o. F) [name], [name] Primary Care Provider: Sun, [name] Other Clinician: Referring Provider: Treating Provider/Extender: Robson, Michael Sun, [name] Weeks in Treatment: 4 Compression Therapy Performed for Wound Assessment: Wound #1 Left,Medial Lower Leg Performed  By: Clinician [name], [name], RN Compression Type: Four Layer Post Procedure Diagnosis Same as Pre-procedure Electronic Signature(s) Signed: 06/16/2021 7:11:13 PM By: [name], [name] RN, BSN Entered By: [name], [name] on 06/16/2021 15:10:29 -------------------------------------------------------------------------------- Encounter Discharge Information Details Patient Name: Date of Service: Pam [name] 06/16/2021 1:45 PM Medical Record Number: [id number] Patient Account Number: [id number] Date of Birth/Sex: Treating RN: 10/21/1941 (80 y.o. F) Deaton, Bobbi Primary Care Provider: Sun, [name] Other Clinician: Referring Provider: Treating Provider/Extender: Robson, Michael Sun, [name] Weeks in Treatment: 4 Encounter Discharge Information Items Discharge Condition: Stable Ambulatory Status: Ambulatory Discharge Destination: Home Transportation: Private Auto Accompanied By: self Schedule Follow-up Appointment: Yes Clinical Summary of Care: Electronic Signature(s) Signed: 06/16/2021 6:50:03 PM By: Deaton, Bobbi Entered By: Deaton, Bobbi on 06/16/2021 17:58:05 -------------------------------------------------------------------------------- Lower Extremity Assessment Details Patient Name: Date of Service: Pam [name] 06/16/2021 1:45 PM Medical Record Number: [id number] Patient Account Number: [id number] Date of Birth/Sex: Treating RN: 07/27/1941 (80 y.o. F) Deaton, Bobbi Primary Care Provider: Sun, [name] Other Clinician: Referring Provider: Treating Provider/Extender: Robson, Michael Sun, [name] Weeks in Treatment: 4 Edema Assessment Assessed: [Left: Yes] [Right: No] Edema: [Left: N] [Right: o] Calf Left: Right: Point of Measurement: 35 cm From Medial Instep 31 cm Ankle Left: Right: Point of Measurement: 9 cm From Medial Instep 22 cm Vascular Assessment Pulses: Dorsalis Pedis Palpable: [Left:Yes] Electronic Signature(s) Signed: 06/16/2021 6:50:03 PM By: Deaton,  Bobbi Entered By: Deaton, Bobbi on 06/16/2021 14:34:55 -------------------------------------------------------------------------------- Multi Wound Chart Details Patient Name: Date of Service: Pam [name] 06/16/2021 1:45 PM Medical Record Number: [id number] Patient Account Number: [id number] Date of Birth/Sex: Treating RN: 08/30/1941 (80 y.o. F) [name], [name] Primary Care Provider: Sun, [name] Other Clinician: Referring Provider: Treating Provider/Extender: Robson, Michael Sun, [name] Weeks in Treatment: 4 Vital Signs Height(in): 64 Pulse(bpm): 90 Weight(lbs): 180 Blood Pressure(mmHg): 167/87 Body Mass Index(BMI): 31 Temperature(°F): 97.9 Respiratory Rate(breaths/min): 20 Photos: [1:No Photos Left, Medial Lower Leg] [N/A:N/A N/A] Wound Location: [1:Gradually Appeared] [N/A:N/A] Wounding Event: [1:Venous Leg Ulcer] [N/A:N/A] Primary   Etiology: [1:Cataracts, Glaucoma, Hypertension, N/A] Comorbid History: [1:Osteoarthritis 03/14/2021] [N/A:N/A] Date Acquired: [1:4] [N/A:N/A] Weeks of Treatment: [1:Open] [N/A:N/A] Wound Status: [1:3.6x4.7x0.2] [N/A:N/A] Measurements L x W x D (cm) [1:13.289] [N/A:N/A] A (cm) : rea [1:2.658] [N/A:N/A] Volume (cm) : [1:71.10%] [N/A:N/A] % Reduction in Area: [1:42.20%] [N/A:N/A] % Reduction in Volume: [1:Full Thickness With Exposed Support N/A] Classification: [1:Structures Medium] [N/A:N/A] Exudate Amount: [1:Serosanguineous] [N/A:N/A] Exudate Type: [1:red, brown] [N/A:N/A] Exudate Color: [1:Distinct, outline attached] [N/A:N/A] Wound Margin: [1:Large (67-100%)] [N/A:N/A] Granulation Amount: [1:Pink] [N/A:N/A] Granulation Quality: [1:Small (1-33%)] [N/A:N/A] Necrotic Amount: [1:Fat Layer (Subcutaneous Tissue): Yes N/A] Exposed Structures: [1:Fascia: No Tendon: No Muscle: No Joint: No Bone: No Small (1-33%)] [N/A:N/A] Epithelialization: [1:Compression Therapy] [N/A:N/A] Treatment Notes Electronic Signature(s) Signed: 06/16/2021 5:13:41 PM  By: Robson, Michael MD Signed: 06/16/2021 7:11:13 PM By: [name], [name] RN, BSN Entered By: Robson, Michael on 06/16/2021 15:16:08 -------------------------------------------------------------------------------- Multi-Disciplinary Care Plan Details Patient Name: Date of Service: Pam [name] 06/16/2021 1:45 PM Medical Record Number: [id number] Patient Account Number: [id number] Date of Birth/Sex: Treating RN: 04/13/1941 (80 y.o. F) [name], [name] Primary Care Provider: Sun, [name] Other Clinician: Referring Provider: Treating Provider/Extender: Robson, Michael Sun, [name] Weeks in Treatment: 4 Active Inactive Venous Leg Ulcer Nursing Diagnoses: Actual venous Insuffiency (use after diagnosis is confirmed) Goals: Patient will maintain optimal edema control Date Initiated: 05/14/2021 Target Resolution Date: 07/16/2021 Goal Status: Active Patient/caregiver will verbalize understanding of disease process and disease management Date Initiated: 05/14/2021 Date Inactivated: 06/09/2021 Target Resolution Date: 06/11/2021 Goal Status: Met Interventions: Assess peripheral edema status every visit. Compression as ordered Treatment Activities: Therapeutic compression applied : 05/14/2021 Notes: Wound/Skin Impairment Nursing Diagnoses: Impaired tissue integrity Goals: Patient/caregiver will verbalize understanding of skin care regimen Date Initiated: 05/14/2021 Target Resolution Date: 07/16/2021 Goal Status: Active Ulcer/skin breakdown will have a volume reduction of 30% by week 4 Date Initiated: 05/14/2021 Date Inactivated: 06/16/2021 Target Resolution Date: 06/18/2021 Goal Status: Met Interventions: Assess patient/caregiver ability to obtain necessary supplies Assess patient/caregiver ability to perform ulcer/skin care regimen upon admission and as needed Assess ulceration(s) every visit Provide education on ulcer and skin care Treatment Activities: Skin care regimen initiated : 05/14/2021 Topical  wound management initiated : 05/14/2021 Notes: Electronic Signature(s) Signed: 06/16/2021 7:11:13 PM By: [name], [name] RN, BSN Entered By: [name], [name] on 06/16/2021 18:31:25 -------------------------------------------------------------------------------- Pain Assessment Details Patient Name: Date of Service: Degen, JA [name] 06/16/2021 1:45 PM Medical Record Number: [id number] Patient Account Number: [id number] Date of Birth/Sex: Treating RN: 09/18/1941 (80 y.o. F) Deaton, Bobbi Primary Care Provider: Sun, [name] Other Clinician: Referring Provider: Treating Provider/Extender: Robson, Michael Sun, [name] Weeks in Treatment: 4 Active Problems Location of Pain Severity and Description of Pain Patient Has Paino No Site Locations Rate the pain. Current Pain Level: 0 Pain Management and Medication Current Pain Management: Medication: No Cold Application: No Rest: No Massage: No Activity: No T.E.N.S.: No Heat Application: No Leg drop or elevation: No Is the Current Pain Management Adequate: Adequate How does your wound impact your activities of daily livingo Sleep: No Bathing: No Appetite: No Relationship With Others: No Bladder Continence: No Emotions: No Bowel Continence: No Work: No Toileting: No Drive: No Dressing: No Hobbies: No Electronic Signature(s) Signed: 06/16/2021 6:50:03 PM By: Deaton, Bobbi Entered By: Deaton, Bobbi on 06/16/2021 14:26:39 -------------------------------------------------------------------------------- Patient/Caregiver Education Details Patient Name: Date of Service: Agustin, JA [name] 7/6/2022andnbsp1:45 PM Medical Record Number: [id number] Patient Account Number: [id number] Date of Birth/Gender: Treating RN: 10/23/1941 (80 y.o. F) [name], [name] Primary Care Physician: Sun, [name] Other Clinician: Referring Physician: Treating   Physician/Extender: Charyl Dancer Weeks in Treatment: 4 Education Assessment Education  Provided To: Patient Education Topics Provided Wound/Skin Impairment: Methods: Explain/Verbal Responses: State content correctly Electronic Signature(s) Signed: 06/16/2021 7:11:13 PM By: Levan Hurst RN, BSN Signed: 06/16/2021 7:11:13 PM By: Levan Hurst RN, BSN Entered By: Levan Hurst on 06/16/2021 18:31:38 -------------------------------------------------------------------------------- Wound Assessment Details Patient Name: Date of Service: KAYLEENA, EKE 06/16/2021 1:45 PM Medical Record Number: [id number] Patient Account Number: [id number] Date of Birth/Sex: Treating RN: [birth year] (80 y.o. Helene Shoe, Tammi Klippel Primary Care Malachi Suderman: Donald Prose Other Clinician: Referring Suzie Vandam: Treating Laresa Oshiro/Extender: Charyl Dancer Weeks in Treatment: 4 Wound Status Wound Number: 1 Primary Etiology: Venous Leg Ulcer Wound Location: Left, Medial Lower Leg Wound Status: Open Wounding Event: Gradually Appeared Comorbid History: Cataracts, Glaucoma, Hypertension, Osteoarthritis Date Acquired: 03/14/2021 Weeks Of Treatment: 4 Clustered Wound: No Photos Wound Measurements Length: (cm) 3.6 Width: (cm) 4.7 Depth: (cm) 0.2 Area: (cm) 13.289 Volume: (cm) 2.658 % Reduction in Area: 71.1% % Reduction in Volume: 42.2% Epithelialization: Small (1-33%) Tunneling: No Undermining: No Wound Description Classification: Full Thickness With Exposed Support Structures Wound Margin: Distinct, outline attached Exudate Amount: Medium Exudate Type: Serosanguineous Exudate Color: red, brown Foul Odor After Cleansing: No Slough/Fibrino Yes Wound Bed Granulation Amount: Large (67-100%) Exposed Structure Granulation Quality: Pink Fascia Exposed: No Necrotic Amount: Small (1-33%) Fat Layer (Subcutaneous Tissue) Exposed: Yes Necrotic Quality: Adherent Slough Tendon Exposed: No Muscle Exposed: No Joint Exposed: No Bone Exposed: No Treatment Notes Wound #1 (Lower Leg) Wound  Laterality: Left, Medial Cleanser Soap and Water Discharge Instruction: May shower and wash wound with dial antibacterial soap and water prior to dressing change. Wound Cleanser Discharge Instruction: Cleanse the wound with wound cleanser prior to applying a clean dressing using gauze sponges, not tissue or cotton balls. Peri-Wound Care Triamcinolone 15 (g) Discharge Instruction: to periwouind mixed with lotion Sween Lotion (Moisturizing lotion) Discharge Instruction: Apply to periwound Topical Primary Dressing Hydrofera Blue Classic Foam, 4x4 in Discharge Instruction: Moisten with saline prior to applying to wound bed Secondary Dressing Woven Gauze Sponge, Non-Sterile 4x4 in Discharge Instruction: Apply over primary dressing as directed. ABD Pad, 8x10 Discharge Instruction: Apply over primary dressing as directed. Secured With Compression Wrap FourPress (4 layer compression wrap) Discharge Instruction: Apply four layer compression as directed. May also use Miliken CoFlex 2 layer compression system as alternative. Compression Stockings Add-Ons Electronic Signature(s) Signed: 06/16/2021 5:22:20 PM By: Sandre Kitty Signed: 06/16/2021 6:50:03 PM By: Deon Pilling Entered By: Sandre Kitty on 06/16/2021 17:09:08 -------------------------------------------------------------------------------- Vitals Details Patient Name: Date of Service: CHRISTALYNN, BOISE [name] 06/16/2021 1:45 PM Medical Record Number: [id number] Patient Account Number: [id number] Date of Birth/Sex: Treating RN: 1941-07-23 (80 y.o. Helene Shoe, Tammi Klippel Primary Care Shakesha Soltau: Donald Prose Other Clinician: Referring Estes Lehner: Treating Shloima Clinch/Extender: Charyl Dancer Weeks in Treatment: 4 Vital Signs Time Taken: 14:25 Temperature (F): 97.9 Height (in): 64 Pulse (bpm): 90 Weight (lbs): 180 Respiratory Rate (breaths/min): 20 Body Mass Index (BMI): 30.9 Blood Pressure (mmHg): 167/87 Reference Range: 80 -  120 mg / dl Electronic Signature(s) Signed: 06/16/2021 6:50:03 PM By: Deon Pilling Entered By: Deon Pilling on 06/16/2021 14:26:30

## 2021-06-17 NOTE — Progress Notes (Signed)
SHATERICA, MCCLATCHY ([id number]) Visit Report for 06/16/2021 HPI Details Patient Name: Date of Service: Pam [name], Pam [name] 06/16/2021 1:45 PM Medical Record Number: [id number] Patient Account Number: [id number] Date of Birth/Sex: Treating RN: [birth year] (80 y.o. Pam [name] Primary Care Provider: [name] Other Clinician: Referring Provider: Treating Provider/Extender: Angus Palms Weeks in Treatment: 4 History of Present Illness HPI Description: ADMISSION 05/14/2021 with This is a 80 year old reasonably healthy woman who is still working at [employer] and about to retire at the end of the month. She has had a problem with lower extremity edema for about the last 6 months she tells me. 2 months ago she noted small bumps on both of her legs however the left one medially morphed into a large blister. She was seen by her primary physician Dr. Wynelle [name] on 04/19/2021. Put on doxycycline. He noted at that time that the wound had been weeping for the last 5 weeks. The patient does not have a wound history. 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 We used Iodoflex under compression last week. 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 We have been using Iodoflex to clean up the wound surface. She came in with drainage. 6/24; wound on the left medial lower leg. Using Iodoflex to clean up the wound surface. We are making gradual progress with that. Nursing reported an odor and drainage. 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 Mentioning this because I would  like to see about Apligraf 6/29; left medial lower leg using Iodoflex to date. Making gradual improvement with a nonviable surface. 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 Close to ordering Apligraf through her insuranceo Next week 7/6; still have not heard about Apligraf through her insurance. She is making gradual improvement in the condition of this wound bed. She is retired and is now under [employer]. We have been using Hydrofera Blue under compression This is a fairly large wound. I have suspected that this was a venous insufficiency ulcer. As it is not really improving with standard therapy I have elected to go ahead and order venous reflux studies today Electronic Signature(s) Signed: 06/16/2021 5:13:41 PM By: Baltazar Najjar MD Entered By: Baltazar Najjar on 06/16/2021 15:17:20 -------------------------------------------------------------------------------- Physical Exam Details Patient Name: Date of Service: Pam, [name] 06/16/2021 1:45 PM Medical Record Number: [id number] Patient Account Number: [id number] Date of Birth/Sex: Treating RN: 05-12-1941 (80 y.o. Pam [name] Primary Care Provider: [name] Other Clinician: Referring Provider: Treating Provider/Extender: Angus Palms Weeks in Treatment: 4 Constitutional Patient is hypertensive.. Pulse regular and within target range for patient.Marland Kitchen Respirations regular, non-labored and within target range.. Temperature is normal and within the target range for the patient.Marland Kitchen Appears in no distress. Cardiovascular Pedal pulses are palpable on the left. Notes Wound exam; left medial lower leg. The wound surface generally looks better. She asked me not to debride this today and I honored her request although she has had a gritty fibrinous surface in the past. There is no evidence of surrounding infection we have good  edema control Electronic Signature(s) Signed: 06/16/2021 5:13:41 PM By:  Baltazar Najjar MD Entered By: Baltazar Najjar on 06/16/2021 15:18:43 -------------------------------------------------------------------------------- Physician Orders Details Patient Name: Date of Service: Pam, [name] 06/16/2021 1:45 PM Medical Record Number: [id number] Patient Account Number: [id number] Date of Birth/Sex: Treating RN: 10-19-1941 (80 y.o. Pam [name] Primary Care Provider: [name] Other Clinician: Referring Provider: Treating Provider/Extender: Angus Palms Weeks in Treatment: 4 Verbal / Phone Orders: No Diagnosis Coding ICD-10 Coding Code Description [phone] Chronic venous hypertension (idiopathic) with ulcer and inflammation of left lower extremity L97.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 of skin Follow-up Appointments ppointment in 1 week. - with Dr. Leanord Hawking Return A Bathing/ Shower/ Hygiene May shower with protection but do not get wound dressing(s) wet. Edema Control - Lymphedema / SCD / Other Elevate legs to the level of the heart or above for 30 minutes daily and/or when sitting, a frequency of: - throughout the day Avoid standing for long periods of time. Exercise regularly Additional Orders / Instructions Follow Nutritious Diet Wound Treatment Wound #1 - Lower Leg Wound Laterality: Left, Medial Cleanser: Soap and Water 1 x Per Week/30 Days Discharge Instructions: May shower and wash wound with dial antibacterial soap and water prior to dressing change. Cleanser: Wound Cleanser 1 x Per Week/30 Days Discharge Instructions: Cleanse the wound with wound cleanser prior to applying a clean dressing using gauze sponges, not tissue or cotton balls. Peri-Wound Care: Triamcinolone 15 (g) 1 x Per Week/30 Days Discharge Instructions: to periwouind mixed with lotion Peri-Wound Care: Sween Lotion (Moisturizing lotion) 1 x Per Week/30 Days Discharge Instructions: Apply to periwound Prim  Dressing: Hydrofera Blue Classic Foam, 4x4 in 1 x Per Week/30 Days ary Discharge Instructions: Moisten with saline prior to applying to wound bed Secondary Dressing: Woven Gauze Sponge, Non-Sterile 4x4 in 1 x Per Week/30 Days Discharge Instructions: Apply over primary dressing as directed. Secondary Dressing: ABD Pad, 8x10 1 x Per Week/30 Days Discharge Instructions: Apply over primary dressing as directed. Compression Wrap: FourPress (4 layer compression wrap) 1 x Per Week/30 Days Discharge Instructions: Apply four layer compression as directed. May also use Miliken CoFlex 2 layer compression system as alternative. Services and Therapies Venous Reflux Studies -Unilateral, left leg - Non healing wound on left medial lower leg - (ICD10 I87.332 - Chronic venous hypertension (idiopathic) with ulcer and inflammation of left lower extremity) Electronic Signature(s) Signed: 06/16/2021 5:13:41 PM By: Baltazar Najjar MD Signed: 06/16/2021 7:11:13 PM By: Zandra Abts RN, BSN Entered By: Zandra Abts on 06/16/2021 15:07:36 -------------------------------------------------------------------------------- Problem List Details Patient Name: Date of Service: URVI, IMES 06/16/2021 1:45 PM Medical Record Number: [id number] Patient Account Number: [id number] Date of Birth/Sex: Treating RN: 06/21/1941 (80 y.o. Pam [name] Primary Care Provider: [name] Other Clinician: Referring Provider: Treating Provider/Extender: Angus Palms Weeks in Treatment: 4 Active Problems ICD-10 Encounter Code Description Active Date MDM Diagnosis I87.332 Chronic venous hypertension (idiopathic) with ulcer and inflammation of left 05/14/2021 No Yes lower extremity L97.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 05/14/2021 No Yes of skin Inactive Problems Resolved Problems Electronic Signature(s) Signed: 06/16/2021 5:13:41 PM By: Baltazar Najjar  MD Entered By: Baltazar Najjar on 06/16/2021 15:14:29 -------------------------------------------------------------------------------- Progress Note Details Patient Name: Date of Service: TALETHA, TWIFORD NELL 06/16/2021 1:45 PM Medical Record Number: [id number] Patient Account Number: [id number] Date of Birth/Sex: Treating RN: June 23, 1941 (80 y.o. Pam [name] Primary Care Provider:  Deatra JamesSun, [name] Other Clinician: Referring Provider: Treating Provider/Extender: Angus Palmsobson, Zinia Innocent Sun, [name] Weeks in Treatment: 4 Subjective History of Present Illness (HPI) ADMISSION 05/14/2021 with This is a 80 year old reasonably healthy woman who is still working at [employer] and about to retire at the end of the month. She has had a problem with lower extremity edema for about the last 6 months she tells me. 2 months ago she noted small bumps on both of her legs however the left one medially morphed into a large blister. She was seen by her primary physician Dr. Wynelle LinkSun on 04/19/2021. Put on doxycycline. He noted at that time that the wound had been weeping for the last 5 weeks. The patient does not have a wound history. 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 We used Iodoflex under compression last week. 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 We have been using Iodoflex to clean up the wound surface. She came in with drainage. 6/24; wound on the left medial lower leg. Using Iodoflex to clean up the wound surface. We are making gradual progress with that. Nursing reported an odor and drainage. She is still working and has commercial [employer] insurance but will transition to [employer] at the end of July. Mentioning this because I would like to see about Apligraf 6/29; left medial lower leg using Iodoflex to date. Making gradual improvement with a nonviable surface. 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 Close to ordering Apligraf through her insuranceo Next week 7/6; still have not heard about Apligraf through her insurance. She is making gradual improvement in the condition of this wound bed. She is retired and is now under [employer]. We have been using Hydrofera Blue under compression This is a fairly large wound. I have suspected that this was a venous insufficiency ulcer. As it is not really improving with standard therapy I have elected to go ahead and order venous reflux studies today Objective Constitutional Patient is hypertensive.. Pulse regular and within target range for patient.Marland Kitchen. Respirations regular, non-labored and within target range.. Temperature is normal and within the target range for the patient.Marland Kitchen. Appears in no distress. Vitals Time Taken: 2:25 PM, Height: 64 in, Weight: 180 lbs, BMI: 30.9, Temperature: 97.9 F, Pulse: 90 bpm, Respiratory Rate: 20 breaths/min, Blood Pressure: 167/87 mmHg. Cardiovascular Pedal pulses are palpable on the left. General Notes: Wound exam; left medial lower leg. The wound surface generally looks better. She asked me not to debride this today and I honored her request although she has had a gritty fibrinous surface in the past. There is no evidence of surrounding infection we have good edema control Integumentary (Hair, Skin) Wound #1 status is Open. Original cause of wound was Gradually Appeared. The date acquired was: 03/14/2021. The wound has been in treatment 4 weeks. The wound is located on the Left,Medial Lower Leg. The wound measures 3.6cm length x 4.7cm width x 0.2cm depth; 13.289cm^2 area and 2.658cm^3 volume. There is Fat Layer (Subcutaneous  Tissue) exposed. There is no tunneling or undermining noted. There is a medium amount of serosanguineous drainage noted. The wound margin is  distinct with the outline attached to the wound base. There is large (67-100%) pink granulation within the wound bed. There is a small (1- 33%) amount of necrotic tissue within the wound bed including Adherent Slough. 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 There was a Four Layer Compression Therapy Procedure by Zandra Abts, RN. Post procedure Diagnosis Wound #1: Same as Pre-Procedure Plan Follow-up Appointments: Return Appointment in 1 week. - with Dr. Chauncey Mann Shower/ Hygiene: May shower with protection but do not get wound dressing(s) wet. Edema Control - Lymphedema / SCD / Other: Elevate legs to the level of the heart or above for 30 minutes daily and/or when sitting, a frequency of: - throughout the day Avoid standing for long periods of time. Exercise regularly Additional Orders / Instructions: Follow Nutritious Diet Services and Therapies ordered were: Venous Reflux Studies -Unilateral, left leg - Non healing wound on left medial lower leg WOUND #1: - Lower Leg Wound Laterality: Left, Medial Cleanser: Soap and Water 1 x Per Week/30 Days Discharge Instructions: May shower and wash wound with dial antibacterial soap and water prior to dressing change. Cleanser: Wound Cleanser 1 x Per Week/30 Days Discharge Instructions: Cleanse the wound with wound cleanser prior to applying a clean dressing using gauze sponges, not tissue or cotton balls. Peri-Wound Care: Triamcinolone 15 (g) 1 x Per Week/30 Days Discharge Instructions: to periwouind mixed with  lotion Peri-Wound Care: Sween Lotion (Moisturizing lotion) 1 x Per Week/30 Days Discharge Instructions: Apply to periwound Prim Dressing: Hydrofera Blue Classic Foam, 4x4 in 1 x Per Week/30 Days ary Discharge Instructions: Moisten with saline prior to applying to wound bed Secondary Dressing: Woven Gauze Sponge, Non-Sterile 4x4 in 1 x Per Week/30 Days Discharge Instructions: Apply over primary dressing as directed. Secondary Dressing: ABD Pad, 8x10 1 x Per Week/30 Days Discharge Instructions: Apply over primary dressing as directed. Com pression Wrap: FourPress (4 layer compression wrap) 1 x Per Week/30 Days Discharge Instructions: Apply four layer compression as directed. May also use Miliken CoFlex 2 layer compression system as alternative. 1. I have ordered venous reflux studies 2. Still awaiting Apligraf review by insurance although I may not be ready to order this yet 3. Still using Hydrofera Blue. 4. Likely to require mechanical debridement next week Electronic Signature(s) Signed: 06/16/2021 5:13:41 PM By: Baltazar Najjar MD Entered By: Baltazar Najjar on 06/16/2021 15:20:38 -------------------------------------------------------------------------------- SuperBill Details Patient Name: Date of Service: JACILYN, SANPEDRO 06/16/2021 Medical Record Number: [id number] Patient Account Number: [id number] Date of Birth/Sex: Treating RN: 1941-05-27 (80 y.o. Pam [name] Primary Care Provider: [name] Other Clinician: Referring Provider: Treating Provider/Extender: Angus Palms Weeks in Treatment: 4 Diagnosis Coding ICD-10 Codes Code Description [phone] Chronic venous hypertension (idiopathic) with ulcer and inflammation of left lower extremity L97.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 of skin Facility Procedures CPT4 Code: [id number] Description: (Facility Use Only) [id number] - APPLY MULTLAY COMPRS LWR LT  LEG Modifier: Quantity: 1 Physician Procedures Electronic Signature(s) Signed: 06/16/2021 7:11:13 PM By: Zandra Abts RN, BSN Signed: 06/17/2021 8:35:52 PM By: Baltazar Najjar MD Previous Signature: 06/16/2021 5:13:41 PM Version By: Baltazar Najjar MD Entered By: Zandra Abts on 06/16/2021 18:31:54

## 2021-06-23 ENCOUNTER — Other Ambulatory Visit: Payer: Self-pay

## 2021-06-23 ENCOUNTER — Other Ambulatory Visit (HOSPITAL_COMMUNITY): Payer: Self-pay | Admitting: Internal Medicine

## 2021-06-23 ENCOUNTER — Encounter (HOSPITAL_BASED_OUTPATIENT_CLINIC_OR_DEPARTMENT_OTHER): Payer: Medicare Other | Admitting: Internal Medicine

## 2021-06-23 DIAGNOSIS — I87332 Chronic venous hypertension (idiopathic) with ulcer and inflammation of left lower extremity: Secondary | ICD-10-CM | POA: Diagnosis not present

## 2021-06-23 DIAGNOSIS — L97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 of skin: Secondary | ICD-10-CM

## 2021-06-24 NOTE — Progress Notes (Signed)
Pam [name], Pam [name] ([id number]) Visit Report for 06/23/2021 HPI Details Patient Name: Date of Service: Pam [name], Pam [name] 06/23/2021 2:30 PM Medical Record Number: [id number] Patient Account Number: [id number] Date of Birth/Sex: Treating RN: [birth year] (80 y.o. Pam [name] Primary Care Provider: Deatra [name] Other Clinician: Referring Provider: Treating Provider/Extender: Pam [name] in Treatment: 5 History of Present Illness HPI Description: ADMISSION 05/14/2021 with This is a 80 year old reasonably healthy woman who is still working at [employer] and about to retire at the end of the month. She has had a problem with lower extremity edema for about the last 6 months she tells me. 2 months ago she noted small bumps on both of her legs however the left one medially morphed into a large blister. She was seen by her primary physician Dr. Wynelle [name] on 04/19/2021. Put on doxycycline. He noted at that time that the wound had been weeping for the last 5 [name]. The patient does not have a wound history. 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 We used Iodoflex under compression last week. 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 We have been using Iodoflex to clean up the wound surface. She came in with drainage. 6/24; wound on the left medial lower leg. Using Iodoflex to clean up the wound surface. We are making gradual progress with that. Nursing reported an odor and drainage. 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 Mentioning this because  I would like to see about Apligraf 6/29; left medial lower leg using Iodoflex to date. Making gradual improvement with a nonviable surface. 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 Close to ordering Apligraf through her insuranceo Next week 7/6; still have not heard about Apligraf through her insurance. She is making gradual improvement in the condition of this wound bed. She is retired and is now under [employer]. We have been using Hydrofera Blue under compression This is a fairly large wound. I have suspected that this was a venous insufficiency ulcer. As it is not really improving with standard therapy I have elected to go ahead and order venous reflux studies today 7/13; she is approved for Apligraf we will order that for next week. She continues to make decent improvements slightly smaller today surface looks better we have been using Hydrofera Blue under compression. She has her venous reflux studies next Electronic Signature(s) Signed: 06/23/2021 5:03:28 PM By: Pam Najjar MD Entered By: Pam [name] on 06/23/2021 15:11:51 -------------------------------------------------------------------------------- Physical Exam Details Patient Name: Date of Service: Pam [name] 06/23/2021 2:30 PM Medical Record Number: [id number] Patient Account Number: [id number] Date of Birth/Sex: Treating RN: [birth year] (80 y.o. Pam [name] Primary Care Provider: Deatra [name] Other Clinician: Referring Provider: Treating Provider/Extender: Pam [name] in Treatment: 5 Constitutional Patient is hypertensive.. Pulse regular and within target range for patient.Marland Kitchen Respirations regular, non-labored and within target range.. Temperature is normal and within the target range for the patient.Marland Kitchen Appears in no distress. Notes Wound exam left medial lower leg under illumination the surface looks fairly decent she  has rims of epithelialization. Surface area is  measuring better. No evidence of surrounding infection pedal pulses are palpable. We have good edema control Electronic Signature(s) Signed: 06/23/2021 5:03:28 PM By: Pam Najjar MD Entered By: Pam [name] on 06/23/2021 15:13:29 -------------------------------------------------------------------------------- Physician Orders Details Patient Name: Date of Service: Pam [name], Pam [name] 06/23/2021 2:30 PM Medical Record Number: [id number] Patient Account Number: [id number] Date of Birth/Sex: Treating RN: 20-Aug-1941 (80 y.o. Pam [name] Primary Care Provider: Deatra [name] Other Clinician: Referring Provider: Treating Provider/Extender: Pam [name] in Treatment: 5 Verbal / Phone Orders: No Diagnosis Coding ICD-10 Coding Code Description [phone] Chronic venous hypertension (idiopathic) with ulcer and inflammation of left lower extremity L97.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 of skin Follow-up Appointments ppointment in 1 week. - with Dr. Leanord [name] Return A Bathing/ Shower/ Hygiene May shower with protection but do not get wound dressing(s) wet. Edema Control - Lymphedema / SCD / Other Elevate legs to the level of the heart or above for 30 minutes daily and/or when sitting, a frequency of: - throughout the day Avoid standing for long periods of time. Exercise regularly Additional Orders / Instructions Follow Nutritious Diet Wound Treatment Wound #1 - Lower Leg Wound Laterality: Left, Medial Cleanser: Soap and Water 1 x Per Week/30 Days Discharge Instructions: May shower and wash wound with dial antibacterial soap and water prior to dressing change. Cleanser: Wound Cleanser 1 x Per Week/30 Days Discharge Instructions: Cleanse the wound with wound cleanser prior to applying a clean dressing using gauze sponges, not tissue or cotton balls. Peri-Wound Care: Triamcinolone 15 (g) 1 x Per Week/30 Days Discharge  Instructions: to periwouind mixed with lotion Peri-Wound Care: Sween Lotion (Moisturizing lotion) 1 x Per Week/30 Days Discharge Instructions: Apply to periwound Prim Dressing: Hydrofera Blue Classic Foam, 4x4 in 1 x Per Week/30 Days ary Discharge Instructions: Moisten with saline prior to applying to wound bed Secondary Dressing: Woven Gauze Sponge, Non-Sterile 4x4 in 1 x Per Week/30 Days Discharge Instructions: Apply over primary dressing as directed. Secondary Dressing: ABD Pad, 8x10 1 x Per Week/30 Days Discharge Instructions: Apply over primary dressing as directed. Compression Wrap: FourPress (4 layer compression wrap) 1 x Per Week/30 Days Discharge Instructions: Apply four layer compression as directed. May also use Miliken CoFlex 2 layer compression system as alternative. Electronic Signature(s) Signed: 06/23/2021 5:03:28 PM By: Pam Najjar MD Signed: 06/24/2021 6:01:10 PM By: Zandra Abts RN, BSN Entered By: Zandra Abts on 06/23/2021 15:09:20 -------------------------------------------------------------------------------- Problem List Details Patient Name: Date of Service: Pam [name], Pam [name] 06/23/2021 2:30 PM Medical Record Number: [id number] Patient Account Number: [id number] Date of Birth/Sex: Treating RN: Jun 14, 1941 (80 y.o. Pam [name] Primary Care Provider: Deatra [name] Other Clinician: Referring Provider: Treating Provider/Extender: Pam [name] in Treatment: 5 Active Problems ICD-10 Encounter Code Description Active Date MDM Diagnosis I87.332 Chronic venous hypertension (idiopathic) with ulcer and inflammation of left 05/14/2021 No Yes lower extremity L97.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 05/14/2021 No Yes of skin Inactive Problems Resolved Problems Electronic Signature(s) Signed: 06/23/2021 5:03:28 PM By: Pam Najjar MD Entered By: Pam [name] on 06/23/2021  15:11:09 -------------------------------------------------------------------------------- Progress Note Details Patient Name: Date of Service: Pam, NEUROTH [name] 06/23/2021 2:30 PM Medical Record Number: [id number] Patient Account Number: [id number] Date of Birth/Sex: Treating RN: 09-10-1941 (80 y.o. Pam [name] Primary Care Provider: Deatra [name] Other Clinician: Referring Provider: Treating Provider/Extender: Peterson Ao, Charise Carwin Tania Ade  in Treatment: 5 Subjective History of Present Illness (HPI) ADMISSION 05/14/2021 with This is a 80 year old reasonably healthy woman who is still working at [employer] and about to retire at the end of the month. She has had a problem with lower extremity edema for about the last 6 months she tells me. 2 months ago she noted small bumps on both of her legs however the left one medially morphed into a large blister. She was seen by her primary physician Dr. Wynelle LinkSun on 04/19/2021. Put on doxycycline. He noted at that time that the wound had been weeping for the last 5 [name]. The patient does not have a wound history. 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 We used Iodoflex under compression last week. 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 We have been using Iodoflex to clean up the wound surface. She came in with drainage. 6/24; wound on the left medial lower leg. Using Iodoflex to clean up the wound surface. We are making gradual progress with that. Nursing reported an odor and drainage. She is still working and has commercial [employer] insurance but will transition to [employer]  at the end of July. Mentioning this because I would like to see about Apligraf 6/29; left medial lower leg using Iodoflex to date. Making gradual improvement with a nonviable surface. She Chartered certified accountantretires tomorrow. [facility]. Close to ordering Apligraf through her insuranceo Next week 7/6; still have not heard about Apligraf through her insurance. She is making gradual improvement in the condition of this wound bed. She is retired and is now under [employer]. We have been using Hydrofera Blue under compression This is a fairly large wound. I have suspected that this was a venous insufficiency ulcer. As it is not really improving with standard therapy I have elected to go ahead and order venous reflux studies today 7/13; she is approved for Apligraf we will order that for next week. She continues to make decent improvements slightly smaller today surface looks better we have been using Hydrofera Blue under compression. She has her venous reflux studies next Objective Constitutional Patient is hypertensive.. Pulse regular and within target range for patient.Marland Kitchen. Respirations regular, non-labored and within target range.. Temperature is normal and within the target range for the patient.Marland Kitchen. Appears in no distress. Vitals Time Taken: 2:58 PM, Height: 64 in, Weight: 180 lbs, BMI: 30.9, Temperature: 98.6 F, Pulse: 79 bpm, Respiratory Rate: 16 breaths/min, Blood Pressure: 152/83 mmHg. General Notes: Wound exam left medial lower leg under illumination the surface looks fairly decent she has rims of epithelialization. Surface area is measuring better. No evidence of surrounding infection pedal pulses are palpable. We have good edema control Integumentary (Hair, Skin) Wound #1 status is Open. Original cause of wound was Gradually Appeared. The date acquired was: 03/14/2021. The wound has been in treatment 5 [name]. The wound is located on the Left,Medial Lower Leg. The wound measures  3.2cm length x 4.2cm width x 0.1cm depth; 10.556cm^2 area and 1.056cm^3 volume. There is Fat Layer (Subcutaneous Tissue) exposed. There is no tunneling or undermining noted. There is a medium amount  of serosanguineous drainage noted. The wound margin is distinct with the outline attached to the wound base. There is large (67-100%) red, pink granulation within the wound bed. There is a small (1-33%) amount of necrotic tissue within the wound bed including Adherent Slough. 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 There was a Four Layer Compression Therapy Procedure by Zandra Abts, RN. Post procedure Diagnosis Wound #1: Same as Pre-Procedure Plan Follow-up Appointments: Return Appointment in 1 week. - with Dr. Chauncey Mann Shower/ Hygiene: May shower with protection but do not get wound dressing(s) wet. Edema Control - Lymphedema / SCD / Other: Elevate legs to the level of the heart or above for 30 minutes daily and/or when sitting, a frequency of: - throughout the day Avoid standing for long periods of time. Exercise regularly Additional Orders / Instructions: Follow Nutritious Diet WOUND #1: - Lower Leg Wound Laterality: Left, Medial Cleanser: Soap and Water 1 x Per Week/30 Days Discharge Instructions: May shower and wash wound with dial antibacterial soap and water prior to dressing change. Cleanser: Wound Cleanser 1 x Per Week/30 Days Discharge Instructions: Cleanse the wound with wound cleanser prior to applying a clean dressing using gauze sponges, not tissue or cotton balls. Peri-Wound Care: Triamcinolone 15 (g) 1 x Per Week/30 Days Discharge Instructions: to periwouind mixed with  lotion Peri-Wound Care: Sween Lotion (Moisturizing lotion) 1 x Per Week/30 Days Discharge Instructions: Apply to periwound Prim Dressing: Hydrofera Blue Classic Foam, 4x4 in 1 x Per Week/30 Days ary Discharge Instructions: Moisten with saline prior to applying to wound bed Secondary Dressing: Woven Gauze Sponge, Non-Sterile 4x4 in 1 x Per Week/30 Days Discharge Instructions: Apply over primary dressing as directed. Secondary Dressing: ABD Pad, 8x10 1 x Per Week/30 Days Discharge Instructions: Apply over primary dressing as directed. Com pression Wrap: FourPress (4 layer compression wrap) 1 x Per Week/30 Days Discharge Instructions: Apply four layer compression as directed. May also use Miliken CoFlex 2 layer compression system as alternative. 1. I am continuing with the Hydrofera Blue 2. Apligraf #1 ordered for next week 3. I will likely attempt debridement of this before I put the Apligraf on. More just to see the condition of the surface. This is somewhat dependent on the improvement in surface area Electronic Signature(s) Signed: 06/23/2021 5:03:28 PM By: Pam Najjar MD Entered By: Pam [name] on 06/23/2021 15:14:15 -------------------------------------------------------------------------------- SuperBill Details Patient Name: Date of Service: Pam [name], Pam [name] 06/23/2021 Medical Record Number: [id number] Patient Account Number: [id number] Date of Birth/Sex: Treating RN: 19-Jan-1941 (80 y.o. Pam [name] Primary Care Provider: Deatra [name] Other Clinician: Referring Provider: Treating Provider/Extender: Pam [name] in Treatment: 5 Diagnosis Coding ICD-10 Codes Code Description [phone] Chronic venous hypertension (idiopathic) with ulcer and inflammation of left lower extremity L97.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 of skin Facility Procedures CPT4 Code: [id number] Description: (Facility Use Only) [phone]  - APPLY MULTLAY COMPRS LWR LT LEG Modifier: Quantity: 1 Physician Procedures : CPT4 Code Description Modifier [id number] - WC PHYS LEVEL 3 - EST PT ICD-10 Diagnosis Description I87.332 Chronic venous hypertension (idiopathic) with ulcer and inflammation of left lower extremity L97.821 Non-pressure chronic ulcer of other part  of left lower leg  limited to breakdown of skin Quantity: 1 Electronic Signature(s) Signed: 06/24/2021 4:10:45 PM By: Pam Najjar MD Signed: 06/24/2021 6:01:10 PM By: Zandra Abts RN, BSN Previous Signature: 06/23/2021 5:03:28 PM Version By: Pam Najjar MD Entered By: Zandra Abts on 06/23/2021 17:49:59

## 2021-06-25 ENCOUNTER — Ambulatory Visit (HOSPITAL_COMMUNITY)
Admission: RE | Admit: 2021-06-25 | Discharge: 2021-06-25 | Disposition: A | Discharge status: Home / Self Care | Payer: Medicare Other | Source: Ambulatory Visit | Attending: Internal Medicine | Admitting: Internal Medicine

## 2021-06-25 ENCOUNTER — Other Ambulatory Visit: Payer: Self-pay

## 2021-06-25 DIAGNOSIS — L97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 of skin: Secondary | ICD-10-CM | POA: Diagnosis present

## 2021-06-28 NOTE — Progress Notes (Signed)
YANELIE, ABRAHA ([id number]) Visit Report for 06/23/2021 Arrival Information Details Patient Name: Date of Service: VONNA, BRABSON 06/23/2021 2:30 PM Medical Record Number: [id number] Patient Account Number: [id number] Date of Birth/Sex: Treating RN: 11/16/1941 (80 y.o. [name] Primary Care Margurite Duffy: Donald Prose Other Clinician: Referring Akiem Urieta: Treating Shaney Deckman/Extender: Charyl Dancer Weeks in Treatment: 5 Visit Information History Since Last Visit Added or deleted any medications: No Patient Arrived: Ambulatory Any new allergies or adverse reactions: No Arrival Time: 14:58 Had a fall or experienced change in No Accompanied By: alone activities of daily living that may affect Transfer Assistance: None risk of falls: Patient Identification Verified: Yes Signs or symptoms of abuse/neglect since last visito No Secondary Verification Process Completed: Yes Hospitalized since last visit: No Patient Requires Transmission-Based Precautions: No Implantable device outside of the clinic excluding No Patient Has Alerts: No cellular tissue based products placed in the center since last visit: Has Dressing in Place as Prescribed: Yes Has Compression in Place as Prescribed: Yes Pain Present Now: No Electronic Signature(s) Signed: 06/24/2021 6:01:10 PM By: Levan Hurst RN, BSN Entered By: Levan Hurst on 06/23/2021 14:58:51 -------------------------------------------------------------------------------- Compression Therapy Details Patient Name: Date of Service: SRITHA, CHAUNCEY NELL 06/23/2021 2:30 PM Medical Record Number: [id number] Patient Account Number: [id number] Date of Birth/Sex: Treating RN: 1941-04-13 (79 y.o. [name] Primary Care Mairead Schwarzkopf: Donald Prose Other Clinician: Referring Laquita Harlan: Treating Lindsey Hommel/Extender: Charyl Dancer Weeks in Treatment: 5 Compression Therapy Performed for Wound Assessment: Wound #1 Left,Medial Lower  Leg Performed By: Clinician Levan Hurst, RN Compression Type: Four Layer Post Procedure Diagnosis Same as Pre-procedure Electronic Signature(s) Signed: 06/24/2021 6:01:10 PM By: Levan Hurst RN, BSN Entered By: Levan Hurst on 06/23/2021 15:10:08 -------------------------------------------------------------------------------- Encounter Discharge Information Details Patient Name: Date of Service: ANNALIZA, ZIA NELL 06/23/2021 2:30 PM Medical Record Number: [id number] Patient Account Number: [id number] Date of Birth/Sex: Treating RN: 10-02-1941 (80 y.o. [name] Primary Care Khalia Gong: Donald Prose Other Clinician: Referring Ravneet Spilker: Treating Xiong Haidar/Extender: Charyl Dancer Weeks in Treatment: 5 Encounter Discharge Information Items Discharge Condition: Stable Ambulatory Status: Ambulatory Discharge Destination: Home Transportation: Private Auto Accompanied By: alone Schedule Follow-up Appointment: Yes Clinical Summary of Care: Patient Declined Electronic Signature(s) Signed: 06/24/2021 6:01:10 PM By: Levan Hurst RN, BSN Entered By: Levan Hurst on 06/23/2021 17:51:40 -------------------------------------------------------------------------------- Lower Extremity Assessment Details Patient Name: Date of Service: CARINA, CHAPLIN 06/23/2021 2:30 PM Medical Record Number: [id number] Patient Account Number: [id number] Date of Birth/Sex: Treating RN: February 19, 1941 (80 y.o. [name] Primary Care Eder Macek: Donald Prose Other Clinician: Referring Wayde Gopaul: Treating Avryl Roehm/Extender: Charyl Dancer Weeks in Treatment: 5 Edema Assessment Assessed: [Left: No] [Right: No] Edema: [Left: N] [Right: o] Calf Left: Right: Point of Measurement: 35 cm From Medial Instep 31.5 cm Ankle Left: Right: Point of Measurement: 9 cm From Medial Instep 22 cm Vascular Assessment Pulses: Dorsalis Pedis Palpable: [Left:Yes] Electronic  Signature(s) Signed: 06/24/2021 6:01:10 PM By: Levan Hurst RN, BSN Entered By: Levan Hurst on 06/23/2021 15:05:46 -------------------------------------------------------------------------------- Multi Wound Chart Details Patient Name: Date of Service: KALEB, LINQUIST NELL 06/23/2021 2:30 PM Medical Record Number: [id number] Patient Account Number: [id number] Date of Birth/Sex: Treating RN: October 17, 1941 (80 y.o. [name] Primary Care Cailynn Bodnar: Donald Prose Other Clinician: Referring Bernadett Milian: Treating Senta Kantor/Extender: Charyl Dancer Weeks in Treatment: 5 Vital Signs Height(in): 64 Pulse(bpm): 79 Weight(lbs): 180 Blood Pressure(mmHg): 152/83 Body Mass Index(BMI): 31 Temperature(F): 98.6 Respiratory Rate(breaths/min): 16 Photos: [1:No Photos Left, Medial Lower Leg] [N/A:N/A N/A] Wound Location: [1:Gradually Appeared] [  N/A:N/A] Wounding Event: [1:Venous Leg Ulcer] [N/A:N/A] Primary Etiology: [1:Cataracts, Glaucoma, Hypertension, N/A] Comorbid History: [1:Osteoarthritis 03/14/2021] [N/A:N/A] Date Acquired: [1:5] [N/A:N/A] Weeks of Treatment: [1:Open] [N/A:N/A] Wound Status: [1:3.2x4.2x0.1] [N/A:N/A] Measurements L x W x D (cm) [1:10.556] [N/A:N/A] A (cm) : rea [1:1.056] [N/A:N/A] Volume (cm) : [1:77.00%] [N/A:N/A] % Reduction in Area: [1:77.00%] [N/A:N/A] % Reduction in Volume: [1:Full Thickness With Exposed Support N/A] Classification: [1:Structures Medium] [N/A:N/A] Exudate Amount: [1:Serosanguineous] [N/A:N/A] Exudate Type: [1:red, brown] [N/A:N/A] Exudate Color: [1:Distinct, outline attached] [N/A:N/A] Wound Margin: [1:Large (67-100%)] [N/A:N/A] Granulation Amount: [1:Red, Pink] [N/A:N/A] Granulation Quality: [1:Small (1-33%)] [N/A:N/A] Necrotic Amount: [1:Fat Layer (Subcutaneous Tissue): Yes N/A] Exposed Structures: [1:Fascia: No Tendon: No Muscle: No Joint: No Bone: No Small (1-33%)] [N/A:N/A] Epithelialization: [1:Compression Therapy]  [N/A:N/A] Treatment Notes Electronic Signature(s) Signed: 06/23/2021 5:03:28 PM By: Linton Ham MD Signed: 06/24/2021 6:01:10 PM By: Levan Hurst RN, BSN Entered By: Linton Ham on 06/23/2021 15:11:17 -------------------------------------------------------------------------------- Multi-Disciplinary Care Plan Details Patient Name: Date of Service: FABLE, HUISMAN NELL 06/23/2021 2:30 PM Medical Record Number: [id number] Patient Account Number: [id number] Date of Birth/Sex: Treating RN: July 16, 1941 (80 y.o. [name] Primary Care Sharrie Self: Donald Prose Other Clinician: Referring Cari Burgo: Treating Carolynn Tuley/Extender: Charyl Dancer Weeks in Treatment: 5 Active Inactive Venous Leg Ulcer Nursing Diagnoses: Actual venous Insuffiency (use after diagnosis is confirmed) Goals: Patient will maintain optimal edema control Date Initiated: 05/14/2021 Target Resolution Date: 07/16/2021 Goal Status: Active Patient/caregiver will verbalize understanding of disease process and disease management Date Initiated: 05/14/2021 Date Inactivated: 06/09/2021 Target Resolution Date: 06/11/2021 Goal Status: Met Interventions: Assess peripheral edema status every visit. Compression as ordered Treatment Activities: Therapeutic compression applied : 05/14/2021 Notes: Wound/Skin Impairment Nursing Diagnoses: Impaired tissue integrity Goals: Patient/caregiver will verbalize understanding of skin care regimen Date Initiated: 05/14/2021 Target Resolution Date: 07/16/2021 Goal Status: Active Ulcer/skin breakdown will have a volume reduction of 30% by week 4 Date Initiated: 05/14/2021 Date Inactivated: 06/16/2021 Target Resolution Date: 06/18/2021 Goal Status: Met Interventions: Assess patient/caregiver ability to obtain necessary supplies Assess patient/caregiver ability to perform ulcer/skin care regimen upon admission and as needed Assess ulceration(s) every visit Provide education on ulcer  and skin care Treatment Activities: Skin care regimen initiated : 05/14/2021 Topical wound management initiated : 05/14/2021 Notes: Electronic Signature(s) Signed: 06/24/2021 6:01:10 PM By: Levan Hurst RN, BSN Entered By: Levan Hurst on 06/23/2021 17:48:46 -------------------------------------------------------------------------------- Pain Assessment Details Patient Name: Date of Service: JAZMAINE, FUELLING NELL 06/23/2021 2:30 PM Medical Record Number: [id number] Patient Account Number: [id number] Date of Birth/Sex: Treating RN: October 15, 1941 (80 y.o. [name] Primary Care Victoriana Aziz: Donald Prose Other Clinician: Referring Davion Meara: Treating Merdith Adan/Extender: Charyl Dancer Weeks in Treatment: 5 Active Problems Location of Pain Severity and Description of Pain Patient Has Paino No Site Locations Pain Management and Medication Current Pain Management: Electronic Signature(s) Signed: 06/24/2021 6:01:10 PM By: Levan Hurst RN, BSN Entered By: Levan Hurst on 06/23/2021 15:05:35 -------------------------------------------------------------------------------- Patient/Caregiver Education Details Patient Name: Date of Service: JAMEEKA, MARCY 7/13/2022andnbsp2:30 PM Medical Record Number: [id number] Patient Account Number: [id number] Date of Birth/Gender: Treating RN: [birth year] (80 y.o. [name] Primary Care Physician: Donald Prose Other Clinician: Referring Physician: Treating Physician/Extender: Gwendalyn Ege in Treatment: 5 Education Assessment Education Provided To: Patient Education Topics Provided Wound/Skin Impairment: Methods: Explain/Verbal Responses: State content correctly Electronic Signature(s) Signed: 06/24/2021 6:01:10 PM By: Levan Hurst RN, BSN Entered By: Levan Hurst on 06/23/2021 17:48:58 -------------------------------------------------------------------------------- Wound Assessment Details Patient  Name: Date of Service: VANESS, JELINSKI NELL 06/23/2021 2:30 PM Medical Record  Number: [id number] Patient Account Number: [id number] Date of Birth/Sex: Treating RN: [birth year] (80 y.o. [name] Primary Care Kensli Bowley: Donald Prose Other Clinician: Referring Eli Adami: Treating Elizah Mierzwa/Extender: Charyl Dancer Weeks in Treatment: 5 Wound Status Wound Number: 1 Primary Etiology: Venous Leg Ulcer Wound Location: Left, Medial Lower Leg Wound Status: Open Wounding Event: Gradually Appeared Comorbid History: Cataracts, Glaucoma, Hypertension, Osteoarthritis Date Acquired: 03/14/2021 Weeks Of Treatment: 5 Clustered Wound: No Photos Wound Measurements Length: (cm) 3.2 Width: (cm) 4.2 Depth: (cm) 0.1 Area: (cm) 10.556 Volume: (cm) 1.056 % Reduction in Area: 77% % Reduction in Volume: 77% Epithelialization: Small (1-33%) Tunneling: No Undermining: No Wound Description Classification: Full Thickness With Exposed Support Structures Wound Margin: Distinct, outline attached Exudate Amount: Medium Exudate Type: Serosanguineous Exudate Color: red, brown Foul Odor After Cleansing: No Slough/Fibrino Yes Wound Bed Granulation Amount: Large (67-100%) Exposed Structure Granulation Quality: Red, Pink Fascia Exposed: No Necrotic Amount: Small (1-33%) Fat Layer (Subcutaneous Tissue) Exposed: Yes Necrotic Quality: Adherent Slough Tendon Exposed: No Muscle Exposed: No Joint Exposed: No Bone Exposed: No Treatment Notes Wound #1 (Lower Leg) Wound Laterality: Left, Medial Cleanser Soap and Water Discharge Instruction: May shower and wash wound with dial antibacterial soap and water prior to dressing change. Wound Cleanser Discharge Instruction: Cleanse the wound with wound cleanser prior to applying a clean dressing using gauze sponges, not tissue or cotton balls. Peri-Wound Care Triamcinolone 15 (g) Discharge Instruction: to periwouind mixed with lotion Sween Lotion  (Moisturizing lotion) Discharge Instruction: Apply to periwound Topical Primary Dressing Hydrofera Blue Classic Foam, 4x4 in Discharge Instruction: Moisten with saline prior to applying to wound bed Secondary Dressing Woven Gauze Sponge, Non-Sterile 4x4 in Discharge Instruction: Apply over primary dressing as directed. ABD Pad, 8x10 Discharge Instruction: Apply over primary dressing as directed. Secured With Compression Wrap FourPress (4 layer compression wrap) Discharge Instruction: Apply four layer compression as directed. May also use Miliken CoFlex 2 layer compression system as alternative. Compression Stockings Add-Ons Electronic Signature(s) Signed: 06/24/2021 6:01:10 PM By: Levan Hurst RN, BSN Signed: 06/28/2021 3:49:49 PM By: Sandre Kitty Entered By: Sandre Kitty on 06/24/2021 10:03:14 -------------------------------------------------------------------------------- [place] Details Patient Name: Date of Service: EADIE, REPETTO NELL 06/23/2021 2:30 PM Medical Record Number: [id number] Patient Account Number: [id number] Date of Birth/Sex: Treating RN: 11-07-1941 (80 y.o. [name] Primary Care Kihanna Kamiya: Donald Prose Other Clinician: Referring Shar Paez: Treating Sotiria Keast/Extender: Charyl Dancer Weeks in Treatment: 5 Vital Signs Time Taken: 14:58 Temperature (F): 98.6 Height (in): 64 Pulse (bpm): 79 Weight (lbs): 180 Respiratory Rate (breaths/min): 16 Body Mass Index (BMI): 30.9 Blood Pressure (mmHg): 152/83 Reference Range: 80 - 120 mg / dl Electronic Signature(s) Signed: 06/24/2021 6:01:10 PM By: Levan Hurst RN, BSN Entered By: Levan Hurst on 06/23/2021 14:59:09

## 2021-06-29 ENCOUNTER — Ambulatory Visit
Admission: RE | Admit: 2021-06-29 | Discharge: 2021-06-29 | Disposition: A | Discharge status: Home / Self Care | Payer: BC Managed Care – PPO | Source: Ambulatory Visit | Attending: Family Medicine | Admitting: Family Medicine

## 2021-06-29 ENCOUNTER — Other Ambulatory Visit: Payer: Self-pay

## 2021-06-29 DIAGNOSIS — E2839 Other primary ovarian failure: Secondary | ICD-10-CM

## 2021-06-30 ENCOUNTER — Encounter (HOSPITAL_BASED_OUTPATIENT_CLINIC_OR_DEPARTMENT_OTHER): Payer: Medicare Other | Admitting: Internal Medicine

## 2021-06-30 DIAGNOSIS — I87332 Chronic venous hypertension (idiopathic) with ulcer and inflammation of left lower extremity: Secondary | ICD-10-CM | POA: Diagnosis not present

## 2021-07-05 NOTE — Progress Notes (Signed)
NEFTALY, INZUNZA ([id number]) Visit Report for 06/30/2021 Cellular or Tissue Based Product Details Patient Name: Date of Service: Pam [name], Pam [name] 06/30/2021 2:30 PM Medical Record Number: [id number] Patient Account Number: [id number] Date of Birth/Sex: Treating RN: 1941/04/16 (80 y.o. Tommye Standard Primary Care Provider: [name] Other Clinician: Referring Provider: Treating Provider/Extender: Angus Palms Weeks in Treatment: 6 Cellular or Tissue Based Product Type Wound #1 Left,[name], [name] Level of Consciousness (Pre-procedure): Awake and Alert Pre-procedure Verification/Time Out Yes - 15:40 Taken: Location: trunk / arms / legs Wound Size (sq cm): 10.15 Product Size (sq cm): 44 Waste Size (sq cm): 30 Waste Reason: wound size Amount of Product Applied (sq cm): 14 Lot #: [id number] Order #: 1 Expiration Date: 07/02/2021 Fenestrated: No Reconstituted: Yes Solution Type: normal saline Solution Amount: 6 ml Lot #: [id number] Solution Expiration Date: 07/02/2021 Secured: Yes Secured With: Steri-Strips Dressing Applied: Yes Primary Dressing: gauze, abd, compression wrap Procedural Pain: 0 Post Procedural Pain: 0 Response to Treatment: Procedure was tolerated well Level of Consciousness (Post- Awake and Alert procedure): Post Procedure Diagnosis Same as Pre-procedure Electronic Signature(s) Signed: 06/30/2021 4:58:30 PM By: Baltazar Najjar MD Entered By: Baltazar Najjar on 06/30/2021 15:49:51 -------------------------------------------------------------------------------- HPI Details Patient Name: Date of Service: Pam [name] 06/30/2021 2:30 PM Medical Record Number: [id number] Patient Account Number: [id number] Date of Birth/Sex: Treating RN: [birth year] (80 y.o. Tommye Standard Primary Care Provider: [name] Other  Clinician: Referring Provider: Treating Provider/Extender: Angus Palms Weeks in Treatment: 6 History of Present Illness HPI Description: ADMISSION 05/14/2021 with This is a 80 year old reasonably healthy woman who is still working at [employer] and about to retire at the end of the month. She has had a problem with lower extremity edema for about the last 6 months she tells me. 2 months ago she noted small bumps on both of her legs however the left one medially morphed into a large blister. She was seen by her primary physician Dr. [name] on 04/19/2021. Put on doxycycline. He noted at that time that the wound had been weeping for the last 5 weeks. The patient does not have a wound history. 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 We used Iodoflex under compression last week. 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 We have been using Iodoflex to clean up the wound surface. She came in with drainage. 6/24; wound on the left medial lower leg. Using Iodoflex to clean up the wound surface. We are making gradual progress with that. Nursing reported an odor and drainage. 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 Mentioning this because I would like to see about Apligraf 6/29; left medial lower leg using Iodoflex to date. Making gradual improvement with a nonviable surface. 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 Close to ordering Apligraf through her insuranceo Next week 7/6; still have not heard about Apligraf through  her insurance. She is making gradual improvement in the  condition of this wound bed. She is retired and is now under [employer]. We have been using Hydrofera Blue under compression This is a fairly large wound. I have suspected that this was a venous insufficiency ulcer. As it is not really improving with standard therapy I have elected to go ahead and order venous reflux studies today 7/13; she is approved for Apligraf we will order that for next week. She continues to make decent improvements slightly smaller today surface looks better we have been using Hydrofera Blue under compression. She has her venous reflux studies next 7/20; Apligraf #1 Electronic Signature(s) Signed: 06/30/2021 4:58:30 PM By: [name], Kamayah Pillay MD Entered By: [name], Holbert Caples on 06/30/2021 15:50:23 -------------------------------------------------------------------------------- Physical Exam Details Patient Name: Date of Service: Pam [name], Pam [name] 06/30/2021 2:30 PM Medical Record Number: [id number] Patient Account Number: [id number] Date of Birth/Sex: Treating RN: 13-Jun-1941 (80 y.o. Tommye StandardF) Boehlein, Linda Primary Care Provider: Deatra JamesSun, [name] Other Clinician: Referring Provider: Treating Provider/Extender: Angus Palmsobson, Jake Fuhrmann Sun, [name] Weeks in Treatment: 6 Constitutional Patient is hypertensive.. Pulse regular and within target range for patient.Marland Kitchen. Respirations regular, non-labored and within target range.. Temperature is normal and within the target range for the patient.Marland Kitchen. Appears in no distress. Notes Wound exam; left medial lower leg. Surface looks healthy. No debridement is required. Apligraf #1 applied in the standard fashion Electronic Signature(s) Signed: 06/30/2021 4:58:30 PM By: [name], Shirle Provencal MD Entered By: [name], Akira Adelsberger on 06/30/2021 15:52:09 -------------------------------------------------------------------------------- Physician Orders Details Patient Name: Date of Service: Pam [name], Pam [name] 06/30/2021 2:30  PM Medical Record Number: [id number] Patient Account Number: [id number] Date of Birth/Sex: Treating RN: 13-Jun-1941 (80 y.o. Wynelle LinkF) [name], [name] Primary Care Provider: Deatra JamesSun, [name] Other Clinician: Referring Provider: Treating Provider/Extender: Angus Palmsobson, Anaelle Dunton Sun, [name] Weeks in Treatment: 6 Verbal / Phone Orders: No Diagnosis Coding ICD-10 Coding Code Description [id number] Chronic venous hypertension (idiopathic) with ulcer and inflammation of left lower extremity L97.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 of skin Follow-up Appointments ppointment in 2 weeks. - with Leonard SchwartzHoyt Return A Nurse Visit: - 1 week for rewrap Cellular or Tissue Based Products Cellular or Tissue Based Product Type: - Apligraf #1 Bathing/ Shower/ Hygiene May shower with protection but do not get wound dressing(s) wet. Edema Control - Lymphedema / SCD / Other Elevate legs to the level of the heart or above for 30 minutes daily and/or when sitting, a frequency of: - throughout the day Avoid standing for long periods of time. Exercise regularly Additional Orders / Instructions Follow Nutritious Diet Wound Treatment Wound #1 - Lower Leg Wound Laterality: Left, Medial Cleanser: Soap and Water 1 x Per Week/30 Days Discharge Instructions: May shower and wash wound with dial antibacterial soap and water prior to dressing change. Cleanser: Wound Cleanser 1 x Per Week/30 Days Discharge Instructions: Cleanse the wound with wound cleanser prior to applying a clean dressing using gauze sponges, not tissue or cotton balls. Peri-Wound Care: Triamcinolone 15 (g) 1 x Per Week/30 Days Discharge Instructions: to periwouind mixed with lotion Peri-Wound Care: Sween Lotion (Moisturizing lotion) 1 x Per Week/30 Days Discharge Instructions: Apply to periwound Prim Dressing: Apligraf #1 ary 1 x Per Week/30 Days Secondary Dressing: Woven Gauze Sponge, Non-Sterile 4x4 in 1 x Per Week/30  Days Discharge Instructions: Apply over primary dressing as directed. Secondary Dressing: ABD Pad, 8x10 1 x Per Week/30 Days Discharge Instructions: Apply over primary dressing as directed. Compression Wrap: FourPress (4 layer compression wrap) 1 x Per Week/30 Days Discharge Instructions: Apply four layer compression as  directed. May also use Miliken CoFlex 2 layer compression system as alternative. Electronic Signature(s) Signed: 06/30/2021 4:58:30 PM By: Baltazar Najjar MD Signed: 07/05/2021 4:44:07 PM By: Zandra Abts RN, BSN Entered By: Zandra Abts on 06/30/2021 15:43:01 -------------------------------------------------------------------------------- Problem List Details Patient Name: Date of Service: Pam [name], Pam [name] 06/30/2021 2:30 PM Medical Record Number: [id number] Patient Account Number: [id number] Date of Birth/Sex: Treating RN: 10/22/1941 (80 y.o. [name] Primary Care Provider: [name] Other Clinician: Referring Provider: Treating Provider/Extender: Angus Palms Weeks in Treatment: 6 Active Problems ICD-10 Encounter Code Description Active Date MDM Diagnosis I87.332 Chronic venous hypertension (idiopathic) with ulcer and inflammation of left 05/14/2021 No Yes lower extremity L97.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 05/14/2021 No Yes of skin Inactive Problems Resolved Problems Electronic Signature(s) Signed: 06/30/2021 4:58:30 PM By: Baltazar Najjar MD Entered By: Baltazar Najjar on 06/30/2021 15:48:15 -------------------------------------------------------------------------------- Progress Note Details Patient Name: Date of Service: Pam [name], Pam [name] 06/30/2021 2:30 PM Medical Record Number: [id number] Patient Account Number: [id number] Date of Birth/Sex: Treating RN: 11/02/1941 (80 y.o. Tommye Standard Primary Care Provider: [name] Other Clinician: Referring Provider: Treating  Provider/Extender: Angus Palms Weeks in Treatment: 6 Subjective History of Present Illness (HPI) ADMISSION 05/14/2021 with This is a 80 year old reasonably healthy woman who is still working at [employer] and about to retire at the end of the month. She has had a problem with lower extremity edema for about the last 6 months she tells me. 2 months ago she noted small bumps on both of her legs however the left one medially morphed into a large blister. She was seen by her primary physician Dr. [name] on 04/19/2021. Put on doxycycline. He noted at that time that the wound had been weeping for the last 5 weeks. The patient does not have a wound history. 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 We used Iodoflex under compression last week. 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 We have been using Iodoflex to clean up the wound surface. She came in with drainage. 6/24; wound on the left medial lower leg. Using Iodoflex to clean up the wound surface. We are making gradual progress with that. Nursing reported an odor and drainage. 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 Mentioning this because I would like to see about Apligraf 6/29; left medial lower leg using Iodoflex to date. Making gradual improvement with a nonviable surface. 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 Close to ordering Apligraf through her insuranceo Next week 7/6; still have not heard about Apligraf through her insurance. She is making gradual  improvement in the condition of this wound bed. She is retired and is now under [employer]. We have been using Hydrofera Blue under compression This is a fairly large wound. I have suspected that this was a venous insufficiency ulcer. As it is not really improving with standard therapy I have elected to go ahead and order venous reflux studies today 7/13; she is approved for Apligraf  we will order that for next week. She continues to make decent improvements slightly smaller today surface looks better we have been using Hydrofera Blue under compression. She has her venous reflux studies next 7/20; Apligraf #1 Objective Constitutional Patient is hypertensive.. Pulse regular and within target range for patient.Marland Kitchen Respirations regular, non-labored and within target range.. Temperature is normal and within the target range for the patient.Marland Kitchen Appears in no distress. Vitals Time Taken: 2:56 PM, Height: 64 in, Weight: 180 lbs, BMI: 30.9, Temperature: 98.7 F, Pulse: 89 bpm, Respiratory Rate: 17 breaths/min, Blood Pressure: 150/74 mmHg. General Notes: Wound exam; left medial lower leg. Surface looks healthy. No debridement is required. Apligraf #1 applied in the standard fashion Integumentary (Hair, Skin) Wound #1 status is Open. Original cause of wound was Gradually Appeared. The date acquired was: 03/14/2021. The wound has been in treatment 6 weeks. The wound is located on the Left,Medial Lower Leg. The wound measures 2.9cm length x 3.5cm width x 0.1cm depth; 7.972cm^2 area and 0.797cm^3 volume. There is Fat Layer (Subcutaneous Tissue) exposed. There is no tunneling or undermining noted. There is a medium amount of serosanguineous drainage noted. The wound margin is distinct with the outline attached to the wound base. There is large (67-100%) red, pink granulation within the wound bed. There is a small (1- 33%) amount of necrotic tissue within the wound bed including Adherent Slough. 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 A skin graft procedure using a bioengineered skin substitute/cellular or tissue based product was performed by Maxwell Caul., MD with the following instrument(s): N/A. Apligraf was applied and secured with Steri-Strips. 14 sq cm of product was utilized and 30 sq cm was wasted due to wound size. Post Application, gauze, abd, compression wrap was applied. A Time Out was conducted at 15:40, prior to the start of the procedure. The procedure was tolerated well with a pain level of 0 throughout and a pain level of 0 following the procedure. Post procedure Diagnosis Wound #1: Same as Pre-Procedure . Pre-procedure diagnosis of Wound #1 is a Venous Leg Ulcer located on the Left,Medial Lower Leg . There was a Four Layer Compression Therapy Procedure by Zandra Abts, RN. Post procedure Diagnosis Wound #1: Same as Pre-Procedure Plan Follow-up Appointments: Return Appointment in 2 weeks. - with Leonard Schwartz Nurse Visit: - 1 week for rewrap Cellular or Tissue Based Products: Cellular or Tissue Based Product Type: - Apligraf #1 Bathing/ Shower/ Hygiene: May shower with protection but do not get wound dressing(s) wet. Edema Control - Lymphedema / SCD / Other: Elevate legs to the level of the heart or above for 30 minutes daily and/or when sitting, a frequency of: - throughout the day Avoid standing for long periods of time. Exercise regularly Additional Orders / Instructions: Follow Nutritious Diet WOUND #1: - Lower Leg Wound Laterality: Left, Medial Cleanser: Soap and Water 1 x Per Week/30 Days Discharge Instructions: May shower and wash wound with dial antibacterial soap and water prior to  dressing change. Cleanser: Wound Cleanser 1 x Per Week/30 Days Discharge Instructions: Cleanse the wound with wound cleanser prior to applying a clean dressing using gauze sponges, not tissue or cotton balls. Peri-Wound Care: Triamcinolone 15 (g) 1 x Per Week/30 Days Discharge Instructions: to  periwouind mixed with lotion Peri-Wound Care: Sween Lotion (Moisturizing lotion) 1 x Per Week/30 Days Discharge Instructions: Apply to periwound Prim Dressing: Apligraf #1 1 x Per Week/30 Days ary Secondary Dressing: Woven Gauze Sponge, Non-Sterile 4x4 in 1 x Per Week/30 Days Discharge Instructions: Apply over primary dressing as directed. Secondary Dressing: ABD Pad, 8x10 1 x Per Week/30 Days Discharge Instructions: Apply over primary dressing as directed. Com pression Wrap: FourPress (4 layer compression wrap) 1 x Per Week/30 Days Discharge Instructions: Apply four layer compression as directed. May also use Miliken CoFlex 2 layer compression system as alternative. 1. Apligraf #1 in the standard fashion ABD 4-layer compression Electronic Signature(s) Signed: 06/30/2021 4:58:30 PM By: Baltazar Najjar MD Entered By: Baltazar Najjar on 06/30/2021 15:53:02 -------------------------------------------------------------------------------- SuperBill Details Patient Name: Date of Service: Pam [name], Pam [name] 06/30/2021 Medical Record Number: [id number] Patient Account Number: [id number] Date of Birth/Sex: Treating RN: [birth year] (80 y.o. Tommye Standard Primary Care Provider: [name] Other Clinician: Referring Provider: Treating Provider/Extender: Angus Palms Weeks in Treatment: 6 Diagnosis Coding ICD-10 Codes Code Description [phone] Chronic venous hypertension (idiopathic) with ulcer and inflammation of left lower extremity L97.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 of skin Facility Procedures CPT4 Code: [id number] Description:  (Facility Use Only) Apligraf 1 SQ CM Modifier: Quantity: 44 CPT4 Code: [id number] Description: 15271 - SKIN SUB GRAFT TRNK/ARM/LEG ICD-10 Diagnosis Description I87.332 Chronic venous hypertension (idiopathic) with ulcer and inflammation of left lo L97.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 Modifier: wer extremity of skin Quantity: 1 Physician Procedures : CPT4 Code Description Modifier [id number] - WC PHYS SKIN SUB GRAFT TRNK/ARM/LEG ICD-10 Diagnosis Description I87.332 Chronic venous hypertension (idiopathic) with ulcer and inflammation of left lower extremity L97.821 Non-pressure chronic ulcer of  other part of left lower leg limited to breakdown of skin Quantity: 1 Electronic Signature(s) Signed: 06/30/2021 4:58:30 PM By: Baltazar Najjar MD Entered By: Baltazar Najjar on 06/30/2021 15:53:19

## 2021-07-05 NOTE — Progress Notes (Signed)
ANALAYA, HOEY ([id number]) Visit Report for 06/30/2021 Arrival Information Details Patient Name: Date of Service: CLARISSIA, MCKEEN 06/30/2021 2:30 PM Medical Record Number: [id number] Patient Account Number: [id number] Date of Birth/Sex: Treating RN: [birth year] (80 y.o. [name], Lauren Primary Care Liliann File: Donald Prose Other Clinician: Referring Melford Tullier: Treating Minervia Osso/Extender: Charyl Dancer Weeks in Treatment: 6 Visit Information History Since Last Visit Added or deleted any medications: No Patient Arrived: Ambulatory Any new allergies or adverse reactions: No Arrival Time: 14:56 Had a fall or experienced change in No Accompanied By: self activities of daily living that may affect Transfer Assistance: None risk of falls: Patient Identification Verified: Yes Signs or symptoms of abuse/neglect since last visito No Secondary Verification Process Completed: Yes Hospitalized since last visit: No Patient Requires Transmission-Based Precautions: No Implantable device outside of the clinic excluding No Patient Has Alerts: No cellular tissue based products placed in the center since last visit: Has Dressing in Place as Prescribed: Yes Pain Present Now: No Electronic Signature(s) Signed: 07/01/2021 7:48:44 PM By: Rhae Hammock RN Entered By: Rhae Hammock on 06/30/2021 14:56:26 -------------------------------------------------------------------------------- Compression Therapy Details Patient Name: Date of Service: LAURELL, COALSON 06/30/2021 2:30 PM Medical Record Number: [id number] Patient Account Number: [id number] Date of Birth/Sex: Treating RN: 28-Aug-1941 (80 y.o. [name] Primary Care Keanthony Poole: Donald Prose Other Clinician: Referring Ayleah Hofmeister: Treating Saniyya Gau/Extender: Charyl Dancer Weeks in Treatment: 6 Compression Therapy Performed for Wound Assessment: Wound #1 Left,Medial Lower Leg Performed By: Clinician Levan Hurst, RN Compression Type: Four Layer Post Procedure Diagnosis Same as Pre-procedure Electronic Signature(s) Signed: 07/05/2021 4:44:07 PM By: Levan Hurst RN, BSN Entered By: Levan Hurst on 06/30/2021 15:47:18 -------------------------------------------------------------------------------- Encounter Discharge Information Details Patient Name: Date of Service: COLLIE, KITTEL NELL 06/30/2021 2:30 PM Medical Record Number: [id number] Patient Account Number: [id number] Date of Birth/Sex: Treating RN: 06-22-1941 (80 y.o. [name], Lauren Primary Care Caden Fukushima: Donald Prose Other Clinician: Referring Charmelle Soh: Treating Deegan Valentino/Extender: Charyl Dancer Weeks in Treatment: 6 Encounter Discharge Information Items Post Procedure Vitals Discharge Condition: Stable Temperature (F): 98.7 Ambulatory Status: Ambulatory Pulse (bpm): 74 Discharge Destination: Home Respiratory Rate (breaths/min): 17 Transportation: Private Auto Blood Pressure (mmHg): 134/73 Accompanied By: self Schedule Follow-up Appointment: Yes Clinical Summary of Care: Patient Declined Electronic Signature(s) Signed: 07/01/2021 7:48:44 PM By: Rhae Hammock RN Entered By: Rhae Hammock on 06/30/2021 16:49:10 -------------------------------------------------------------------------------- Lower Extremity Assessment Details Patient Name: Date of Service: CRAIG, WISNEWSKI 06/30/2021 2:30 PM Medical Record Number: [id number] Patient Account Number: [id number] Date of Birth/Sex: Treating RN: [birth year] (80 y.o. [name], Lauren Primary Care Viola Kinnick: Donald Prose Other Clinician: Referring Amarys Sliwinski: Treating Aireana Ryland/Extender: Charyl Dancer Weeks in Treatment: 6 Edema Assessment Assessed: Shirlyn Goltz: Yes] [Right: No] Edema: [Left: Ye] [Right: s] Calf Left: Right: Point of Measurement: 35 cm From Medial Instep 31 cm Ankle Left: Right: Point of Measurement: 9 cm From Medial Instep  21 cm Vascular Assessment Pulses: Dorsalis Pedis Palpable: [Left:Yes] Posterior Tibial Palpable: [Left:Yes] Electronic Signature(s) Signed: 07/01/2021 7:48:44 PM By: Rhae Hammock RN Entered By: Rhae Hammock on 06/30/2021 15:07:07 -------------------------------------------------------------------------------- Multi Wound Chart Details Patient Name: Date of Service: JOSEFINE, FUHR NELL 06/30/2021 2:30 PM Medical Record Number: [id number] Patient Account Number: [id number] Date of Birth/Sex: Treating RN: 1941/08/09 (80 y.o. Elam Dutch Primary Care Tenisha Fleece: Donald Prose Other Clinician: Referring Sayler Mickiewicz: Treating Frederico Gerling/Extender: Charyl Dancer Weeks in Treatment: 6 Vital Signs Height(in): 64 Pulse(bpm): 89 Weight(lbs): 180 Blood Pressure(mmHg): 150/74 Body Mass Index(BMI): 31 Temperature(F): 98.7 Respiratory Rate(breaths/min): 17 Photos: [1:No  Photos Left, Medial Lower Leg] [N/A:N/A N/A] Wound Location: [1:Gradually Appeared] [N/A:N/A] Wounding Event: [1:Venous Leg Ulcer] [N/A:N/A] Primary Etiology: [1:Cataracts, Glaucoma, Hypertension, N/A] Comorbid History: [1:Osteoarthritis 03/14/2021] [N/A:N/A] Date Acquired: [1:6] [N/A:N/A] Weeks of Treatment: [1:Open] [N/A:N/A] Wound Status: [1:2.9x3.5x0.1] [N/A:N/A] Measurements L x W x D (cm) [1:7.972] [N/A:N/A] A (cm) : rea [1:0.797] [N/A:N/A] Volume (cm) : [1:82.60%] [N/A:N/A] % Reduction in Area: [1:82.70%] [N/A:N/A] % Reduction in Volume: [1:Full Thickness With Exposed Support N/A] Classification: [1:Structures Medium] [N/A:N/A] Exudate Amount: [1:Serosanguineous] [N/A:N/A] Exudate Type: [1:red, brown] [N/A:N/A] Exudate Color: [1:Distinct, outline attached] [N/A:N/A] Wound Margin: [1:Large (67-100%)] [N/A:N/A] Granulation Amount: [1:Red, Pink] [N/A:N/A] Granulation Quality: [1:Small (1-33%)] [N/A:N/A] Necrotic Amount: [1:Fat Layer (Subcutaneous Tissue): Yes N/A] Exposed  Structures: [1:Fascia: No Tendon: No Muscle: No Joint: No Bone: No Small (1-33%)] [N/A:N/A] Epithelialization: [1:Cellular or Tissue Based Product] [N/A:N/A] Procedures Performed: [1:Compression Therapy] Treatment Notes Electronic Signature(s) Signed: 06/30/2021 4:58:30 PM By: Linton Ham MD Signed: 06/30/2021 6:23:27 PM By: Baruch Gouty RN, BSN Entered By: Linton Ham on 06/30/2021 15:49:20 -------------------------------------------------------------------------------- Multi-Disciplinary Care Plan Details Patient Name: Date of Service: CALIANNA, KIM NELL 06/30/2021 2:30 PM Medical Record Number: [id number] Patient Account Number: [id number] Date of Birth/Sex: Treating RN: 07/10/1941 (80 y.o. [name] Primary Care Uzma Hellmer: Donald Prose Other Clinician: Referring Kaisei Gilbo: Treating Levern Kalka/Extender: Charyl Dancer Weeks in Treatment: 6 Active Inactive Venous Leg Ulcer Nursing Diagnoses: Actual venous Insuffiency (use after diagnosis is confirmed) Goals: Patient will maintain optimal edema control Date Initiated: 05/14/2021 Target Resolution Date: 07/16/2021 Goal Status: Active Patient/caregiver will verbalize understanding of disease process and disease management Date Initiated: 05/14/2021 Date Inactivated: 06/09/2021 Target Resolution Date: 06/11/2021 Goal Status: Met Interventions: Assess peripheral edema status every visit. Compression as ordered Treatment Activities: Therapeutic compression applied : 05/14/2021 Notes: Wound/Skin Impairment Nursing Diagnoses: Impaired tissue integrity Goals: Patient/caregiver will verbalize understanding of skin care regimen Date Initiated: 05/14/2021 Target Resolution Date: 07/16/2021 Goal Status: Active Ulcer/skin breakdown will have a volume reduction of 30% by week 4 Date Initiated: 05/14/2021 Date Inactivated: 06/16/2021 Target Resolution Date: 06/18/2021 Goal Status: Met Interventions: Assess patient/caregiver  ability to obtain necessary supplies Assess patient/caregiver ability to perform ulcer/skin care regimen upon admission and as needed Assess ulceration(s) every visit Provide education on ulcer and skin care Treatment Activities: Skin care regimen initiated : 05/14/2021 Topical wound management initiated : 05/14/2021 Notes: Electronic Signature(s) Signed: 07/05/2021 4:44:07 PM By: Levan Hurst RN, BSN Entered By: Levan Hurst on 06/30/2021 18:10:35 -------------------------------------------------------------------------------- Pain Assessment Details Patient Name: Date of Service: FINA, HEIZER 06/30/2021 2:30 PM Medical Record Number: [id number] Patient Account Number: [id number] Date of Birth/Sex: Treating RN: [birth year] (80 y.o. [name], Lauren Primary Care Gavynn Duvall: Donald Prose Other Clinician: Referring Jaquise Faux: Treating Zriyah Kopplin/Extender: Charyl Dancer Weeks in Treatment: 6 Active Problems Location of Pain Severity and Description of Pain Patient Has Paino No Site Locations Pain Management and Medication Current Pain Management: Electronic Signature(s) Signed: 07/01/2021 7:48:44 PM By: Rhae Hammock RN Entered By: Rhae Hammock on 06/30/2021 14:58:22 -------------------------------------------------------------------------------- Patient/Caregiver Education Details Patient Name: Date of Service: Gelene Mink 7/20/2022andnbsp2:30 PM Medical Record Number: [id number] Patient Account Number: [id number] Date of Birth/Gender: Treating RN: 1941-07-13 (80 y.o. [name] Primary Care Physician: Donald Prose Other Clinician: Referring Physician: Treating Physician/Extender: Gwendalyn Ege in Treatment: 6 Education Assessment Education Provided To: Patient Education Topics Provided Wound/Skin Impairment: Methods: Explain/Verbal Responses: State content correctly Electronic Signature(s) Signed: 07/05/2021 4:44:07  PM By: Levan Hurst RN, BSN Entered By: Levan Hurst on 06/30/2021 18:10:44 --------------------------------------------------------------------------------  Wound Assessment Details Patient Name: Date of Service: LASASHA, BROPHY 06/30/2021 2:30 PM Medical Record Number: [id number] Patient Account Number: [id number] Date of Birth/Sex: Treating RN: [birth year] (80 y.o. [name], Lauren Primary Care Randi [employer]: Donald Prose Other Clinician: Referring Dearia Wilmouth: Treating Eleanor Dimichele/Extender: Charyl Dancer Weeks in Treatment: 6 Wound Status Wound Number: 1 Primary Etiology: Venous Leg Ulcer Wound Location: Left, Medial Lower Leg Wound Status: Open Wounding Event: Gradually Appeared Comorbid History: Cataracts, Glaucoma, Hypertension, Osteoarthritis Date Acquired: 03/14/2021 Weeks Of Treatment: 6 Clustered Wound: No Photos Wound Measurements Length: (cm) 2.9 Width: (cm) 3.5 Depth: (cm) 0.1 Area: (cm) 7.972 Volume: (cm) 0.797 % Reduction in Area: 82.6% % Reduction in Volume: 82.7% Epithelialization: Small (1-33%) Tunneling: No Undermining: No Wound Description Classification: Full Thickness With Exposed Support Structures Wound Margin: Distinct, outline attached Exudate Amount: Medium Exudate Type: Serosanguineous Exudate Color: red, brown Foul Odor After Cleansing: No Slough/Fibrino Yes Wound Bed Granulation Amount: Large (67-100%) Exposed Structure Granulation Quality: Red, Pink Fascia Exposed: No Necrotic Amount: Small (1-33%) Fat Layer (Subcutaneous Tissue) Exposed: Yes Necrotic Quality: Adherent Slough Tendon Exposed: No Muscle Exposed: No Joint Exposed: No Bone Exposed: No Treatment Notes Wound #1 (Lower Leg) Wound Laterality: Left, Medial Cleanser Soap and Water Discharge Instruction: May shower and wash wound with dial antibacterial soap and water prior to dressing change. Wound Cleanser Discharge Instruction: Cleanse the wound with wound  cleanser prior to applying a clean dressing using gauze sponges, not tissue or cotton balls. Peri-Wound Care Triamcinolone 15 (g) Discharge Instruction: to periwouind mixed with lotion Sween Lotion (Moisturizing lotion) Discharge Instruction: Apply to periwound Topical Primary Dressing Apligraf #1 Secondary Dressing Woven Gauze Sponge, Non-Sterile 4x4 in Discharge Instruction: Apply over primary dressing as directed. ABD Pad, 8x10 Discharge Instruction: Apply over primary dressing as directed. Secured With Compression Wrap FourPress (4 layer compression wrap) Discharge Instruction: Apply four layer compression as directed. May also use Miliken CoFlex 2 layer compression system as alternative. Compression Stockings Add-Ons Electronic Signature(s) Signed: 07/01/2021 7:48:44 PM By: Rhae Hammock RN Signed: 07/02/2021 10:49:45 AM By: Sandre Kitty Entered By: Sandre Kitty on 06/30/2021 16:39:38 -------------------------------------------------------------------------------- Vitals Details Patient Name: Date of Service: ASHLEE, PLAYER 06/30/2021 2:30 PM Medical Record Number: [id number] Patient Account Number: [id number] Date of Birth/Sex: Treating RN: 09-30-1941 (80 y.o. [name], Lauren Primary Care Tyresha Fede: Donald Prose Other Clinician: Referring Mariko Nowakowski: Treating [place]/Extender: Charyl Dancer Weeks in Treatment: 6 Vital Signs Time Taken: 14:56 Temperature (F): 98.7 Height (in): 64 Pulse (bpm): 89 Weight (lbs): 180 Respiratory Rate (breaths/min): 17 Body Mass Index (BMI): 30.9 Blood Pressure (mmHg): 150/74 Reference Range: 80 - 120 mg / dl Electronic Signature(s) Signed: 07/01/2021 7:48:44 PM By: Rhae Hammock RN Entered By: Rhae Hammock on 06/30/2021 14:58:13

## 2021-07-07 ENCOUNTER — Encounter (HOSPITAL_BASED_OUTPATIENT_CLINIC_OR_DEPARTMENT_OTHER): Payer: Medicare Other | Admitting: Internal Medicine

## 2021-07-07 ENCOUNTER — Other Ambulatory Visit: Payer: Self-pay

## 2021-07-07 DIAGNOSIS — I87332 Chronic venous hypertension (idiopathic) with ulcer and inflammation of left lower extremity: Secondary | ICD-10-CM | POA: Diagnosis not present

## 2021-07-07 NOTE — Progress Notes (Signed)
Pam [name], Pam [name] ([id number]) Visit Report for 07/07/2021 Arrival Information Details Patient Name: Date of Service: Pam [name], Pam [name] 07/07/2021 2:30 PM Medical Record Number: [id number] Patient Account Number: [id number] Date of Birth/Sex: Treating RN: 08/11/1941 (80 y.o. Pam [name] Primary Care Pam [name]: Pam [name] Other Clinician: Referring Pam [name]: Treating Pam [name]/Extender: Pam [name] in Treatment: 7 Visit Information History Since Last Visit Added or deleted any medications: No Patient Arrived: Ambulatory Any new allergies or adverse reactions: No Arrival Time: 15:05 Had a fall or experienced change in No Transfer Assistance: None activities of daily living that may affect Patient Identification Verified: Yes risk of falls: Secondary Verification Process Completed: Yes Signs or symptoms of abuse/neglect since last visito No Patient Requires Transmission-Based Precautions: No Hospitalized since last visit: No Patient Has Alerts: No Implantable device outside of the clinic excluding No cellular tissue based products placed in the center since last visit: Has Dressing in Place as Prescribed: Yes Has Compression in Place as Prescribed: Yes Pain Present Now: No Electronic Signature(s) Signed: 07/07/2021 5:58:16 PM By: Pam [name] Entered By: Pam [name] on 07/07/2021 15:09:06 -------------------------------------------------------------------------------- Compression Therapy Details Patient Name: Date of Service: Pam [name] 07/07/2021 2:30 PM Medical Record Number: [id number] Patient Account Number: [id number] Date of Birth/Sex: Treating RN: 1941/03/16 (80 y.o. Pam [name] Primary Care Pam [name]: Pam [name] Other Clinician: Referring Pam [name]: Treating Pam [name]: Pam [name] in Treatment: 7 Compression Therapy Performed for Wound Assessment: Wound #1 Left,Medial Lower Leg Performed By: Clinician  Pam Iba, RN Compression Type: Double Layer Electronic Signature(s) Signed: 07/07/2021 5:58:16 PM By: Pam [name] Entered By: Pam [name] on 07/07/2021 15:10:03 -------------------------------------------------------------------------------- Encounter Discharge Information Details Patient Name: Date of Service: Pam [name], Pam [name] 07/07/2021 2:30 PM Medical Record Number: [id number] Patient Account Number: [id number] Date of Birth/Sex: Treating RN: 03-25-1941 (80 y.o. Pam [name] Primary Care Pam [name]: Pam [name] Other Clinician: Referring Pam [name]: Treating Pam [name]/Extender: Pam [name] in Treatment: 7 Encounter Discharge Information Items Discharge Condition: Stable Ambulatory Status: Ambulatory Discharge Destination: Home Transportation: Private Auto Schedule Follow-up Appointment: Yes Clinical Summary of Care: Provided on 07/07/2021 Form Type Recipient Paper Patient Patient Electronic Signature(s) Signed: 07/07/2021 5:58:16 PM By: Pam [name] Entered By: Pam [name] on 07/07/2021 15:24:43 -------------------------------------------------------------------------------- Patient/Caregiver Education Details Patient Name: Date of Service: Pam [name] 7/27/2022andnbsp2:30 PM Medical Record Number: [id number] Patient Account Number: [id number] Date of Birth/Gender: Treating RN: 02/04/1941 (80 y.o. Pam [name] Primary Care Physician: Pam [name] Other Clinician: Referring Physician: Treating Physician/Extender: Pam [name] in Treatment: 7 Education Assessment Education Provided To: Patient Education Topics Provided Venous: Methods: Explain/Verbal, Printed Responses: State content correctly Wound/Skin Impairment: Methods: Explain/Verbal, Printed Responses: State content correctly Electronic Signature(s) Signed: 07/07/2021 5:58:16 PM By: Pam [name] Entered By: Pam [name] on 07/07/2021  15:24:18 -------------------------------------------------------------------------------- Wound Assessment Details Patient Name: Date of Service: Pam [name], Pam [name] 07/07/2021 2:30 PM Medical Record Number: [id number] Patient Account Number: [id number] Date of Birth/Sex: Treating RN: 1941/09/09 (80 y.o. Pam [name] Primary Care Pam [name]: Pam [name] Other Clinician: Referring Pam [name]: Treating Pam [name]/Extender: Pam [name] in Treatment: 7 Wound Status Wound Number: 1 Primary Etiology: Venous Leg Ulcer Wound Location: Left, Medial Lower Leg Wound Status: Open Wounding Event: Gradually Appeared Comorbid History: Cataracts, Glaucoma, Hypertension, Osteoarthritis Date Acquired: 03/14/2021 [name] Of Treatment: 7 Clustered Wound: No Wound Measurements Length: (cm) 2.9 Width: (cm) 3.5 Depth: (cm) 0.1 Area: (cm) 7.972 Volume: (cm) 0.797 % Reduction in Area: 82.6% % Reduction in Volume: 82.7% Epithelialization: Small (  1-33%) Tunneling: No Undermining: No Wound Description Classification: Full Thickness With Exposed Support Structures Wound Margin: Distinct, outline attached Exudate Amount: Medium Exudate Type: Serosanguineous Exudate Color: red, brown Foul Odor After Cleansing: No Slough/Fibrino Yes Wound Bed Granulation Amount: Large (67-100%) Exposed Structure Granulation Quality: Red, Pink Fascia Exposed: No Necrotic Amount: Small (1-33%) Fat Layer (Subcutaneous Tissue) Exposed: Yes Necrotic Quality: Adherent Slough Tendon Exposed: No Muscle Exposed: No Joint Exposed: No Bone Exposed: No Treatment Notes Wound #1 (Lower Leg) Wound Laterality: Left, Medial Cleanser Soap and Water Discharge Instruction: May shower and wash wound with dial antibacterial soap and water prior to dressing change. Wound Cleanser Discharge Instruction: Cleanse the wound with wound cleanser prior to applying a clean dressing using gauze sponges, not tissue or  cotton balls. Peri-Wound Care Triamcinolone 15 (g) Discharge Instruction: to periwouind mixed with lotion Sween Lotion (Moisturizing lotion) Discharge Instruction: Apply to periwound Topical Primary Dressing Apligraf #1 Secondary Dressing Woven Gauze Sponge, Non-Sterile 4x4 in Discharge Instruction: Apply over primary dressing as directed. ABD Pad, 8x10 Discharge Instruction: Apply over primary dressing as directed. Secured With Compression Wrap FourPress (4 layer compression wrap) Discharge Instruction: Apply four layer compression as directed. May also use Miliken CoFlex 2 layer compression system as alternative. Compression Stockings Add-Ons Electronic Signature(s) Signed: 07/07/2021 5:58:16 PM By: Pam [name] Entered By: Pam [name] on 07/07/2021 15:05:32 -------------------------------------------------------------------------------- Vitals Details Patient Name: Date of Service: Pam [name], Pam [name] 07/07/2021 2:30 PM Medical Record Number: [id number] Patient Account Number: [id number] Date of Birth/Sex: Treating RN: May 27, 1941 (80 y.o. Pam [name] Primary Care Yarelis Ambrosino: Pam [name] Other Clinician: Referring Fatemah Pourciau: Treating Jeyla Bulger/Extender: Pam [name] in Treatment: 7 Vital Signs Time Taken: 15:09 Temperature (F): 97.5 Height (in): 64 Pulse (bpm): 99 Weight (lbs): 180 Respiratory Rate (breaths/min): 16 Body Mass Index (BMI): 30.9 Blood Pressure (mmHg): 142/80 Reference Range: 80 - 120 mg / dl Electronic Signature(s) Signed: 07/07/2021 5:58:16 PM By: Pam [name] Entered By: Pam [name] on 07/07/2021 15:09:46

## 2021-07-07 NOTE — Progress Notes (Signed)
Pam [name], Pam [name] ([id number]) Visit Report for 07/07/2021 SuperBill Details Patient Name: Date of Service: Pam [name], Pam [name] 07/07/2021 Medical Record Number: [id number] Patient Account Number: [id number] Date of Birth/Sex: Treating RN: [birth year] (80 y.o. [name] Primary Care Provider: [name] Other Clinician: Referring Provider: Treating Provider/Extender: Angus Palms Weeks in Treatment: 7 Diagnosis Coding ICD-10 Codes Code Description [phone] Chronic venous hypertension (idiopathic) with ulcer and inflammation of left lower extremity L97.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 of skin Facility Procedures CPT4 Code Description Modifier Quantity [id number] (Facility Use Only) [id number] - APPLY MULTLAY COMPRS LWR LT LEG 1 ICD-10 Diagnosis Description I87.332 Chronic venous hypertension (idiopathic) with ulcer and inflammation of left lower extremity L97.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 of skin Electronic Signature(s) Signed: 07/07/2021 4:50:28 PM By: Baltazar Najjar MD Signed: 07/07/2021 5:58:16 PM By: [name] Iba Entered By: [name] Iba on 07/07/2021 15:25:05

## 2021-07-14 ENCOUNTER — Other Ambulatory Visit: Payer: Self-pay

## 2021-07-14 ENCOUNTER — Encounter (HOSPITAL_BASED_OUTPATIENT_CLINIC_OR_DEPARTMENT_OTHER): Payer: Medicare PPO | Attending: Internal Medicine | Admitting: Physician Assistant

## 2021-07-14 DIAGNOSIS — L97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 of skin: Secondary | ICD-10-CM | POA: Insufficient documentation

## 2021-07-14 DIAGNOSIS — L97829 Non-pressure chronic ulcer of other part of left lower leg with unspecified severity: Secondary | ICD-10-CM | POA: Diagnosis present

## 2021-07-14 DIAGNOSIS — I87332 Chronic venous hypertension (idiopathic) with ulcer and inflammation of left lower extremity: Secondary | ICD-10-CM | POA: Diagnosis not present

## 2021-07-15 NOTE — Progress Notes (Addendum)
DEVAN, [name] ([id number]) Visit Report for 07/14/2021 Chief Complaint Document Details Patient Name: Date of Service: Pam, [name] 07/14/2021 2:30 PM Medical Record Number: [id number] Patient Account Number: [id number] Date of Birth/Sex: Treating RN: 26-May-1941 (80 y.o. Pam [name] Standard Primary Care Provider: [name] Other Clinician: Referring Provider: Treating Provider/Extender: Henreitta Leber Weeks in Treatment: 8 Information Obtained from: Patient Chief Complaint 05/14/2021; patient is here for review of a wound on her left medial lower leg Electronic Signature(s) Signed: 07/14/2021 2:45:21 PM By: Lenda Kelp PA-C Entered By: Lenda Kelp on 07/14/2021 14:45:21 -------------------------------------------------------------------------------- Cellular or Tissue Based Product Details Patient Name: Date of Service: ZYRIAH, MASK 07/14/2021 2:30 PM Medical Record Number: [id number] Patient Account Number: [id number] Date of Birth/Sex: Treating RN: 04-07-1941 (80 y.o. Pam [name] Standard Primary Care Provider: [name] Other Clinician: Referring Provider: Treating Provider/Extender: Henreitta Leber Weeks in Treatment: 8 Cellular or Tissue Based Product Type Wound #1 Left,Medial Lower Leg Applied to: Performed By: Physician Lenda Kelp, PA Cellular or Tissue Based Product Type: Apligraf Level of Consciousness (Pre-procedure): Awake and Alert Pre-procedure Verification/Time Out Yes - 15:23 Taken: Location: trunk / arms / legs Wound Size (sq cm): 6.96 Product Size (sq cm): 44 Waste Size (sq cm): 22 Waste Reason: wound size Amount of Product Applied (sq cm): 22 Instrument Used: Forceps, Scissors Lot #: [id number] Order #: 2 Expiration Date: 07/22/2021 Fenestrated: Yes Instrument: Blade Reconstituted: Yes Solution Type: saline Solution Amount: 3 ml Lot #: [id number] Solution Expiration Date: 08/12/2022 Secured: Yes Secured With:  Steri-Strips Dressing Applied: Yes Primary Dressing: adaptic Procedural Pain: 0 Post Procedural Pain: 0 Response to Treatment: Procedure was tolerated well Level of Consciousness (Post- Awake and Alert procedure): Post Procedure Diagnosis Same as Pre-procedure Electronic Signature(s) Signed: 07/14/2021 5:22:07 PM By: Lenda Kelp PA-C Signed: 07/14/2021 5:57:23 PM By: Zenaida Deed RN, BSN Entered By: Zenaida Deed on 07/14/2021 15:26:06 -------------------------------------------------------------------------------- HPI Details Patient Name: Date of Service: Pam, DEARMAN [name] 07/14/2021 2:30 PM Medical Record Number: [id number] Patient Account Number: [id number] Date of Birth/Sex: Treating RN: 1941-01-10 (80 y.o. Pam [name] Standard Primary Care Provider: [name] Other Clinician: Referring Provider: Treating Provider/Extender: Henreitta Leber Weeks in Treatment: 8 History of Present Illness HPI Description: ADMISSION 05/14/2021 with This is a 80 year old reasonably healthy woman who is still working at [employer] and about to retire at the end of the month. She has had a problem with lower extremity edema for about the last 6 months she tells me. 2 months ago she noted small bumps on both of her legs however the left one medially morphed into a large blister. She was seen by her primary physician Dr. [name] on 04/19/2021. Put on doxycycline. He noted at that time that the wound had been weeping for the last 5 weeks. The patient does not have a wound history. 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 We used Iodoflex under compression last week. 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 We have been using Iodoflex to clean up the wound surface. She came in with drainage. 6/24; wound on the  left medial lower leg. Using Iodoflex to clean up the wound surface. We are making gradual progress with that. Nursing reported an odor and drainage. 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 Mentioning this because I would like to see about Apligraf 6/29; left medial lower leg using Iodoflex to date. Making gradual improvement with a nonviable surface. 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 Close to ordering Apligraf through her insuranceo Next week 7/6; still have not heard about Apligraf through her insurance. She is making gradual improvement in the condition of this wound bed. She is retired and is now under [employer]. We have been using Hydrofera Blue under compression This is a fairly large wound. I have suspected that this was a venous insufficiency ulcer. As it is not really improving with standard therapy I have elected to go ahead and order venous reflux studies today 7/13; she is approved for Apligraf we will order that for next week. She continues to make decent improvements slightly smaller today surface looks better we have been using Hydrofera Blue under compression. She has her venous reflux studies next 7/20; Apligraf #1 07/14/2021 upon evaluation today patient appears to be doing excellent in regard to her wound. This is actually the first time I am seeing her. With that being said she does have good progress and improvement noted based on what I am seeing today and this is actually a progression #2 a for Apligraf. Electronic Signature(s) Signed: 07/14/2021 4:23:01 PM By: Lenda Kelp PA-C Entered By: Lenda Kelp on 07/14/2021  16:23:00 -------------------------------------------------------------------------------- Physical Exam Details Patient Name: Date of Service: Pam [name], THEBEAU 07/14/2021 2:30 PM Medical Record Number: [id number] Patient Account Number: [id number] Date of Birth/Sex: Treating RN: 16-Apr-1941 (80 y.o. Pam [name] Standard Primary Care Provider: [name] Other Clinician: Referring Provider: Treating Provider/Extender: Henreitta Leber Weeks in Treatment: 8 Constitutional Well-nourished and well-hydrated in no acute distress. Respiratory normal breathing without difficulty. Psychiatric this patient is able to make decisions and demonstrates good insight into disease process. Alert and Oriented x 3. pleasant and cooperative. Notes Upon inspection patient's wound bed actually showed signs of good granulation epithelization there was no need for sharp debridement today. I was actually able to clear away some of the slough on the surface of the wound just with saline gauze and post mechanical debridement the wound bed appears to be doing great. Overall I am very pleased in that regard and I did go ahead and proceed with applying the second Apligraf today which again I think is perfect for her I think she is doing great with this. Electronic Signature(s) Signed: 07/14/2021 4:23:30 PM By: Lenda Kelp PA-C Entered By: Lenda Kelp on 07/14/2021 16:23:30 -------------------------------------------------------------------------------- Physician Orders Details Patient Name: Date of Service: DORIANN, ZUCH 07/14/2021 2:30 PM Medical Record Number: [id number] Patient Account Number: [id number] Date of Birth/Sex: Treating RN: 1941-07-26 (80 y.o. Billy Coast, Linda Primary Care Provider: [name] Other Clinician: Referring Provider: Treating Provider/Extender: Henreitta Leber Weeks in Treatment: 8 Verbal / Phone Orders: No Diagnosis Coding ICD-10 Coding Code  Description [phone] Chronic venous hypertension (idiopathic) with ulcer and inflammation of left lower extremity L97.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 of skin Follow-up Appointments ppointment in 2 weeks. - with Dr. Leanord Hawking Return A Nurse Visit: - 1 week for rewrap post apligraf  Cellular or Tissue Based Products Cellular or Tissue Based Product Type: - Apligraf #2 Bathing/ Shower/ Hygiene May shower with protection but do not get wound dressing(s) wet. Edema Control - Lymphedema / SCD / Other Elevate legs to the level of the heart or above for 30 minutes daily and/or when sitting, a frequency of: - throughout the day Avoid standing for long periods of time. Exercise regularly Additional Orders / Instructions Follow Nutritious Diet Wound Treatment Wound #1 - Lower Leg Wound Laterality: Left, Medial Cleanser: Soap and Water 1 x Per Week/30 Days Discharge Instructions: May shower and wash wound with dial antibacterial soap and water prior to dressing change. Cleanser: Wound Cleanser 1 x Per Week/30 Days Discharge Instructions: Cleanse the wound with wound cleanser prior to applying a clean dressing using gauze sponges, not tissue or cotton balls. Peri-Wound Care: Triamcinolone 15 (g) 1 x Per Week/30 Days Discharge Instructions: to periwouind mixed with lotion Peri-Wound Care: Sween Lotion (Moisturizing lotion) 1 x Per Week/30 Days Discharge Instructions: Apply to periwound Prim Dressing: Apligraf #2 ary 1 x Per Week/30 Days Secondary Dressing: Woven Gauze Sponge, Non-Sterile 4x4 in 1 x Per Week/30 Days Discharge Instructions: Apply over primary dressing as directed. Secondary Dressing: ABD Pad, 8x10 1 x Per Week/30 Days Discharge Instructions: Apply over primary dressing as directed. Compression Wrap: CoFlex TLC XL 2-layer Compression System 4x7 (in/yd) 1 x Per Week/30 Days Discharge Instructions: Apply CoFlex 2-layer compression as  directed. (alt for 4 layer) Electronic Signature(s) Signed: 07/14/2021 5:22:07 PM By: Lenda Kelp PA-C Signed: 07/14/2021 5:57:23 PM By: Zenaida Deed RN, BSN Entered By: Zenaida Deed on 07/14/2021 15:27:55 -------------------------------------------------------------------------------- Problem List Details Patient Name: Date of Service: CARIA, TRANSUE 07/14/2021 2:30 PM Medical Record Number: [id number] Patient Account Number: [id number] Date of Birth/Sex: Treating RN: Mar 04, 1941 (80 y.o. Pam [name] Standard Primary Care Provider: [name] Other Clinician: Referring Provider: Treating Provider/Extender: Henreitta Leber Weeks in Treatment: 8 Active Problems ICD-10 Encounter Code Description Active Date MDM Diagnosis I87.332 Chronic venous hypertension (idiopathic) with ulcer and inflammation of left 05/14/2021 No Yes lower extremity L97.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 05/14/2021 No Yes of skin Inactive Problems Resolved Problems Electronic Signature(s) Signed: 07/14/2021 2:44:33 PM By: Lenda Kelp PA-C Entered By: Lenda Kelp on 07/14/2021 14:44:32 -------------------------------------------------------------------------------- Progress Note Details Patient Name: Date of Service: Pam [name], CORPENING 07/14/2021 2:30 PM Medical Record Number: [id number] Patient Account Number: [id number] Date of Birth/Sex: Treating RN: 03-21-1941 (80 y.o. Pam [name] Standard Primary Care Provider: [name] Other Clinician: Referring Provider: Treating Provider/Extender: Henreitta Leber Weeks in Treatment: 8 Subjective Chief Complaint Information obtained from Patient 05/14/2021; patient is here for review of a wound on her left medial lower leg History of Present Illness (HPI) ADMISSION 05/14/2021 with This is a 80 year old reasonably healthy woman who is still working at [employer] and about to retire at the  end of the month. She has had a problem with lower extremity edema for about the last 6 months she tells me. 2 months ago she noted small bumps on both of her legs however the left one medially morphed into a large blister. She was seen by her primary physician Dr. [name] on 04/19/2021. Put on doxycycline. He noted at that time that the wound had been weeping for the last 5 weeks. The patient does not have a wound history. 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 We used Iodoflex under compression last week. 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 We have been using Iodoflex to clean up the wound surface. She came in with drainage. 6/24; wound on the left medial lower leg. Using Iodoflex to clean up the wound surface. We are making gradual progress with that. Nursing reported an odor and drainage. 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 Mentioning this because I would like to see about Apligraf 6/29; left medial lower leg using Iodoflex to date. Making gradual improvement with a nonviable surface. 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 Close to ordering Apligraf through her insuranceo Next week 7/6; still have not heard about Apligraf through her insurance. She is making gradual improvement in the condition of this wound bed. She is retired and is now under [employer]. We have been using Hydrofera Blue under compression This is a fairly large wound. I have suspected that this was a venous insufficiency ulcer. As it is not really  improving with standard therapy I have elected to go ahead and order venous reflux studies today 7/13; she is approved for Apligraf we will order that for next week. She continues to make decent improvements slightly smaller today surface looks better we have been using Hydrofera Blue under compression. She has her venous reflux studies next 7/20; Apligraf #1 07/14/2021 upon evaluation today patient appears to be doing excellent in regard to her wound. This is actually the first time I am seeing her. With that being said she does have good progress and improvement noted based on what I am seeing today and this is actually a progression #2 a for Apligraf. Objective Constitutional Well-nourished and well-hydrated in no acute distress. Vitals Time Taken: 2:41 PM, Height: 64 in, Weight: 180 lbs, BMI: 30.9, Temperature: 98.0 F, Pulse: 66 bpm, Respiratory Rate: 18 breaths/min, Blood Pressure: 134/79 mmHg. Respiratory normal breathing without difficulty. Psychiatric this patient is able to make decisions and demonstrates good insight into disease process. Alert and Oriented x 3. pleasant and cooperative. General Notes: Upon inspection patient's wound bed actually showed signs of good granulation epithelization there was no need for sharp debridement today. I was actually able to clear away some of the slough on the surface of the wound just with saline gauze and post mechanical debridement the wound bed appears to be doing great. Overall I am very pleased in that regard and I did go ahead and proceed with applying the second Apligraf today which again I think is perfect for her I think she is doing great with this. Integumentary (Hair, Skin) Wound #1 status is Open. Original cause of wound was Gradually Appeared. The date acquired was: 03/14/2021. The wound has been in treatment 8 weeks. The wound is located on the Left,Medial Lower Leg. The wound measures 2.4cm length x 2.9cm width x 0.1cm depth;  5.466cm^2 area and 0.547cm^3 volume. There is Fat Layer (Subcutaneous Tissue) exposed. There is no tunneling or undermining noted. There is a medium amount of serosanguineous drainage noted. The wound margin is distinct with the outline attached  to the wound base. There is medium (34-66%) red, pink granulation within the wound bed. There is a medium (34-66%) amount of necrotic tissue within the wound bed including Adherent Slough. 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 A skin graft procedure using a bioengineered skin substitute/cellular or tissue based product was performed by Lenda KelpStone III, Shilpa Bushee, PA with the following instrument(s): Forceps and Scissors. Apligraf was applied and secured with Steri-Strips. 22 sq cm of product was utilized and 22 sq cm was wasted due to wound size. Post Application, adaptic was applied. A Time Out was conducted at 15:23, prior to the start of the procedure. The procedure was tolerated well with a pain level of 0 throughout and a pain level of 0 following the procedure. Post procedure Diagnosis Wound #1: Same as Pre-Procedure . Pre-procedure diagnosis of Wound #1 is a Venous Leg Ulcer located on the Left,Medial Lower Leg . There was a Double Layer Compression Therapy Procedure by Shawn Stalleaton, Bobbi, RN. Post procedure Diagnosis Wound #1: Same as Pre-Procedure Plan Follow-up Appointments: Return Appointment in 2 weeks. - with Dr. Leanord Hawkingobson Nurse Visit: - 1 week for rewrap post apligraf Cellular or Tissue Based Products: Cellular or Tissue Based Product Type: - Apligraf #2 Bathing/ Shower/ Hygiene: May shower with protection but do not get wound dressing(s) wet. Edema Control -  Lymphedema / SCD / Other: Elevate legs to the level of the heart or above for 30 minutes daily and/or when sitting, a frequency of: - throughout the day Avoid standing for long periods of time. Exercise regularly Additional Orders / Instructions: Follow Nutritious Diet WOUND #1: - Lower Leg Wound Laterality: Left, Medial Cleanser: Soap and Water 1 x Per Week/30 Days Discharge Instructions: May shower and wash wound with dial antibacterial soap and water prior to dressing change. Cleanser: Wound Cleanser 1 x Per Week/30 Days Discharge Instructions: Cleanse the wound with wound cleanser prior to applying a clean dressing using gauze sponges, not tissue or cotton balls. Peri-Wound Care: Triamcinolone 15 (g) 1 x Per Week/30 Days Discharge Instructions: to periwouind mixed with lotion Peri-Wound Care: Sween Lotion (Moisturizing lotion) 1 x Per Week/30 Days Discharge Instructions: Apply to periwound Prim Dressing: Apligraf #2 1 x Per Week/30 Days ary Secondary Dressing: Woven Gauze Sponge, Non-Sterile 4x4 in 1 x Per Week/30 Days Discharge Instructions: Apply over primary dressing as directed. Secondary Dressing: ABD Pad, 8x10 1 x Per Week/30 Days Discharge Instructions: Apply over primary dressing as directed. Com pression Wrap: CoFlex TLC XL 2-layer Compression System 4x7 (in/yd) 1 x Per Week/30 Days Discharge Instructions: Apply CoFlex 2-layer compression as directed. (alt for 4 layer) 1. Recommend currently that we going continue with the wound care measures as before with regard to the Apligraf again that is already been applied as of today. 2. Also can recommend the patient continue to elevate her legs much as possible to help with edema control. 3. I would also recommend that she go ahead and utilize the Coflex 2 layer compression which I do believe is doing a great job as well for her and overall I think that we can reevaluated 1 week and see where things stand with regard to tension of  the wrap we will see her in 2 weeks for reapplication of  the Apligraf. We will see patient back for reevaluation in 2 weeks here in the clinic. If anything worsens or changes patient will contact our office for additional recommendations. Electronic Signature(s) Signed: 07/14/2021 4:24:08 PM By: Lenda Kelp PA-C Entered By: Lenda Kelp on 07/14/2021 16:24:08 -------------------------------------------------------------------------------- SuperBill Details Patient Name: Date of Service: Pam [name], HUETHER 07/14/2021 Medical Record Number: [id number] Patient Account Number: [id number] Date of Birth/Sex: Treating RN: March 15, 1941 (80 y.o. Pam [name] Standard Primary Care Provider: [name] Other Clinician: Referring Provider: Treating Provider/Extender: Henreitta Leber Weeks in Treatment: 8 Diagnosis Coding ICD-10 Codes Code Description [phone] Chronic venous hypertension (idiopathic) with ulcer and inflammation of left lower extremity L97.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 of skin Facility Procedures CPT4 Code: [id number] Description: (Facility Use Only) Apligraf 1 SQ CM Modifier: Quantity: 44 CPT4 Code: [id number] Description: 15271 - SKIN SUB GRAFT TRNK/ARM/LEG ICD-10 Diagnosis Description L97.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 Modifier: of skin Quantity: 1 Physician Procedures : CPT4 Code Description Modifier [id number] - WC PHYS SKIN SUB GRAFT TRNK/ARM/LEG ICD-10 Diagnosis Description L97.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 of skin Quantity: 1 Electronic Signature(s) Signed: 07/14/2021 4:24:18 PM By: Lenda Kelp PA-C Entered By: Lenda Kelp on 07/14/2021 16:24:18

## 2021-07-16 NOTE — Progress Notes (Signed)
Pam [name], Pam [name] ([id number]) Visit Report for 07/14/2021 Arrival Information Details Patient Name: Date of Service: Pam, [name] 07/14/2021 2:30 PM Medical Record Number: [id number] Patient Account Number: [id number] Date of Birth/Sex: Treating RN: 25-Aug-1941 (80 y.o. [name] Primary Care Eligio Angert: Donald Prose Other Clinician: Referring Payson Crumby: Treating Makye Radle/Extender: Barnie Alderman Weeks in Treatment: 8 Visit Information History Since Last Visit Added or deleted any medications: No Patient Arrived: Ambulatory Any new allergies or adverse reactions: No Arrival Time: 14:40 Had a fall or experienced change in No Accompanied By: alone activities of daily living that may affect Transfer Assistance: None risk of falls: Patient Identification Verified: Yes Signs or symptoms of abuse/neglect since last visito No Secondary Verification Process Completed: Yes Hospitalized since last visit: No Patient Requires Transmission-Based Precautions: No Implantable device outside of the clinic excluding No Patient Has Alerts: No cellular tissue based products placed in the center since last visit: Has Dressing in Place as Prescribed: Yes Has Compression in Place as Prescribed: Yes Pain Present Now: No Electronic Signature(s) Signed: 07/16/2021 12:35:09 PM By: Levan Hurst RN, BSN Entered By: Levan Hurst on 07/14/2021 14:41:10 -------------------------------------------------------------------------------- Compression Therapy Details Patient Name: Date of Service: Pam [name], Pam [name] 07/14/2021 2:30 PM Medical Record Number: [id number] Patient Account Number: [id number] Date of Birth/Sex: Treating RN: [birth year] (80 y.o. Elam Dutch Primary Care Renesha Lizama: Donald Prose Other Clinician: Referring Danniell Rotundo: Treating Tirzah Fross/Extender: Barnie Alderman Weeks in Treatment: 8 Compression Therapy Performed for Wound Assessment: Wound #1 Left,Medial Lower  Leg Performed By: Clinician Deon Pilling, RN Compression Type: Double Layer Post Procedure Diagnosis Same as Pre-procedure Electronic Signature(s) Signed: 07/14/2021 5:57:23 PM By: Baruch Gouty RN, BSN Entered By: Baruch Gouty on 07/14/2021 15:22:58 -------------------------------------------------------------------------------- Encounter Discharge Information Details Patient Name: Date of Service: Pam [name], Pam [name] 07/14/2021 2:30 PM Medical Record Number: [id number] Patient Account Number: [id number] Date of Birth/Sex: Treating RN: [birth year] (80 y.o. Debby Bud Primary Care Chassity Ludke: Donald Prose Other Clinician: Referring Debraann Livingstone: Treating Reonna Finlayson/Extender: Barnie Alderman Weeks in Treatment: 8 Encounter Discharge Information Items Post Procedure Vitals Discharge Condition: Stable Temperature (F): 98 Ambulatory Status: Ambulatory Pulse (bpm): 66 Discharge Destination: Home Respiratory Rate (breaths/min): 18 Transportation: Private Auto Blood Pressure (mmHg): 134/79 Accompanied By: self Schedule Follow-up Appointment: Yes Clinical Summary of Care: Electronic Signature(s) Signed: 07/14/2021 4:43:23 PM By: Deon Pilling Entered By: Deon Pilling on 07/14/2021 16:37:29 -------------------------------------------------------------------------------- Lower Extremity Assessment Details Patient Name: Date of Service: Pam [name], Pam [name] 07/14/2021 2:30 PM Medical Record Number: [id number] Patient Account Number: [id number] Date of Birth/Sex: Treating RN: 02/14/1941 (80 y.o. [name] Primary Care Gabino Hagin: Donald Prose Other Clinician: Referring Colton Tassin: Treating Elvenia Godden/Extender: Barnie Alderman Weeks in Treatment: 8 Edema Assessment Assessed: [Left: No] [Right: No] Edema: [Left: Ye] [Right: s] Calf Left: Right: Point of Measurement: 35 cm From Medial Instep 31.8 cm Ankle Left: Right: Point of Measurement: 9 cm From Medial Instep  22 cm Vascular Assessment Pulses: Dorsalis Pedis Palpable: [Left:Yes] Electronic Signature(s) Signed: 07/16/2021 12:35:09 PM By: Levan Hurst RN, BSN Entered By: Levan Hurst on 07/14/2021 14:47:52 -------------------------------------------------------------------------------- [place] Details Patient Name: Date of Service: Pam [name], Pam [name] 07/14/2021 2:30 PM Medical Record Number: [id number] Patient Account Number: [id number] Date of Birth/Sex: Treating RN: 01-18-1941 (80 y.o. 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 Compression as ordered Treatment Activities: Therapeutic compression applied : 05/14/2021 Notes: Wound/Skin Impairment Nursing Diagnoses: Impaired tissue integrity Goals: Patient/caregiver will verbalize understanding of skin care regimen Date Initiated: 05/14/2021 Target Resolution Date: 08/11/2021 Goal Status: Active Ulcer/skin breakdown will have a volume reduction of 30% by week 4 Date Initiated: 05/14/2021 Date Inactivated: 06/16/2021 Target Resolution Date: 06/18/2021 Goal Status: Met Interventions: Assess patient/caregiver ability to obtain necessary supplies Assess patient/caregiver ability to perform ulcer/skin care regimen upon admission and as needed Assess ulceration(s) every visit Provide education on ulcer and  skin care Treatment Activities: Skin care regimen initiated : 05/14/2021 Topical wound management initiated : 05/14/2021 Notes: Electronic Signature(s) Signed: 07/14/2021 5:57:23 PM By: Baruch Gouty RN, BSN Entered By: Baruch Gouty on 07/14/2021 17:21:54 -------------------------------------------------------------------------------- Pain Assessment Details Patient Name: Date of Service: Pam [name], Pam [name] 07/14/2021 2:30 PM Medical Record Number: [id number] Patient Account Number: [id number] Date of Birth/Sex: Treating RN: 06-17-1941 (80 y.o. [name] Primary Care Athina Fahey: Donald Prose Other Clinician: Referring Veleria Barnhardt: Treating Margrett Kalb/Extender: Barnie Alderman Weeks in Treatment: 8 Active Problems Location of Pain Severity and Description of Pain Patient Has Paino No Site Locations Pain Management and Medication Current Pain Management: Electronic Signature(s) Signed: 07/16/2021 12:35:09 PM By: Levan Hurst RN, BSN Entered By: Levan Hurst on 07/14/2021 14:47:29 -------------------------------------------------------------------------------- Patient/Caregiver Education Details Patient Name: Date of Service: Pam [name] 8/3/2022andnbsp2:30 PM Medical Record Number: [id number] Patient Account Number: [id number] Date of Birth/Gender: Treating RN: February 05, 1941 (80 y.o. Elam Dutch Primary Care Physician: Donald Prose Other Clinician: Referring Physician: Treating Physician/Extender: Barnie Alderman Weeks in Treatment: 8 Education Assessment Education Provided To: Patient Education Topics Provided Venous: Methods: Explain/Verbal Responses: Reinforcements needed, State content correctly Wound/Skin Impairment: Methods: Explain/Verbal Responses: Reinforcements needed, State content correctly Electronic Signature(s) Signed: 07/14/2021 5:57:23 PM By: Baruch Gouty RN, BSN Entered By: Baruch Gouty on 07/14/2021  17:22:28 -------------------------------------------------------------------------------- Wound Assessment Details Patient Name: Date of Service: Pam [name], Pam [name] 07/14/2021 2:30 PM Medical Record Number: [id number] Patient Account Number: [id number] Date of Birth/Sex: Treating RN: 1941/11/03 (80 y.o. [name] Primary Care Kassiah Mccrory: Donald Prose Other Clinician: Referring Andrey Hoobler: Treating Candice Tobey/Extender: Barnie Alderman Weeks in Treatment: 8 Wound Status Wound Number: 1 Primary Etiology: Venous Leg Ulcer Wound Location: Left, Medial Lower Leg Wound Status: Open Wounding Event: Gradually Appeared Comorbid History: Cataracts, Glaucoma, Hypertension, Osteoarthritis Date Acquired: 03/14/2021 Weeks Of Treatment: 8 Clustered Wound: No Photos Wound Measurements Length: (cm) 2.4 Width: (cm) 2.9 Depth: (cm) 0.1 Area: (cm) 5.466 Volume: (cm) 0.547 % Reduction in Area: 88.1% % Reduction in Volume: 88.1% Epithelialization: Small (1-33%) Tunneling: No Undermining: No Wound Description Classification: Full Thickness With Exposed Support Structures Wound Margin: Distinct, outline attached Exudate Amount: Medium Exudate Type: Serosanguineous Exudate Color: red, brown Foul Odor After Cleansing: No Slough/Fibrino Yes Wound Bed Granulation Amount: Medium (34-66%) Exposed Structure Granulation Quality: Red, Pink Fascia Exposed: No Necrotic Amount: Medium (34-66%) Fat Layer (Subcutaneous Tissue) Exposed: Yes Necrotic Quality: Adherent Slough Tendon Exposed: No Muscle Exposed: No Joint Exposed: No Bone Exposed: No Treatment Notes Wound #1 (Lower Leg) Wound Laterality: Left, Medial Cleanser Soap and Water Discharge Instruction: May shower and wash wound with dial antibacterial soap and water prior to dressing  change. Wound Cleanser Discharge Instruction: Cleanse the wound with wound cleanser prior to applying a clean dressing using gauze sponges, not tissue  or cotton balls. Peri-Wound Care Triamcinolone 15 (g) Discharge Instruction: to periwouind mixed with lotion Sween Lotion (Moisturizing lotion) Discharge Instruction: Apply to periwound Topical Primary Dressing Apligraf #2 Secondary Dressing Woven Gauze Sponge, Non-Sterile 4x4 in Discharge Instruction: Apply over primary dressing as directed. ABD Pad, 8x10 Discharge Instruction: Apply over primary dressing as directed. Secured With Compression Wrap CoFlex TLC XL 2-layer Compression System 4x7 (in/yd) Discharge Instruction: Apply CoFlex 2-layer compression as directed. (alt for 4 layer) Compression Stockings Add-Ons Electronic Signature(s) Signed: 07/16/2021 12:35:09 PM By: Levan Hurst RN, BSN Entered By: Levan Hurst on 07/14/2021 14:54:07 -------------------------------------------------------------------------------- [place] Details Patient Name: Date of Service: Pam [name], Pam [name] 07/14/2021 2:30 PM Medical Record Number: [id number] Patient Account Number: [id number] Date of Birth/Sex: Treating RN: 1941-10-27 (80 y.o. [name] Primary Care Kortney Potvin: Donald Prose Other Clinician: Referring Ashana Tullo: Treating Ryonna Cimini/Extender: Barnie Alderman Weeks in Treatment: 8 Vital Signs Time Taken: 14:41 Temperature (F): 98.0 Height (in): 64 Pulse (bpm): 66 Weight (lbs): 180 Respiratory Rate (breaths/min): 18 Body Mass Index (BMI): 30.9 Blood Pressure (mmHg): 134/79 Reference Range: 80 - 120 mg / dl Electronic Signature(s) Signed: 07/16/2021 12:35:09 PM By: Levan Hurst RN, BSN Entered By: Levan Hurst on 07/14/2021 14:41:29

## 2021-07-21 ENCOUNTER — Other Ambulatory Visit: Payer: Self-pay

## 2021-07-21 ENCOUNTER — Encounter (HOSPITAL_BASED_OUTPATIENT_CLINIC_OR_DEPARTMENT_OTHER): Payer: Medicare PPO | Admitting: Physician Assistant

## 2021-07-21 DIAGNOSIS — I87332 Chronic venous hypertension (idiopathic) with ulcer and inflammation of left lower extremity: Secondary | ICD-10-CM | POA: Diagnosis not present

## 2021-07-21 NOTE — Progress Notes (Signed)
ZARETH, RIPPETOE ([id number]) Visit Report for 07/21/2021 Arrival Information Details Patient Name: Date of Service: Pam, [name] 07/21/2021 2:00 PM Medical Record Number: [id number] Patient Account Number: [id number] Date of Birth/Sex: Treating RN: 1941-02-10 (80 y.o. [name] Primary Care Camara Rosander: [name] Other Clinician: Referring Angelise Petrich: Treating Shannara Winbush/Extender: Henreitta Leber Weeks in Treatment: 9 Visit Information History Since Last Visit Added or deleted any medications: No Patient Arrived: Ambulatory Any new allergies or adverse reactions: No Arrival Time: 13:53 Had a fall or experienced change in No Transfer Assistance: None activities of daily living that may affect Patient Identification Verified: Yes risk of falls: Secondary Verification Process Completed: Yes Signs or symptoms of abuse/neglect since No Patient Requires Transmission-Based Precautions: No last visito Patient Has Alerts: No Hospitalized since last visit: No Implantable device outside of the clinic No excluding cellular tissue based products placed in the center since last visit: Has Dressing in Place as Prescribed: Yes Has Compression in Place as Prescribed: Yes Has Footwear/Offloading in Place as Yes Prescribed: Left: Surgical Shoe with Pressure Relief Insole Pain Present Now: No Electronic Signature(s) Signed: 07/21/2021 3:42:23 PM By: [name] Iba Entered By: [name] Iba on 07/21/2021 13:54:07 -------------------------------------------------------------------------------- Compression Therapy Details Patient Name: Date of Service: Pam, DOXTATER [name] 07/21/2021 2:00 PM Medical Record Number: [id number] Patient Account Number: [id number] Date of Birth/Sex: Treating RN: [birth year] (80 y.o. [name] Primary Care Caige Almeda: [name] Other Clinician: Referring Americus Scheurich: Treating Braxson Hollingsworth/Extender: Henreitta Leber Weeks in Treatment:  9 Compression Therapy Performed for Wound Assessment: Wound #1 Left,Medial Lower Leg Performed By: Clinician [name] Iba, RN Compression Type: Double Layer Electronic Signature(s) Signed: 07/21/2021 3:42:23 PM By: [name] Iba Entered By: [name] Iba on 07/21/2021 14:12:46 -------------------------------------------------------------------------------- Encounter Discharge Information Details Patient Name: Date of Service: Pam, BACOT [name] 07/21/2021 2:00 PM Medical Record Number: [id number] Patient Account Number: [id number] Date of Birth/Sex: Treating RN: 12/29/1940 (80 y.o. [name] Primary Care Kymberley Raz: [name] Other Clinician: Referring Shawnna Pancake: Treating Jailen Lung/Extender: Henreitta Leber Weeks in Treatment: 9 Encounter Discharge Information Items Discharge Condition: Stable Ambulatory Status: Ambulatory Discharge Destination: Home Transportation: Private Auto Schedule Follow-up Appointment: Yes Clinical Summary of Care: Provided on 07/21/2021 Form Type Recipient Paper Patient Patient Electronic Signature(s) Signed: 07/21/2021 3:42:23 PM By: [name] Iba Entered By: [name] Iba on 07/21/2021 14:13:34 -------------------------------------------------------------------------------- Patient/Caregiver Education Details Patient Name: Date of Service: Pam [name] 8/10/2022andnbsp2:00 PM Medical Record Number: [id number] Patient Account Number: [id number] Date of Birth/Gender: Treating RN: 01-16-1941 (80 y.o. [name] Primary Care Physician: [name] Other Clinician: Referring Physician: Treating Physician/Extender: Henreitta Leber Weeks in Treatment: 9 Education Assessment Education Provided To: Patient Education Topics Provided Venous: Methods: Explain/Verbal, Printed Responses: State content correctly Wound/Skin Impairment: Methods: Explain/Verbal, Printed Responses: State content  correctly Electronic Signature(s) Signed: 07/21/2021 3:42:23 PM By: [name] Iba Entered By: [name] Iba on 07/21/2021 14:13:14 -------------------------------------------------------------------------------- Wound Assessment Details Patient Name: Date of Service: Pam, LUCKMAN [name] 07/21/2021 2:00 PM Medical Record Number: [id number] Patient Account Number: [id number] Date of Birth/Sex: Treating RN: [birth year] (80 y.o. [name] Primary Care Kamesha Herne: [name] Other Clinician: Referring Teniyah Seivert: Treating Shenaya Lebo/Extender: Henreitta Leber Weeks in Treatment: 9 Wound Status Wound Number: 1 Primary Etiology: Venous Leg Ulcer Wound Location: Left, Medial Lower Leg Wound Status: Open Wounding Event: Gradually Appeared Comorbid History: Cataracts, Glaucoma, Hypertension, Osteoarthritis Date Acquired: 03/14/2021 Weeks Of Treatment: 9 Clustered Wound: No Wound Measurements Length: (cm) 2.4 Width: (cm) 2.9 Depth: (cm)  0.1 Area: (cm) 5.466 Volume: (cm) 0.547 % Reduction in Area: 88.1% % Reduction in Volume: 88.1% Epithelialization: Small (1-33%) Tunneling: No Undermining: No Wound Description Classification: Full Thickness With Exposed Support Structures Wound Margin: Distinct, outline attached Exudate Amount: Medium Exudate Type: Serosanguineous Exudate Color: red, brown Foul Odor After Cleansing: No Slough/Fibrino Yes Wound Bed Granulation Amount: Medium (34-66%) Exposed Structure Granulation Quality: Red, Pink Fascia Exposed: No Necrotic Amount: Medium (34-66%) Fat Layer (Subcutaneous Tissue) Exposed: Yes Necrotic Quality: Adherent Slough Tendon Exposed: No Muscle Exposed: No Joint Exposed: No Bone Exposed: No Treatment Notes Wound #1 (Lower Leg) Wound Laterality: Left, Medial Cleanser Soap and Water Discharge Instruction: May shower and wash wound with dial antibacterial soap and water prior to dressing change. Wound  Cleanser Discharge Instruction: Cleanse the wound with wound cleanser prior to applying a clean dressing using gauze sponges, not tissue or cotton balls. Peri-Wound Care Triamcinolone 15 (g) Discharge Instruction: to periwouind mixed with lotion Sween Lotion (Moisturizing lotion) Discharge Instruction: Apply to periwound Topical Primary Dressing Apligraf #2 Secondary Dressing Woven Gauze Sponge, Non-Sterile 4x4 in Discharge Instruction: Apply over primary dressing as directed. ABD Pad, 8x10 Discharge Instruction: Apply over primary dressing as directed. Secured With Compression Wrap CoFlex TLC XL 2-layer Compression System 4x7 (in/yd) Discharge Instruction: Apply CoFlex 2-layer compression as directed. (alt for 4 layer) Compression Stockings Add-Ons Electronic Signature(s) Signed: 07/21/2021 3:42:23 PM By: [name] Iba Entered By: [name] Iba on 07/21/2021 13:51:06 -------------------------------------------------------------------------------- Vitals Details Patient Name: Date of Service: Pam, POULSEN [name] 07/21/2021 2:00 PM Medical Record Number: [id number] Patient Account Number: [id number] Date of Birth/Sex: Treating RN: 03-30-1941 (80 y.o. [name] Primary Care Jewelianna Pancoast: [name] Other Clinician: Referring Darnell Stimson: Treating Kristine Tiley/Extender: Henreitta Leber Weeks in Treatment: 9 Vital Signs Time Taken: 13:55 Temperature (F): 98.4 Height (in): 64 Pulse (bpm): 76 Weight (lbs): 180 Respiratory Rate (breaths/min): 16 Body Mass Index (BMI): 30.9 Blood Pressure (mmHg): 129/80 Reference Range: 80 - 120 mg / dl Electronic Signature(s) Signed: 07/21/2021 3:42:23 PM By: [name] Iba Entered By: [name] Iba on 07/21/2021 13:55:31

## 2021-07-22 NOTE — Progress Notes (Signed)
KERYL, GHOLSON ([id number]) Visit Report for 07/21/2021 [place] Details Patient Name: Date of Service: Pam [name], Pam [name] 07/21/2021 Medical Record Number: [id number] Patient Account Number: [id number] Date of Birth/Sex: Treating RN: [birth year] (80 y.o. [name] Primary Care Provider: [name] Other Clinician: Referring Provider: Treating Provider/Extender: Henreitta Leber Weeks in Treatment: 9 Diagnosis Coding ICD-10 Codes Code Description [phone] Chronic venous hypertension (idiopathic) with ulcer and inflammation of left lower extremity L97.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 of skin Facility Procedures CPT4 Code Description Modifier Quantity [phone] (Facility Use Only) [phone] - APPLY MULTLAY COMPRS LWR LT LEG 1 ICD-10 Diagnosis Description L97.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 of skin Electronic Signature(s) Signed: 07/21/2021 3:42:23 PM By: [name] Iba Signed: 07/22/2021 5:47:57 PM By: Lenda Kelp PA-C Entered By: [name] Iba on 07/21/2021 14:13:49

## 2021-07-28 ENCOUNTER — Encounter (HOSPITAL_BASED_OUTPATIENT_CLINIC_OR_DEPARTMENT_OTHER): Payer: Medicare PPO | Admitting: Internal Medicine

## 2021-07-28 ENCOUNTER — Other Ambulatory Visit: Payer: Self-pay

## 2021-07-28 DIAGNOSIS — I87332 Chronic venous hypertension (idiopathic) with ulcer and inflammation of left lower extremity: Secondary | ICD-10-CM | POA: Diagnosis not present

## 2021-07-28 NOTE — Progress Notes (Signed)
KAELYNNE, CHRISTLEY ([id number]) Visit Report for 07/28/2021 Arrival Information Details Patient Name: Date of Service: CHAMYA, HUNTON 07/28/2021 2:45 PM Medical Record Number: [id number] Patient Account Number: [id number] Date of Birth/Sex: Treating RN: 01-28-1941 (80 y.o. [name] Primary Care Lakysha Kossman: Donald Prose Other Clinician: Referring Taren Dymek: Treating Tahara Ruffini/Extender: Charyl Dancer Weeks in Treatment: 10 Visit Information History Since Last Visit Added or deleted any medications: No Patient Arrived: Ambulatory Any new allergies or adverse reactions: No Arrival Time: 15:38 Had a fall or experienced change in No Accompanied By: alone activities of daily living that may affect Transfer Assistance: None risk of falls: Patient Identification Verified: Yes Signs or symptoms of abuse/neglect since last visito No Secondary Verification Process Completed: Yes Hospitalized since last visit: No Patient Requires Transmission-Based Precautions: No Implantable device outside of the clinic excluding No Patient Has Alerts: No cellular tissue based products placed in the center since last visit: Has Dressing in Place as Prescribed: Yes Has Compression in Place as Prescribed: Yes Pain Present Now: No Electronic Signature(s) Signed: 07/28/2021 6:11:36 PM By: Levan Hurst RN, BSN Entered By: Levan Hurst on 07/28/2021 15:38:40 -------------------------------------------------------------------------------- Lower Extremity Assessment Details Patient Name: Date of Service: TONNA, PALAZZI 07/28/2021 2:45 PM Medical Record Number: [id number] Patient Account Number: [id number] Date of Birth/Sex: Treating RN: [birth year] (80 y.o. [name] Primary Care Javanna Patin: Donald Prose Other Clinician: Referring Anahi Belmar: Treating Kinberly Perris/Extender: Charyl Dancer Weeks in Treatment: 10 Edema Assessment Assessed: Shirlyn Goltz: No] Patrice Paradise: No] Edema: [Left: Ye]  [Right: s] Calf Left: Right: Point of Measurement: 35 cm From Medial Instep 31 cm Ankle Left: Right: Point of Measurement: 9 cm From Medial Instep 21 cm Vascular Assessment Pulses: Dorsalis Pedis Palpable: [Left:Yes] Electronic Signature(s) Signed: 07/28/2021 6:11:36 PM By: Levan Hurst RN, BSN Entered By: Levan Hurst on 07/28/2021 15:47:47 -------------------------------------------------------------------------------- Multi Wound Chart Details Patient Name: Date of Service: JAQUASIA, DOSCHER NELL 07/28/2021 2:45 PM Medical Record Number: [id number] Patient Account Number: [id number] Date of Birth/Sex: Treating RN: February 04, 1941 (80 y.o. [name] Primary Care Massimo Hartland: Donald Prose Other Clinician: Referring Cass Vandermeulen: Treating Caroleann Casler/Extender: Charyl Dancer Weeks in Treatment: 10 Vital Signs Height(in): 64 Pulse(bpm): 73 Weight(lbs): 180 Blood Pressure(mmHg): 142/82 Body Mass Index(BMI): 31 Temperature(F): 97.8 Respiratory Rate(breaths/min): 16 Photos: [1:No Photos Left, Medial Lower Leg] [N/A:N/A N/A] Wound Location: [1:Gradually Appeared] [N/A:N/A] Wounding Event: [1:Venous Leg Ulcer] [N/A:N/A] Primary Etiology: [1:Cataracts, Glaucoma, Hypertension, N/A] Comorbid History: [1:Osteoarthritis 03/14/2021] [N/A:N/A] Date Acquired: [1:10] [N/A:N/A] Weeks of Treatment: [1:Open] [N/A:N/A] Wound Status: [1:2.1x2.4x0.1] [N/A:N/A] Measurements L x W x D (cm) [1:3.958] [N/A:N/A] A (cm) : rea [1:0.396] [N/A:N/A] Volume (cm) : [1:91.40%] [N/A:N/A] % Reduction in Area: [1:91.40%] [N/A:N/A] % Reduction in Volume: [1:Full Thickness With Exposed Support N/A] Classification: [1:Structures Medium] [N/A:N/A] Exudate Amount: [1:Serosanguineous] [N/A:N/A] Exudate Type: [1:red, brown] [N/A:N/A] Exudate Color: [1:Distinct, outline attached] [N/A:N/A] Wound Margin: [1:Large (67-100%)] [N/A:N/A] Granulation Amount: [1:Red, Pink] [N/A:N/A] Granulation Quality:  [1:Small (1-33%)] [N/A:N/A] Necrotic Amount: [1:Fat Layer (Subcutaneous Tissue): Yes N/A] Exposed Structures: [1:Fascia: No Tendon: No Muscle: No Joint: No Bone: No Small (1-33%)] [N/A:N/A] Epithelialization: [1:Cellular or Tissue Based Product] [N/A:N/A] Treatment Notes Electronic Signature(s) Signed: 07/28/2021 5:36:21 PM By: Linton Ham MD Signed: 07/28/2021 6:11:36 PM By: Levan Hurst RN, BSN Entered By: Linton Ham on 07/28/2021 16:51:14 -------------------------------------------------------------------------------- Multi-Disciplinary Care Plan Details Patient Name: Date of Service: SOFFIA, DOSHIER NELL 07/28/2021 2:45 PM Medical Record Number: [id number] Patient Account Number: [id number] Date of Birth/Sex: Treating RN: 11-Jul-1941 (80 y.o. [name] Primary Care Ariba Lehnen: Donald Prose  Other Clinician: Referring Aseret Hoffman: Treating Estefano Victory/Extender: Charyl Dancer Weeks in Treatment: 10 Active Inactive Venous Leg Ulcer Nursing Diagnoses: Actual venous Insuffiency (use after diagnosis is confirmed) Goals: Patient will maintain optimal edema control Date Initiated: 05/14/2021 Target Resolution Date: 08/11/2021 Goal Status: Active Patient/caregiver will verbalize understanding of disease process and disease management Date Initiated: 05/14/2021 Date Inactivated: 06/09/2021 Target Resolution Date: 06/11/2021 Goal Status: Met Interventions: Assess peripheral edema status every visit. Compression as ordered Treatment Activities: Therapeutic compression applied : 05/14/2021 Notes: Wound/Skin Impairment Nursing Diagnoses: Impaired tissue integrity Goals: Patient/caregiver will verbalize understanding of skin care regimen Date Initiated: 05/14/2021 Target Resolution Date: 08/11/2021 Goal Status: Active Ulcer/skin breakdown will have a volume reduction of 30% by week 4 Date Initiated: 05/14/2021 Date Inactivated: 06/16/2021 Target Resolution Date:  06/18/2021 Goal Status: Met Interventions: Assess patient/caregiver ability to obtain necessary supplies Assess patient/caregiver ability to perform ulcer/skin care regimen upon admission and as needed Assess ulceration(s) every visit Provide education on ulcer and skin care Treatment Activities: Skin care regimen initiated : 05/14/2021 Topical wound management initiated : 05/14/2021 Notes: Electronic Signature(s) Signed: 07/28/2021 6:11:36 PM By: Levan Hurst RN, BSN Entered By: Levan Hurst on 07/28/2021 15:49:02 -------------------------------------------------------------------------------- Pain Assessment Details Patient Name: Date of Service: TENIYA, FILTER 07/28/2021 2:45 PM Medical Record Number: [id number] Patient Account Number: [id number] Date of Birth/Sex: Treating RN: Nov 30, 1941 (80 y.o. [name] Primary Care Gwendloyn Forsee: Donald Prose Other Clinician: Referring Torben Soloway: Treating Jaliel Deavers/Extender: Charyl Dancer Weeks in Treatment: 10 Active Problems Location of Pain Severity and Description of Pain Patient Has Paino No Site Locations Pain Management and Medication Current Pain Management: Electronic Signature(s) Signed: 07/28/2021 6:11:36 PM By: Levan Hurst RN, BSN Entered By: Levan Hurst on 07/28/2021 15:39:02 -------------------------------------------------------------------------------- Patient/Caregiver Education Details Patient Name: Date of Service: SHERLON, NIED 8/17/2022andnbsp2:45 PM Medical Record Number: [id number] Patient Account Number: [id number] Date of Birth/Gender: Treating RN: 1941/10/28 (80 y.o. [name] Primary Care Physician: Donald Prose Other Clinician: Referring Physician: Treating Physician/Extender: Gwendalyn Ege in Treatment: 10 Education Assessment Education Provided To: Patient Education Topics Provided Wound/Skin Impairment: Methods: Explain/Verbal Responses: State  content correctly Electronic Signature(s) Signed: 07/28/2021 6:11:36 PM By: Levan Hurst RN, BSN Entered By: Levan Hurst on 07/28/2021 15:50:19 -------------------------------------------------------------------------------- Wound Assessment Details Patient Name: Date of Service: OLEVIA, [place]ST[place] 07/28/2021 2:45 PM Medical Record Number: [id number] Patient Account Number: [id number] Date of Birth/Sex: Treating RN: [birth year] (80 y.o. [name] Primary Care Chritopher Coster: Donald Prose Other Clinician: Referring Tiara Bartoli: Treating Keiara Sneeringer/Extender: Charyl Dancer Weeks in Treatment: 10 Wound Status Wound Number: 1 Primary Etiology: Venous Leg Ulcer Wound Location: Left, Medial Lower Leg Wound Status: Open Wounding Event: Gradually Appeared Comorbid History: Cataracts, Glaucoma, Hypertension, Osteoarthritis Date Acquired: 03/14/2021 Weeks Of Treatment: 10 Clustered Wound: No Wound Measurements Length: (cm) 2.1 Width: (cm) 2.4 Depth: (cm) 0.1 Area: (cm) 3.958 Volume: (cm) 0.396 % Reduction in Area: 91.4% % Reduction in Volume: 91.4% Epithelialization: Small (1-33%) Tunneling: No Undermining: No Wound Description Classification: Full Thickness With Exposed Support Structures Wound Margin: Distinct, outline attached Exudate Amount: Medium Exudate Type: Serosanguineous Exudate Color: red, brown Foul Odor After Cleansing: No Slough/Fibrino Yes Wound Bed Granulation Amount: Large (67-100%) Exposed Structure Granulation Quality: Red, Pink Fascia Exposed: No Necrotic Amount: Small (1-33%) Fat Layer (Subcutaneous Tissue) Exposed: Yes Necrotic Quality: Adherent Slough Tendon Exposed: No Muscle Exposed: No Joint Exposed: No Bone Exposed: No Treatment Notes Wound #1 (Lower Leg) Wound Laterality: Left, Medial Cleanser Soap and Water Discharge Instruction:  May shower and wash wound with dial antibacterial soap and water prior to dressing change. Wound  Cleanser Discharge Instruction: Cleanse the wound with wound cleanser prior to applying a clean dressing using gauze sponges, not tissue or cotton balls. Peri-Wound Care Triamcinolone 15 (g) Discharge Instruction: to periwouind mixed with lotion Sween Lotion (Moisturizing lotion) Discharge Instruction: Apply to periwound Topical Primary Dressing Apligraf #3 Secondary Dressing Woven Gauze Sponge, Non-Sterile 4x4 in Discharge Instruction: Apply over primary dressing as directed. ABD Pad, 8x10 Discharge Instruction: Apply over primary dressing as directed. Secured With Compression Wrap CoFlex TLC XL 2-layer Compression System 4x7 (in/yd) Discharge Instruction: Apply CoFlex 2-layer compression as directed. (alt for 4 layer) Compression Stockings Add-Ons Electronic Signature(s) Signed: 07/28/2021 6:11:36 PM By: Levan Hurst RN, BSN Entered By: Levan Hurst on 07/28/2021 15:48:06 -------------------------------------------------------------------------------- Lavina Details Patient Name: Date of Service: SHANAIA, SIEVERS 07/28/2021 2:45 PM Medical Record Number: [id number] Patient Account Number: [id number] Date of Birth/Sex: Treating RN: 1941-10-09 (80 y.o. [name] Primary Care Provider: Donald Prose Other Clinician: Referring Provider: Treating Provider/Extender: Charyl Dancer Weeks in Treatment: 10 Vital Signs Time Taken: 15:38 Temperature (F): 97.8 Height (in): 64 Pulse (bpm): 73 Weight (lbs): 180 Respiratory Rate (breaths/min): 16 Body Mass Index (BMI): 30.9 Blood Pressure (mmHg): 142/82 Reference Range: 80 - 120 mg / dl Electronic Signature(s) Signed: 07/28/2021 6:11:36 PM By: Levan Hurst RN, BSN Entered By: Levan Hurst on 07/28/2021 15:38:57

## 2021-07-29 NOTE — Progress Notes (Signed)
Pam [name], Pam [name] ([id number]) Visit Report for 07/28/2021 Cellular or Tissue Based Product Details Patient Name: Date of Service: Pam [name], Pam [name] 07/28/2021 2:45 PM Medical Record Number: [id number] Patient Account Number: [id number] Date of Birth/Sex: Treating RN: 15-Jul-1941 (80 y.o. Pam [name] Primary Care Provider: Deatra [name] Other Clinician: Referring Provider: Treating Provider/Extender: Pam [name] Weeks in Treatment: 10 Cellular or Tissue Based Product Type Wound #1 Left,Medial Lower Leg Applied to: Performed By: Physician Pam [name]., [name] Level of Consciousness (Pre-procedure): Awake and Alert Pre-procedure Verification/Time Out No Taken: Location: trunk / arms / legs Wound Size (sq cm): 5.04 Product Size (sq cm): 44 Waste Size (sq cm): 22 Waste Reason: wound size Amount of Product Applied (sq cm): 22 Lot #: [id number] Order #: 3 Expiration Date: 08/04/2021 Fenestrated: Yes Instrument: Blade Reconstituted: Yes Solution Type: normal saline Solution Amount: 6 ml Lot #: [id number] Solution Expiration Date: 08/12/2022 Secured: Yes Secured With: Steri-Strips Dressing Applied: Yes Primary Dressing: gauze, abd, compression wrap Procedural Pain: 0 Post Procedural Pain: 0 Response to Treatment: Procedure was tolerated well Level of Consciousness (Post- Awake and Alert procedure): Post Procedure Diagnosis Same as Pre-procedure Electronic Signature(s) Signed: 07/28/2021 5:36:21 PM By: Pam Najjar MD Entered By: Pam [name] on 07/28/2021 16:51:23 -------------------------------------------------------------------------------- HPI Details Patient Name: Date of Service: Pam [name] 07/28/2021 2:45 PM Medical Record Number: [id number] Patient Account Number: [id number] Date of Birth/Sex: Treating RN: 03-Sep-1941 (80 y.o. Pam [name] Primary Care Provider: Deatra [name] Other  Clinician: Referring Provider: Treating Provider/Extender: Pam [name] Weeks in Treatment: 10 History of Present Illness HPI Description: ADMISSION 05/14/2021 with This is a 80 year old reasonably healthy woman who is still working at [employer] and about to retire at the end of the month. She has had a problem with lower extremity edema for about the last 6 months she tells me. 2 months ago she noted small bumps on both of her legs however the left one medially morphed into a large blister. She was seen by her primary physician Dr. Wynelle [name] on 04/19/2021. Put on doxycycline. He noted at that time that the wound had been weeping for the last 5 weeks. The patient does not have a wound history. 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 We used Iodoflex under compression last week. 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 We have been using Iodoflex to clean up the wound surface. She came in with drainage. 6/24; wound on the left medial lower leg. Using Iodoflex to clean up the wound surface. We are making gradual progress with that. Nursing reported an odor and drainage. 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 Mentioning this because I would like to see about Apligraf 6/29; left medial lower leg using Iodoflex to date. Making gradual improvement with a nonviable surface. 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 Close to ordering Apligraf through her insuranceo Next week 7/6; still have not heard about Apligraf through  her insurance. She is making gradual improvement in the  condition of this wound bed. She is retired and is now under [employer]. We have been using Hydrofera Blue under compression This is a fairly large wound. I have suspected that this was a venous insufficiency ulcer. As it is not really improving with standard therapy I have elected to go ahead and order venous reflux studies today 7/13; she is approved for Apligraf we will order that for next week. She continues to make decent improvements slightly smaller today surface looks better we have been using Hydrofera Blue under compression. She has her venous reflux studies next 7/20; Apligraf #1 07/14/2021 upon evaluation today patient appears to be doing excellent in regard to her wound. This is actually the first time I am seeing her. With that being said she does have good progress and improvement noted based on what I am seeing today and this is actually a progression #2 a for Apligraf. 8/17; improvement Apligraf #3. No concerns stated by the patient Electronic Signature(s) Signed: 07/28/2021 5:36:21 PM By: Pam Najjarobson, Pam Vivona MD Entered By: Pam Najjarobson, Pam [name] on 07/28/2021 16:51:50 -------------------------------------------------------------------------------- Physical Exam Details Patient Name: Date of Service: Pam [name], Pam [name] 07/28/2021 2:45 PM Medical Record Number: [id number] Patient Account Number: [id number] Date of Birth/Sex: Treating RN: September 13, 1941 (80 y.o. Pam LinkF) Pam [name], Pam [name] Primary Care Provider: Deatra JamesSun, Pam [name] Other Clinician: Referring Provider: Treating Provider/Extender: Pam Palmsobson, Pam [name] Sun, Pam [name] Weeks in Treatment: 10 Constitutional Sitting or standing Blood Pressure is within target range for patient.. Pulse regular and within target range for patient.Marland Kitchen. Respirations regular, non-labored and within target range.. Temperature is normal and within the target range for the patient.Marland Kitchen. Appears in no distress. Notes Wound exam; wound bed  actually looks quite good. No requirement for debridement. No evidence of surrounding infection Electronic Signature(s) Signed: 07/28/2021 5:36:21 PM By: Pam Najjarobson, Giavonna Pflum MD Entered By: Pam Najjarobson, Gustin Zobrist on 07/28/2021 16:52:29 -------------------------------------------------------------------------------- Physician Orders Details Patient Name: Date of Service: Pam [name], Pam [name] 07/28/2021 2:45 PM Medical Record Number: [id number] Patient Account Number: [id number] Date of Birth/Sex: Treating RN: September 13, 1941 (80 y.o. Pam LinkF) Pam [name], Pam [name] Primary Care Provider: Deatra JamesSun, Pam [name] Other Clinician: Referring Provider: Treating Provider/Extender: Pam Palmsobson, Cadie Sorci Sun, Pam [name] Weeks in Treatment: 10 Verbal / Phone Orders: No Diagnosis Coding ICD-10 Coding Code Description [id number] Chronic venous hypertension (idiopathic) with ulcer and inflammation of left lower extremity L97.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 of skin Follow-up Appointments ppointment in 2 weeks. - with Dr. Leanord Hawkingobson Return A Nurse Visit: - 1 week for rewrap post apligraf Cellular or Tissue Based Products Cellular or Tissue Based Product Type: - Apligraf #3 Bathing/ Shower/ Hygiene May shower with protection but do not get wound dressing(s) wet. Edema Control - Lymphedema / SCD / Other Elevate legs to the level of the heart or above for 30 minutes daily and/or when sitting, a frequency of: - throughout the day Avoid standing for long periods of time. Exercise regularly Additional Orders / Instructions Follow Nutritious Diet Wound Treatment Wound #1 - Lower Leg Wound Laterality: Left, Medial Cleanser: Soap and Water 1 x Per Week/30 Days Discharge Instructions: May shower and wash wound with dial antibacterial soap and water prior to dressing change. Cleanser: Wound Cleanser 1 x Per Week/30 Days Discharge Instructions: Cleanse the wound with wound cleanser prior to applying a clean dressing  using gauze sponges, not tissue or cotton balls. Peri-Wound Care: Triamcinolone 15 (g) 1 x Per Week/30 Days Discharge Instructions: to periwouind mixed with lotion Peri-Wound Care: Sween Lotion (Moisturizing lotion) 1 x Per Week/30 Days Discharge  Instructions: Apply to periwound Prim Dressing: Apligraf #3 ary 1 x Per Week/30 Days Secondary Dressing: Woven Gauze Sponge, Non-Sterile 4x4 in 1 x Per Week/30 Days Discharge Instructions: Apply over primary dressing as directed. Secondary Dressing: ABD Pad, 8x10 1 x Per Week/30 Days Discharge Instructions: Apply over primary dressing as directed. Compression Wrap: CoFlex TLC XL 2-layer Compression System 4x7 (in/yd) 1 x Per Week/30 Days Discharge Instructions: Apply CoFlex 2-layer compression as directed. (alt for 4 layer) Electronic Signature(s) Signed: 07/28/2021 5:36:21 PM By: Pam Najjar MD Signed: 07/28/2021 6:11:36 PM By: Zandra Abts RN, BSN Entered By: Zandra Abts on 07/28/2021 16:21:37 -------------------------------------------------------------------------------- Problem List Details Patient Name: Date of Service: Pam [name], Pam [name] 07/28/2021 2:45 PM Medical Record Number: [id number] Patient Account Number: [id number] Date of Birth/Sex: Treating RN: 1941/06/27 (80 y.o. Pam [name] Primary Care Provider: Deatra [name] Other Clinician: Referring Provider: Treating Provider/Extender: Pam [name] Weeks in Treatment: 10 Active Problems ICD-10 Encounter Code Description Active Date MDM Diagnosis I87.332 Chronic venous hypertension (idiopathic) with ulcer and inflammation of left 05/14/2021 No Yes lower extremity L97.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 05/14/2021 No Yes of skin Inactive Problems Resolved Problems Electronic Signature(s) Signed: 07/28/2021 5:36:21 PM By: Pam Najjar MD Entered By: Pam [name] on 07/28/2021  16:51:03 -------------------------------------------------------------------------------- Progress Note Details Patient Name: Date of Service: Pam [name], Pam [name] 07/28/2021 2:45 PM Medical Record Number: [id number] Patient Account Number: [id number] Date of Birth/Sex: Treating RN: 1941-01-14 (80 y.o. Pam [name] Primary Care Provider: Deatra [name] Other Clinician: Referring Provider: Treating Provider/Extender: Pam [name] Weeks in Treatment: 10 Subjective History of Present Illness (HPI) ADMISSION 05/14/2021 with This is a 80 year old reasonably healthy woman who is still working at [employer] and about to retire at the end of the month. She has had a problem with lower extremity edema for about the last 6 months she tells me. 2 months ago she noted small bumps on both of her legs however the left one medially morphed into a large blister. She was seen by her primary physician Dr. Wynelle [name] on 04/19/2021. Put on doxycycline. He noted at that time that the wound had been weeping for the last 5 weeks. The patient does not have a wound history. 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 We used Iodoflex under compression last week. 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 We have been using Iodoflex to clean up the wound surface. She came in with drainage. 6/24; wound on the left medial lower leg. Using Iodoflex to clean up the wound surface. We are making gradual progress with that. Nursing reported an odor and drainage. She is still working and has commercial [employer] but will transition to [employer] at  the end of July. Mentioning this because I would like to see about Apligraf 6/29; left medial lower leg using Iodoflex to date. Making gradual improvement with a nonviable surface. 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 Close to ordering Apligraf through her insuranceo Next week 7/6; still have not heard about Apligraf through her insurance. She is making  gradual improvement in the condition of this wound bed. She is retired and is now under [employer]. We have been using Hydrofera Blue under compression This is a fairly large wound. I have suspected that this was a venous insufficiency ulcer. As it is not really improving with standard therapy I have elected to go ahead and order venous reflux studies today 7/13; she is approved for Apligraf we will order that for next week. She continues to make decent improvements slightly smaller today surface looks better we have been using Hydrofera Blue under compression. She has her venous reflux studies next 7/20; Apligraf #1 07/14/2021 upon evaluation today patient appears to be doing excellent in regard to her wound. This is actually the first time I am seeing her. With that being said she does have good progress and improvement noted based on what I am seeing today and this is actually a progression #2 a for Apligraf. 8/17; improvement Apligraf #3. No concerns stated by the patient Objective Constitutional Sitting or standing Blood Pressure is within target range for patient.. Pulse regular and within target range for patient.Marland Kitchen Respirations regular, non-labored and within target range.. Temperature is normal and within the target range for the patient.Marland Kitchen Appears in no distress. Vitals Time Taken: 3:38 PM, Height: 64 in, Weight: 180 lbs, BMI: 30.9, Temperature: 97.8 Pam [name], Pulse: 73 bpm, Respiratory Rate: 16 breaths/min, Blood Pressure: 142/82 mmHg. General Notes: Wound exam; wound bed actually looks quite good. No requirement for  debridement. No evidence of surrounding infection Integumentary (Hair, Skin) Wound #1 status is Open. Original cause of wound was Gradually Appeared. The date acquired was: 03/14/2021. The wound has been in treatment 10 weeks. The wound is located on the Left,Medial Lower Leg. The wound measures 2.1cm length x 2.4cm width x 0.1cm depth; 3.958cm^2 area and 0.396cm^3 volume. There is Fat Layer (Subcutaneous Tissue) exposed. There is no tunneling or undermining noted. There is a medium amount of serosanguineous drainage noted. The wound margin is distinct with the outline attached to the wound base. There is large (67-100%) red, pink granulation within the wound bed. There is a small (1- 33%) amount of necrotic tissue within the wound bed including Adherent Slough. 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 A skin graft procedure using a bioengineered skin substitute/cellular or tissue based product was performed by Pam [name]., MD. Apligraf was applied and secured with Steri-Strips. 22 sq cm of product was utilized and 22 sq cm was wasted due to wound size. Post Application, gauze, abd, compression wrap was applied. The procedure was tolerated well with a pain level of 0 throughout and a pain level of 0 following the procedure. Post procedure Diagnosis Wound #1: Same as Pre-Procedure . Plan Follow-up Appointments: Return Appointment in 2 weeks. - with Dr. Leanord Hawking Nurse Visit: - 1 week for rewrap post apligraf Cellular or Tissue Based Products: Cellular or Tissue Based Product Type: - Apligraf #3 Bathing/ Shower/ Hygiene: May shower with protection but do not get wound dressing(s) wet. Edema Control -  Lymphedema / SCD / Other: Elevate legs to the level of the heart or above for 30 minutes daily and/or when sitting, a frequency of: - throughout the day Avoid standing for long periods of time. Exercise regularly Additional  Orders / Instructions: Follow Nutritious Diet WOUND #1: - Lower Leg Wound Laterality: Left, Medial Cleanser: Soap and Water 1 x Per Week/30 Days Discharge Instructions: May shower and wash wound with dial antibacterial soap and water prior to dressing change. Cleanser: Wound Cleanser 1 x Per Week/30 Days Discharge Instructions: Cleanse the wound with wound cleanser prior to applying a clean dressing using gauze sponges, not tissue or cotton balls. Peri-Wound Care: Triamcinolone 15 (g) 1 x Per Week/30 Days Discharge Instructions: to periwouind mixed with lotion Peri-Wound Care: Sween Lotion (Moisturizing lotion) 1 x Per Week/30 Days Discharge Instructions: Apply to periwound Prim Dressing: Apligraf #3 1 x Per Week/30 Days ary Secondary Dressing: Woven Gauze Sponge, Non-Sterile 4x4 in 1 x Per Week/30 Days Discharge Instructions: Apply over primary dressing as directed. Secondary Dressing: ABD Pad, 8x10 1 x Per Week/30 Days Discharge Instructions: Apply over primary dressing as directed. Com pression Wrap: CoFlex TLC XL 2-layer Compression System 4x7 (in/yd) 1 x Per Week/30 Days Discharge Instructions: Apply CoFlex 2-layer compression as directed. (alt for 4 layer) 1. Apligraf #3 in standard fashion 2. Coflex compression Electronic Signature(s) Signed: 07/28/2021 5:36:21 PM By: Pam Najjar MD Entered By: Pam [name] on 07/28/2021 16:53:16 -------------------------------------------------------------------------------- SuperBill Details Patient Name: Date of Service: Pam [name], Pam [name] 07/28/2021 Medical Record Number: [id number] Patient Account Number: [id number] Date of Birth/Sex: Treating RN: Jan 13, 1941 (80 y.o. Pam [name] Primary Care Provider: Deatra [name] Other Clinician: Referring Provider: Treating Provider/Extender: Pam [name] Weeks in Treatment: 10 Diagnosis Coding ICD-10 Codes Code Description [phone] Chronic venous hypertension (idiopathic) with ulcer and inflammation of left lower extremity L97.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 of skin Facility Procedures CPT4 Code: [id number] Description: (Facility Use Only) Apligraf 1 SQ CM Modifier: Quantity: 44 CPT4 Code: [id number] Description: 15271 - SKIN SUB GRAFT TRNK/ARM/LEG ICD-10 Diagnosis Description I87.332 Chronic venous hypertension (idiopathic) with ulcer and inflammation of left lo L97.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 Modifier: wer extremity of skin Quantity: 1 Physician Procedures : CPT4 Code Description Modifier [id number] - WC PHYS SKIN SUB GRAFT TRNK/ARM/LEG ICD-10 Diagnosis Description I87.332 Chronic venous hypertension (idiopathic) with ulcer and inflammation of left lower extremity L97.821 Non-pressure chronic ulcer of  other part of left lower leg limited to breakdown of skin Quantity: 1 Electronic Signature(s) Signed: 07/28/2021 6:11:36 PM By: Zandra Abts RN, BSN Signed: 07/29/2021 4:52:40 PM By: Pam Najjar MD Previous Signature: 07/28/2021 5:36:21 PM Version By: Pam Najjar MD Entered By: Zandra Abts on 07/28/2021 18:00:46

## 2021-08-04 ENCOUNTER — Other Ambulatory Visit: Payer: Self-pay

## 2021-08-04 ENCOUNTER — Encounter (HOSPITAL_BASED_OUTPATIENT_CLINIC_OR_DEPARTMENT_OTHER): Payer: Medicare PPO | Admitting: Internal Medicine

## 2021-08-04 DIAGNOSIS — I87332 Chronic venous hypertension (idiopathic) with ulcer and inflammation of left lower extremity: Secondary | ICD-10-CM | POA: Diagnosis not present

## 2021-08-04 NOTE — Progress Notes (Signed)
ALPHA, CHOUINARD ([id number]) Visit Report for 08/04/2021 Arrival Information Details Patient Name: Date of Service: Pam [name], Pam [name] 08/04/2021 1:45 PM Medical Record Number: [id number] Patient Account Number: [id number] Date of Birth/Sex: Treating RN: 1941-06-15 (80 y.o. [name] Primary Care Tereza Gilham: [name] Other Clinician: Referring Michiah Masse: Treating Abdishakur Gottschall/Extender: Angus Palms Weeks in Treatment: 11 Visit Information History Since Last Visit Added or deleted any medications: No Patient Arrived: Ambulatory Any new allergies or adverse reactions: No Arrival Time: 14:30 Had a fall or experienced change in No Accompanied By: alone activities of daily living that may affect Transfer Assistance: None risk of falls: Patient Identification Verified: Yes Signs or symptoms of abuse/neglect since last visito No Secondary Verification Process Completed: Yes Hospitalized since last visit: No Patient Requires Transmission-Based Precautions: No Implantable device outside of the clinic excluding No Patient Has Alerts: No cellular tissue based products placed in the center since last visit: Has Dressing in Place as Prescribed: Yes Has Compression in Place as Prescribed: Yes Pain Present Now: No Electronic Signature(s) Signed: 08/04/2021 5:20:27 PM By: Zandra Abts RN, BSN Entered By: Zandra Abts on 08/04/2021 16:04:21 -------------------------------------------------------------------------------- Compression Therapy Details Patient Name: Date of Service: Pam [name], Pam [name] 08/04/2021 1:45 PM Medical Record Number: [id number] Patient Account Number: [id number] Date of Birth/Sex: Treating RN: August 24, 1941 (80 y.o. [name] Primary Care Leone Putman: [name] Other Clinician: Referring Raegan Winders: Treating Teghan Philbin/Extender: Angus Palms Weeks in Treatment: 11 Compression Therapy Performed for Wound Assessment: Wound #1 Left,Medial Lower  Leg Performed By: Clinician Zandra Abts, RN Compression Type: Double Layer Electronic Signature(s) Signed: 08/04/2021 5:20:27 PM By: Zandra Abts RN, BSN Entered By: Zandra Abts on 08/04/2021 16:18:01 -------------------------------------------------------------------------------- Encounter Discharge Information Details Patient Name: Date of Service: MERRICK, FEUTZ 08/04/2021 1:45 PM Medical Record Number: [id number] Patient Account Number: [id number] Date of Birth/Sex: Treating RN: 10-30-1941 (80 y.o. [name] Primary Care Angelys Yetman: [name] Other Clinician: Referring Aspen Lawrance: Treating Saara Kijowski/Extender: Angus Palms Weeks in Treatment: 11 Encounter Discharge Information Items Discharge Condition: Stable Ambulatory Status: Ambulatory Discharge Destination: Home Transportation: Private Auto Accompanied By: alone Schedule Follow-up Appointment: Yes Clinical Summary of Care: Patient Declined Electronic Signature(s) Signed: 08/04/2021 5:20:27 PM By: Zandra Abts RN, BSN Entered By: Zandra Abts on 08/04/2021 16:20:40 -------------------------------------------------------------------------------- Wound Assessment Details Patient Name: Date of Service: Pam [name], Pam [name] 08/04/2021 1:45 PM Medical Record Number: [id number] Patient Account Number: [id number] Date of Birth/Sex: Treating RN: December 10, 1941 (80 y.o. [name] Primary Care Kaylie Ritter: [name] Other Clinician: Referring Lonie Rummell: Treating Rockford Leinen/Extender: Angus Palms Weeks in Treatment: 11 Wound Status Wound Number: 1 Primary Etiology: Venous Leg Ulcer Wound Location: Left, Medial Lower Leg Wound Status: Open Wounding Event: Gradually Appeared Comorbid History: Cataracts, Glaucoma, Hypertension, Osteoarthritis Date Acquired: 03/14/2021 Weeks Of Treatment: 11 Clustered Wound: No Wound Measurements Length: (cm) 2.1 Width: (cm) 2.4 Depth: (cm)  0.1 Area: (cm) 3.958 Volume: (cm) 0.396 % Reduction in Area: 91.4% % Reduction in Volume: 91.4% Epithelialization: Small (1-33%) Tunneling: No Undermining: No Wound Description Classification: Full Thickness With Exposed Support Structures Wound Margin: Distinct, outline attached Exudate Amount: Medium Exudate Type: Serosanguineous Exudate Color: red, brown Foul Odor After Cleansing: No Slough/Fibrino Yes Wound Bed Granulation Amount: Large (67-100%) Exposed Structure Granulation Quality: Red, Pink Fascia Exposed: No Necrotic Amount: Small (1-33%) Fat Layer (Subcutaneous Tissue) Exposed: Yes Necrotic Quality: Adherent Slough Tendon Exposed: No Muscle Exposed: No Joint Exposed: No Bone Exposed: No Treatment Notes Wound #1 (Lower Leg) Wound Laterality: Left, Medial Cleanser Soap  and Water Discharge Instruction: May shower and wash wound with dial antibacterial soap and water prior to dressing change. Wound Cleanser Discharge Instruction: Cleanse the wound with wound cleanser prior to applying a clean dressing using gauze sponges, not tissue or cotton balls. Peri-Wound Care Triamcinolone 15 (g) Discharge Instruction: to periwouind mixed with lotion Sween Lotion (Moisturizing lotion) Discharge Instruction: Apply to periwound Topical Primary Dressing Apligraf #3 Secondary Dressing Woven Gauze Sponge, Non-Sterile 4x4 in Discharge Instruction: Apply over primary dressing as directed. ABD Pad, 8x10 Discharge Instruction: Apply over primary dressing as directed. Secured With Compression Wrap CoFlex TLC XL 2-layer Compression System 4x7 (in/yd) Discharge Instruction: Apply CoFlex 2-layer compression as directed. (alt for 4 layer) Compression Stockings Add-Ons Electronic Signature(s) Signed: 08/04/2021 5:20:27 PM By: Zandra Abts RN, BSN Entered By: Zandra Abts on 08/04/2021  16:09:06 -------------------------------------------------------------------------------- Vitals Details Patient Name: Date of Service: Pam [name], Pam [name] 08/04/2021 1:45 PM Medical Record Number: [id number] Patient Account Number: [id number] Date of Birth/Sex: Treating RN: 12/07/1941 (80 y.o. [name] Primary Care Jamarian Jacinto: [name] Other Clinician: Referring Harace Mccluney: Treating Tery Hoeger/Extender: Angus Palms Weeks in Treatment: 11 Vital Signs Time Taken: 14:30 Temperature (F): 97.8 Height (in): 64 Pulse (bpm): 75 Weight (lbs): 180 Respiratory Rate (breaths/min): 16 Body Mass Index (BMI): 30.9 Blood Pressure (mmHg): 144/90 Reference Range: 80 - 120 mg / dl Electronic Signature(s) Signed: 08/04/2021 5:20:27 PM By: Zandra Abts RN, BSN Entered By: Zandra Abts on 08/04/2021 16:08:37

## 2021-08-06 NOTE — Progress Notes (Signed)
WAVA, KILDOW ([id number]) Visit Report for 08/04/2021 SuperBill Details Patient Name: Date of Service: ALLEY, NEILS 08/04/2021 Medical Record Number: [id number] Patient Account Number: [id number] Date of Birth/Sex: Treating RN: 1941-07-18 (80 y.o. [name] Primary Care Provider: [name] Other Clinician: Referring Provider: Treating Provider/Extender: Angus Palms Weeks in Treatment: 11 Diagnosis Coding ICD-10 Codes Code Description [phone] Chronic venous hypertension (idiopathic) with ulcer and inflammation of left lower extremity L97.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 of skin Facility Procedures CPT4 Code Description Modifier Quantity [id number] (Facility Use Only) [phone] - APPLY MULTLAY COMPRS LWR LT LEG 1 Electronic Signature(s) Signed: 08/04/2021 5:20:27 PM By: Zandra Abts RN, BSN Signed: 08/06/2021 7:36:59 AM By: Baltazar Najjar MD Entered By: Zandra Abts on 08/04/2021 16:20:52

## 2021-08-11 ENCOUNTER — Other Ambulatory Visit: Payer: Self-pay

## 2021-08-11 ENCOUNTER — Encounter (HOSPITAL_BASED_OUTPATIENT_CLINIC_OR_DEPARTMENT_OTHER): Payer: Medicare PPO | Admitting: Internal Medicine

## 2021-08-11 DIAGNOSIS — I87332 Chronic venous hypertension (idiopathic) with ulcer and inflammation of left lower extremity: Secondary | ICD-10-CM | POA: Diagnosis not present

## 2021-08-12 NOTE — Progress Notes (Signed)
Pam [name], Pam [name] ([id number]) Visit Report for 08/11/2021 Arrival Information Details Patient Name: Date of Service: Pam [name], Pam [name] 08/11/2021 1:30 PM Medical Record Number: [id number] Patient Account Number: [id number] Date of Birth/Sex: Treating RN: 06-21-1941 (80 y.o. Pam [name] Primary Care Catelin Manthe: Donald Prose Other Clinician: Referring Detria Cummings: Treating Aarian Griffie/Extender: Charyl Dancer Weeks in Treatment: 12 Visit Information History Since Last Visit Added or deleted any medications: No Patient Arrived: Ambulatory Any new allergies or adverse reactions: No Arrival Time: 13:42 Had a fall or experienced change in No Accompanied By: self activities of daily living that may affect Transfer Assistance: None risk of falls: Patient Identification Verified: Yes Signs or symptoms of abuse/neglect since last visito No Secondary Verification Process Completed: Yes Hospitalized since last visit: No Patient Requires Transmission-Based Precautions: No Implantable device outside of the clinic excluding No Patient Has Alerts: No cellular tissue based products placed in the center since last visit: Has Dressing in Place as Prescribed: Yes Pain Present Now: No Electronic Signature(s) Signed: 08/11/2021 3:05:52 PM By: Sandre Kitty Entered By: Sandre Kitty on 08/11/2021 13:43:31 -------------------------------------------------------------------------------- Compression Therapy Details Patient Name: Date of Service: Pam [name] 08/11/2021 1:30 PM Medical Record Number: [id number] Patient Account Number: [id number] Date of Birth/Sex: Treating RN: 1941-04-03 (80 y.o. Pam [name], Pam [name] Primary Care Loucille Takach: Donald Prose Other Clinician: Referring Lashone Stauber: Treating Teona Vargus/Extender: Charyl Dancer Weeks in Treatment: 12 Compression Therapy Performed for Wound Assessment: Wound #1 Left,Medial Lower Leg Performed By: Clinician Rhae Hammock, RN Compression Type: Four Layer Post Procedure Diagnosis Same as Pre-procedure Electronic Signature(s) Signed: 08/12/2021 4:23:50 PM By: Rhae Hammock RN Entered By: Rhae Hammock on 08/11/2021 14:52:27 -------------------------------------------------------------------------------- Encounter Discharge Information Details Patient Name: Date of Service: Pam [name], Pam [name] Pam [name] 08/11/2021 1:30 PM Medical Record Number: [id number] Patient Account Number: [id number] Date of Birth/Sex: Treating RN: May 18, 1941 (80 y.o. Pam [name], Pam [name] Primary Care Zniyah Midkiff: Donald Prose Other Clinician: Referring Kailah Pennel: Treating Chardai Gangemi/Extender: Charyl Dancer Weeks in Treatment: 12 Encounter Discharge Information Items Post Procedure Vitals Discharge Condition: Stable Temperature (F): 98.7 Ambulatory Status: Ambulatory Pulse (bpm): 74 Discharge Destination: Home Respiratory Rate (breaths/min): 17 Transportation: Private Auto Blood Pressure (mmHg): 134/74 Accompanied By: self Schedule Follow-up Appointment: Yes Clinical Summary of Care: Electronic Signature(s) Signed: 08/12/2021 4:23:50 PM By: Rhae Hammock RN Entered By: Rhae Hammock on 08/11/2021 15:03:34 -------------------------------------------------------------------------------- Lower Extremity Assessment Details Patient Name: Date of Service: Pam [name], Pam [name] 08/11/2021 1:30 PM Medical Record Number: [id number] Patient Account Number: [id number] Date of Birth/Sex: Treating RN: 10/25/1941 (80 y.o. Pam [name], Pam [name] Primary Care Kenly Henckel: Donald Prose Other Clinician: Referring Veida Spira: Treating Marvell Tamer/Extender: Charyl Dancer Weeks in Treatment: 12 Edema Assessment Assessed: [Left: No] Patrice Paradise: No] Edema: [Left: Ye] [Right: s] Calf Left: Right: Point of Measurement: 35 cm From Medial Instep 31 cm Ankle Left: Right: Point of Measurement: 9 cm From Medial Instep 21 cm Electronic  Signature(s) Signed: 08/12/2021 4:23:50 PM By: Rhae Hammock RN Entered By: Rhae Hammock on 08/11/2021 14:10:51 -------------------------------------------------------------------------------- Multi Wound Chart Details Patient Name: Date of Service: Pam [name], Pam [name] 08/11/2021 1:30 PM Medical Record Number: [id number] Patient Account Number: [id number] Date of Birth/Sex: Treating RN: 1941-06-21 (80 y.o. Pam [name] Primary Care Rachelle Edwards: Donald Prose Other Clinician: Referring Kerington Hildebrant: Treating Davaughn Hillyard/Extender: Charyl Dancer Weeks in Treatment: 12 Vital Signs Height(in): 64 Pulse(bpm): 37 Weight(lbs): 180 Blood Pressure(mmHg): 139/75 Body Mass Index(BMI): 31 Temperature(F): 97.8 Respiratory Rate(breaths/min): 16 Photos: [N/A:N/A] Left, Medial Lower Leg N/A N/A Wound Location: Gradually Appeared N/A N/A Wounding Event: Venous  Leg Ulcer N/A N/A Primary Etiology: Cataracts, Glaucoma, Hypertension, N/A N/A Comorbid History: Osteoarthritis 03/14/2021 N/A N/A Date Acquired: 12 N/A N/A Weeks of Treatment: Open N/A N/A Wound Status: 1x1.2x0.1 N/A N/A Measurements L x W x D (cm) 0.942 N/A N/A A (cm) : rea 0.094 N/A N/A Volume (cm) : 97.90% N/A N/A % Reduction in Area: 98.00% N/A N/A % Reduction in Volume: Full Thickness With Exposed Support N/A N/A Classification: Structures Medium N/A N/A Exudate A mount: Serosanguineous N/A N/A Exudate Type: red, brown N/A N/A Exudate Color: Distinct, outline attached N/A N/A Wound Margin: Large (67-100%) N/A N/A Granulation Amount: Red, Pink N/A N/A Granulation Quality: Small (1-33%) N/A N/A Necrotic Amount: Eschar, Adherent Slough N/A N/A Necrotic Tissue: Fat Layer (Subcutaneous Tissue): Yes N/A N/A Exposed Structures: Fascia: No Tendon: No Muscle: No Joint: No Bone: No Small (1-33%) N/A N/A Epithelialization: Cellular or Tissue Based Product N/A N/A Procedures  Performed: Treatment Notes Electronic Signature(s) Signed: 08/11/2021 4:28:25 PM By: Robson, Michael MD Signed: 08/12/2021 5:19:32 PM By: [name], [name] RN, BSN Entered By: Robson, Michael on 08/11/2021 14:44:16 -------------------------------------------------------------------------------- Multi-Disciplinary Care Plan Details Patient Name: Date of Service: Pam [name], Pam [name] 08/11/2021 1:30 PM Medical Record Number: [id number] Patient Account Number: [id number] Date of Birth/Sex: Treating RN: 01/13/1941 (80 y.o. F) Breedlove, Pam [name] Primary Care Provider: Sun, [name] Other Clinician: Referring Provider: Treating Provider/Extender: Robson, Michael Sun, [name] Weeks in Treatment: 12 Active Inactive Venous Leg Ulcer Nursing Diagnoses: Actual venous Insuffiency (use after diagnosis is confirmed) Goals: Patient will maintain optimal edema control Date Initiated: 05/14/2021 Target Resolution Date: 09/02/2021 Goal Status: Active Patient/caregiver will verbalize understanding of disease process and disease management Date Initiated: 05/14/2021 Date Inactivated: 06/09/2021 Target Resolution Date: 06/11/2021 Goal Status: Met Interventions: Assess peripheral edema status every visit. Compression as ordered Treatment Activities: Therapeutic compression applied : 05/14/2021 Notes: Wound/Skin Impairment Nursing Diagnoses: Impaired tissue integrity Goals: Patient/caregiver will verbalize understanding of skin care regimen Date Initiated: 05/14/2021 Target Resolution Date: 09/02/2021 Goal Status: Active Ulcer/skin breakdown will have a volume reduction of 30% by week 4 Date Initiated: 05/14/2021 Date Inactivated: 06/16/2021 Target Resolution Date: 06/18/2021 Goal Status: Met Interventions: Assess patient/caregiver ability to obtain necessary supplies Assess patient/caregiver ability to perform ulcer/skin care regimen upon admission and as needed Assess ulceration(s) every visit Provide education on  ulcer and skin care Treatment Activities: Skin care regimen initiated : 05/14/2021 Topical wound management initiated : 05/14/2021 Notes: Electronic Signature(s) Signed: 08/12/2021 4:23:50 PM By: Breedlove, Lauren RN Entered By: Breedlove, Pam [name] on 08/11/2021 14:28:25 -------------------------------------------------------------------------------- Pain Assessment Details Patient Name: Date of Service: Pam [name], Pam [name] 08/11/2021 1:30 PM Medical Record Number: [id number] Patient Account Number: [id number] Date of Birth/Sex: Treating RN: 05/07/1941 (80 y.o. F) [name], [name] Primary Care Provider: Sun, [name] Other Clinician: Referring Provider: Treating Provider/Extender: Robson, Michael Sun, [name] Weeks in Treatment: 12 Active Problems Location of Pain Severity and Description of Pain Patient Has Paino No Site Locations Pain Management and Medication Current Pain Management: Electronic Signature(s) Signed: 08/11/2021 3:05:52 PM By: Dawkins, Destiny Signed: 08/12/2021 5:19:32 PM By: [name], [name] RN, BSN Entered By: Dawkins, Destiny on 08/11/2021 13:45:43 -------------------------------------------------------------------------------- Patient/Caregiver Education Details Patient Name: Date of Service: Pam [name], Pam [name] 8/31/2022andnbsp1:30 PM Medical Record Number: [id number] Patient Account Number: [id number] Date of Birth/Gender: Treating RN: 12/12/1940 (80 y.o. F) Breedlove, Pam [name] Primary Care Physician: Sun, [name] Other Clinician: Referring Physician: Treating Physician/Extender: Robson, Michael Sun, [name] Weeks in Treatment: 12 Education Assessment Education Provided To: Patient Education Topics Provided Wound/Skin Impairment: Methods: Explain/Verbal Electronic Signature(s) Signed: 08/12/2021   4:23:50 PM By: Rhae Hammock RN Entered By: Rhae Hammock on 08/11/2021 14:28:40 -------------------------------------------------------------------------------- Wound  Assessment Details Patient Name: Date of Service: Pam [name], Pam [name] 08/11/2021 1:30 PM Medical Record Number: [id number] Patient Account Number: [id number] Date of Birth/Sex: Treating RN: 22-Jun-1941 (80 y.o. Pam [name], Pam [name] Primary Care Jericho Alcorn: Donald Prose Other Clinician: Referring Josua Ferrebee: Treating Zakyia Gagan/Extender: Charyl Dancer Weeks in Treatment: 12 Wound Status Wound Number: 1 Primary Etiology: Venous Leg Ulcer Wound Location: Left, Medial Lower Leg Wound Status: Open Wounding Event: Gradually Appeared Comorbid History: Cataracts, Glaucoma, Hypertension, Osteoarthritis Date Acquired: 03/14/2021 Weeks Of Treatment: 12 Clustered Wound: No Photos Wound Measurements Length: (cm) 1 Width: (cm) 1.2 Depth: (cm) 0.1 Area: (cm) 0.942 Volume: (cm) 0.094 % Reduction in Area: 97.9% % Reduction in Volume: 98% Epithelialization: Small (1-33%) Tunneling: No Undermining: No Wound Description Classification: Full Thickness With Exposed Support Structures Wound Margin: Distinct, outline attached Exudate Amount: Medium Exudate Type: Serosanguineous Exudate Color: red, brown Foul Odor After Cleansing: No Slough/Fibrino Yes Wound Bed Granulation Amount: Large (67-100%) Exposed Structure Granulation Quality: Red, Pink Fascia Exposed: No Necrotic Amount: Small (1-33%) Fat Layer (Subcutaneous Tissue) Exposed: Yes Necrotic Quality: Eschar, Adherent Slough Tendon Exposed: No Muscle Exposed: No Joint Exposed: No Bone Exposed: No Treatment Notes Wound #1 (Lower Leg) Wound Laterality: Left, Medial Cleanser Soap and Water Discharge Instruction: May shower and wash wound with dial antibacterial soap and water prior to dressing change. Wound Cleanser Discharge Instruction: Cleanse the wound with wound cleanser prior to applying a clean dressing using gauze sponges, not tissue or cotton balls. Peri-Wound Care Triamcinolone 15 (g) Discharge Instruction: to  periwouind mixed with lotion Sween Lotion (Moisturizing lotion) Discharge Instruction: Apply to periwound Topical Primary Dressing Apligraf #4 Secondary Dressing Woven Gauze Sponge, Non-Sterile 4x4 in Discharge Instruction: Apply over primary dressing as directed. ABD Pad, 8x10 Discharge Instruction: Apply over primary dressing as directed. Secured With Compression Wrap CoFlex TLC XL 2-layer Compression System 4x7 (in/yd) Discharge Instruction: Apply CoFlex 2-layer compression as directed. (alt for 4 layer) Compression Stockings Add-Ons Electronic Signature(s) Signed: 08/12/2021 4:23:50 PM By: Rhae Hammock RN Entered By: Rhae Hammock on 08/11/2021 14:24:24 -------------------------------------------------------------------------------- Vitals Details Patient Name: Date of Service: Pam [name], Pam [name] 08/11/2021 1:30 PM Medical Record Number: [id number] Patient Account Number: [id number] Date of Birth/Sex: Treating RN: Dec 01, 1941 (80 y.o. Pam [name] Primary Care Lurae Hornbrook: Donald Prose Other Clinician: Referring Keyara Ent: Treating Calix Heinbaugh/Extender: Charyl Dancer Weeks in Treatment: 12 Vital Signs Time Taken: 13:45 Temperature (F): 97.8 Height (in): 64 Pulse (bpm): 77 Weight (lbs): 180 Respiratory Rate (breaths/min): 16 Body Mass Index (BMI): 30.9 Blood Pressure (mmHg): 139/75 Reference Range: 80 - 120 mg / dl Electronic Signature(s) Signed: 08/11/2021 3:05:52 PM By: Sandre Kitty Entered By: Sandre Kitty on 08/11/2021 13:45:39

## 2021-08-12 NOTE — Progress Notes (Signed)
Lorin GlassMILLER, Etheleen ([id number]) Visit Report for 08/11/2021 Cellular or Tissue Based Product Details Patient Name: Date of Service: [name], [name] 08/11/2021 1:30 PM Medical Record Number: [id number] Patient Account Number: [id number] Date of Birth/Sex: Treating RN: 1941-09-05 (80 y.o. Ardis RowanF) Breedlove, Lauren Primary Care Provider: Deatra JamesSun, [name] Other Clinician: Referring Provider: Treating Provider/Extender: Angus Palmsobson, Elianis Fischbach Sun, [name] Weeks in Treatment: 12 Cellular or Tissue Based Product Type Wound #1 Left,Medial Lower Leg Applied to: Performed By: Physician Maxwell Caulobson, Brettany Sydney G., [name] Level of Consciousness (Pre-procedure): Awake and Alert Pre-procedure Verification/Time Out Yes - 14:25 Taken: Location: trunk / arms / legs Wound Size (sq cm): 1.2 Product Size (sq cm): 44 Waste Size (sq cm): 22 Waste Reason: wounds Amount of Product Applied (sq cm): 22 Instrument Used: Blade, Forceps, Scissors Lot #: [id number] Expiration Date: 08/12/2021 Fenestrated: Yes Instrument: Blade Reconstituted: Yes Solution Type: NS Solution Amount: 10MKL Lot #: [id number] Solution Expiration Date: 08/12/2022 Secured: Yes Secured With: Steri-Strips Dressing Applied: Yes Primary Dressing: adaptic Procedural Pain: 0 Post Procedural Pain: 0 Response to Treatment: Procedure was tolerated well Level of Consciousness (Post- Awake and Alert procedure): Post Procedure Diagnosis Same as Pre-procedure Electronic Signature(s) Signed: 08/11/2021 4:28:25 PM By: [name], Hani Patnode MD Signed: 08/12/2021 4:23:50 PM By: Fonnie MuBreedlove, Lauren RN Entered By: Fonnie MuBreedlove, Lauren on 08/11/2021 14:52:01 -------------------------------------------------------------------------------- HPI Details Patient Name: Date of Service: Edman CircleMILLER, [name] 08/11/2021 1:30 PM Medical Record Number: [id number] Patient Account Number: [id number] Date of Birth/Sex: Treating RN: 1941-09-05 (80 y.o. Wynelle LinkF) [name],  [name] Primary Care Provider: Deatra JamesSun, [name] Other Clinician: Referring Provider: Treating Provider/Extender: Angus Palmsobson, Barak Bialecki Sun, [name] Weeks in Treatment: 12 History of Present Illness HPI Description: ADMISSION 05/14/2021 with This is a 80 year old reasonably healthy woman who is still working at [employer] and about to retire at the end of the month. She has had a problem with lower extremity edema for about the last 6 months she tells me. 2 months ago she noted small bumps on both of her legs however the left one medially morphed into a large blister. She was seen by her primary physician Dr. Wynelle LinkSun on 04/19/2021. Put on doxycycline. He noted at that time that the wound had been weeping for the last 5 weeks. The patient does not have a wound history. 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 We used Iodoflex under compression last week. 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 We have been using Iodoflex to clean up the wound surface. She came in with drainage. 6/24; wound on the left medial lower leg. Using Iodoflex to clean up the wound surface. We are making gradual progress with that. Nursing reported an odor and drainage. 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 Mentioning this because I would like to see about Apligraf 6/29; left medial lower leg using Iodoflex to date. Making gradual improvement with a nonviable surface. 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 Close to ordering Apligraf through her insuranceo Next  week 7/6; still have not heard about Apligraf through her insurance. She  is making gradual improvement in the condition of this wound bed. She is retired and is now under [employer]. We have been using Hydrofera Blue under compression This is a fairly large wound. I have suspected that this was a venous insufficiency ulcer. As it is not really improving with standard therapy I have elected to go ahead and order venous reflux studies today 7/13; she is approved for Apligraf we will order that for next week. She continues to make decent improvements slightly smaller today surface looks better we have been using Hydrofera Blue under compression. She has her venous reflux studies next 7/20; Apligraf #1 07/14/2021 upon evaluation today patient appears to be doing excellent in regard to her wound. This is actually the first time I am seeing her. With that being said she does have good progress and improvement noted based on what I am seeing today and this is actually a progression #2 a for Apligraf. 8/17; improvement Apligraf #3. No concerns stated by the patient 8/31 Apligraf #4 very superficial now healthy granulation and smaller dimension Electronic Signature(s) Signed: 08/11/2021 4:28:25 PM By: Baltazar Najjar MD Entered By: Baltazar Najjar on 08/11/2021 14:44:50 -------------------------------------------------------------------------------- Physical Exam Details Patient Name: Date of Service: JOCABED, CHEESE 08/11/2021 1:30 PM Medical Record Number: [id number] Patient Account Number: [id number] Date of Birth/Sex: Treating RN: [birth year] (80 y.o. [name] Primary Care Provider: [name] Other Clinician: Referring Provider: Treating Provider/Extender: Angus Palms Weeks in Treatment: 12 Constitutional Sitting or standing Blood Pressure is within target range for patient.. Pulse regular and within target range for patient.Marland Kitchen Respirations regular, non-labored and within  target range.. Temperature is normal and within the target range for the patient.Marland Kitchen Appears in no distress. Notes Wound exam; wound surface looks healthy. No need for debridement there is no surrounding infection. Nice rim of epithelialization. Apligraf #4 applied in the standard fashion Electronic Signature(s) Signed: 08/11/2021 4:28:25 PM By: Baltazar Najjar MD Entered By: Baltazar Najjar on 08/11/2021 14:45:47 -------------------------------------------------------------------------------- Physician Orders Details Patient Name: Date of Service: LIADAN, GUIZAR NELL 08/11/2021 1:30 PM Medical Record Number: [id number] Patient Account Number: [id number] Date of Birth/Sex: Treating RN: 29-Sep-1941 (80 y.o. Toniann Fail Primary Care Provider: [name] Other Clinician: Referring Provider: Treating Provider/Extender: Angus Palms Weeks in Treatment: 12 Verbal / Phone Orders: No Diagnosis Coding Follow-up Appointments ppointment in 2 weeks. - with Dr. Leanord Hawking Return A Nurse Visit: - 1 week for rewrap post apligraf Cellular or Tissue Based Products Cellular or Tissue Based Product Type: - Apligraf #4 Bathing/ Shower/ Hygiene May shower with protection but do not get wound dressing(s) wet. Edema Control - Lymphedema / SCD / Other Elevate legs to the level of the heart or above for 30 minutes daily and/or when sitting, a frequency of: - throughout the day Avoid standing for long periods of time. Exercise regularly Additional Orders / Instructions Follow Nutritious Diet Wound Treatment Wound #1 - Lower Leg Wound Laterality: Left, Medial Cleanser: Soap and Water 1 x Per Week/30 Days Discharge Instructions: May shower and wash wound with dial antibacterial soap and water prior to dressing change. Cleanser: Wound Cleanser 1 x Per Week/30 Days Discharge Instructions: Cleanse the wound with wound cleanser prior to applying a clean dressing using gauze sponges, not tissue  or cotton balls. Peri-Wound Care: Triamcinolone 15 (g) 1 x Per Week/30 Days Discharge Instructions: to periwouind mixed with lotion Peri-Wound Care: Sween Lotion (Moisturizing lotion) 1 x Per Week/30 Days Discharge Instructions: Apply to periwound Prim Dressing: Apligraf #  4 ary 1 x Per Week/30 Days Secondary Dressing: Woven Gauze Sponge, Non-Sterile 4x4 in 1 x Per Week/30 Days Discharge Instructions: Apply over primary dressing as directed. Secondary Dressing: ABD Pad, 8x10 1 x Per Week/30 Days Discharge Instructions: Apply over primary dressing as directed. Compression Wrap: CoFlex TLC XL 2-layer Compression System 4x7 (in/yd) 1 x Per Week/30 Days Discharge Instructions: Apply CoFlex 2-layer compression as directed. (alt for 4 layer) Electronic Signature(s) Signed: 08/11/2021 4:28:25 PM By: Baltazar Najjar MD Signed: 08/12/2021 4:23:50 PM By: Fonnie Mu RN Entered By: Fonnie Mu on 08/11/2021 14:28:08 -------------------------------------------------------------------------------- Problem List Details Patient Name: Date of Service: ELVENA, OYER 08/11/2021 1:30 PM Medical Record Number: [id number] Patient Account Number: [id number] Date of Birth/Sex: Treating RN: 1941-11-09 (80 y.o. [name] Primary Care Provider: [name] Other Clinician: Referring Provider: Treating Provider/Extender: Angus Palms Weeks in Treatment: 12 Active Problems ICD-10 Encounter Code Description Active Date MDM Diagnosis I87.332 Chronic venous hypertension (idiopathic) with ulcer and inflammation of left 05/14/2021 No Yes lower extremity L97.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 05/14/2021 No Yes of skin Inactive Problems Resolved Problems Electronic Signature(s) Signed: 08/11/2021 4:28:25 PM By: Baltazar Najjar MD Entered By: Baltazar Najjar on 08/11/2021  14:44:11 -------------------------------------------------------------------------------- Progress Note Details Patient Name: Date of Service: MIYONNA, ORMISTON NELL 08/11/2021 1:30 PM Medical Record Number: [id number] Patient Account Number: [id number] Date of Birth/Sex: Treating RN: 1941-05-12 (80 y.o. [name] Primary Care Provider: [name] Other Clinician: Referring Provider: Treating Provider/Extender: Angus Palms Weeks in Treatment: 12 Subjective History of Present Illness (HPI) ADMISSION 05/14/2021 with This is a 80 year old reasonably healthy woman who is still working at [employer] and about to retire at the end of the month. She has had a problem with lower extremity edema for about the last 6 months she tells me. 2 months ago she noted small bumps on both of her legs however the left one medially morphed into a large blister. She was seen by her primary physician Dr. [name] on 04/19/2021. Put on doxycycline. He noted at that time that the wound had been weeping for the last 5 weeks. The patient does not have a wound history. 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 We used Iodoflex under compression last week. 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 We have been using Iodoflex to clean up the wound surface. She came in with drainage. 6/24; wound on the left medial lower leg. Using Iodoflex to clean up the wound surface. We are making gradual progress with that. Nursing reported an odor and drainage. She is still working and has commercial [employer] but will transition to [employer] at  the end of July. Mentioning this because I would like to see about Apligraf 6/29; left medial lower leg using Iodoflex to date. Making gradual improvement with a nonviable surface. 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 Close to ordering Apligraf through her insuranceo Next week 7/6; still have not heard about Apligraf through her insurance. She is making gradual improvement in the condition of this wound  bed. She is retired and is now under [employer]. We have been using Hydrofera Blue under compression This is a fairly large wound. I have suspected that this was a venous insufficiency ulcer. As it is not really improving with standard therapy I have elected to go ahead and order venous reflux studies today 7/13; she is approved for Apligraf we will order that for next week. She continues to make decent improvements slightly smaller today surface looks better we have been using Hydrofera Blue under compression. She has her venous reflux studies next 7/20; Apligraf #1 07/14/2021 upon evaluation today patient appears to be doing excellent in regard to her wound. This is actually the first time I am seeing her. With that being said she does have good progress and improvement noted based on what I am seeing today and this is actually a progression #2 a for Apligraf. 8/17; improvement Apligraf #3. No concerns stated by the patient 8/31 Apligraf #4 very superficial now healthy granulation and smaller dimension Objective Constitutional Sitting or standing Blood Pressure is within target range for patient.. Pulse regular and within target range for patient.Marland Kitchen Respirations regular, non-labored and within target range.. Temperature is normal and within the target range for the patient.Marland Kitchen Appears in no distress. Vitals Time Taken: 1:45 PM, Height: 64 in, Weight: 180 lbs, BMI: 30.9, Temperature: 97.8 F, Pulse: 77 bpm, Respiratory Rate: 16 breaths/min, Blood Pressure: 139/75  mmHg. General Notes: Wound exam; wound surface looks healthy. No need for debridement there is no surrounding infection. Nice rim of epithelialization. Apligraf #4 applied in the standard fashion Integumentary (Hair, Skin) Wound #1 status is Open. Original cause of wound was Gradually Appeared. The date acquired was: 03/14/2021. The wound has been in treatment 12 weeks. The wound is located on the Left,Medial Lower Leg. The wound measures 1cm length x 1.2cm width x 0.1cm depth; 0.942cm^2 area and 0.094cm^3 volume. There is Fat Layer (Subcutaneous Tissue) exposed. There is no tunneling or undermining noted. There is a medium amount of serosanguineous drainage noted. The wound margin is distinct with the outline attached to the wound base. There is large (67-100%) red, pink granulation within the wound bed. There is a small (1- 33%) amount of necrotic tissue within the wound bed including Eschar and Adherent Slough. 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 A skin graft procedure using a bioengineered skin substitute/cellular or tissue based product was performed by Maxwell Caul., MD with the following instrument(s): Blade, Forceps, and Scissors. Apligraf was applied and secured with Steri-Strips. 22 sq cm of product was utilized and 22 sq cm was wasted. Post Application, adaptic was applied. A Time Out was conducted at 14:25, prior to the start of the procedure. The procedure was tolerated well with a pain level of 0 throughout and a pain level of 0 following the procedure. Post procedure Diagnosis Wound #1: Same as Pre-Procedure . Plan Follow-up Appointments: Return Appointment in 2 weeks. - with Dr.  Leanord Hawking Nurse Visit: - 1 week for rewrap post apligraf Cellular or Tissue Based Products: Cellular or Tissue Based Product Type: - Apligraf #4 Bathing/ Shower/ Hygiene: May shower with protection but do not get wound dressing(s) wet. Edema Control - Lymphedema / SCD / Other: Elevate legs to the level  of the heart or above for 30 minutes daily and/or when sitting, a frequency of: - throughout the day Avoid standing for long periods of time. Exercise regularly Additional Orders / Instructions: Follow Nutritious Diet WOUND #1: - Lower Leg Wound Laterality: Left, Medial Cleanser: Soap and Water 1 x Per Week/30 Days Discharge Instructions: May shower and wash wound with dial antibacterial soap and water prior to dressing change. Cleanser: Wound Cleanser 1 x Per Week/30 Days Discharge Instructions: Cleanse the wound with wound cleanser prior to applying a clean dressing using gauze sponges, not tissue or cotton balls. Peri-Wound Care: Triamcinolone 15 (g) 1 x Per Week/30 Days Discharge Instructions: to periwouind mixed with lotion Peri-Wound Care: Sween Lotion (Moisturizing lotion) 1 x Per Week/30 Days Discharge Instructions: Apply to periwound Prim Dressing: Apligraf #4 1 x Per Week/30 Days ary Secondary Dressing: Woven Gauze Sponge, Non-Sterile 4x4 in 1 x Per Week/30 Days Discharge Instructions: Apply over primary dressing as directed. Secondary Dressing: ABD Pad, 8x10 1 x Per Week/30 Days Discharge Instructions: Apply over primary dressing as directed. Com pression Wrap: CoFlex TLC XL 2-layer Compression System 4x7 (in/yd) 1 x Per Week/30 Days Discharge Instructions: Apply CoFlex 2-layer compression as directed. (alt for 4 layer) 1. Apligraf #4 in the standard fashion continued improvement Electronic Signature(s) Signed: 08/11/2021 4:28:25 PM By: Baltazar Najjar MD Entered By: Baltazar Najjar on 08/11/2021  14:46:13 -------------------------------------------------------------------------------- SuperBill Details Patient Name: Date of Service: ADDALEIGH, NICHOLLS 08/11/2021 Medical Record Number: [id number] Patient Account Number: [id number] Date of Birth/Sex: Treating RN: [birth year] (80 y.o. [name] Primary Care Provider: [name] Other Clinician: Referring Provider: Treating Provider/Extender: Angus Palms Weeks in Treatment: 12 Diagnosis Coding ICD-10 Codes Code Description [phone] Chronic venous hypertension (idiopathic) with ulcer and inflammation of left lower extremity L97.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 of skin Facility Procedures CPT4 Code: [id number] Description: (Facility Use Only) Apligraf 1 SQ CM Modifier: Quantity: 44 CPT4 Code: [id number] Description: 15271 - SKIN SUB GRAFT TRNK/ARM/LEG ICD-10 Diagnosis Description L97.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 Modifier: of skin Quantity: 1 Physician Procedures : CPT4 Code Description Modifier [id number] 15271 - WC PHYS SKIN SUB GRAFT TRNK/ARM/LEG ICD-10 Diagnosis Description L97.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 of skin Quantity: 1 Electronic Signature(s) Signed: 08/11/2021 4:28:25 PM By: Baltazar Najjar MD Entered By: Baltazar Najjar on 08/11/2021 14:46:25

## 2021-08-18 ENCOUNTER — Encounter (HOSPITAL_BASED_OUTPATIENT_CLINIC_OR_DEPARTMENT_OTHER): Payer: Medicare PPO | Attending: Internal Medicine | Admitting: Internal Medicine

## 2021-08-18 ENCOUNTER — Other Ambulatory Visit: Payer: Self-pay

## 2021-08-18 DIAGNOSIS — I10 Essential (primary) hypertension: Secondary | ICD-10-CM | POA: Insufficient documentation

## 2021-08-18 DIAGNOSIS — E7800 Pure hypercholesterolemia, unspecified: Secondary | ICD-10-CM | POA: Insufficient documentation

## 2021-08-18 DIAGNOSIS — I87332 Chronic venous hypertension (idiopathic) with ulcer and inflammation of left lower extremity: Secondary | ICD-10-CM | POA: Insufficient documentation

## 2021-08-18 DIAGNOSIS — L97822 Non-pressure chronic ulcer of other part of left lower leg with fat layer exposed: Secondary | ICD-10-CM | POA: Diagnosis not present

## 2021-08-18 NOTE — Progress Notes (Signed)
Pam [name] ([id number]) Visit Report for 08/18/2021 Arrival Information Details Patient Name: Date of Service: Pam [name], Pam [name] 08/18/2021 1:45 PM Medical Record Number: [id number] Patient Account Number: [id number] Date of Birth/Sex: Treating RN: 10/17/1941 (80 y.o. [name] Primary Care Rai Sinagra: [name] Other Clinician: Referring Briana Newman: Treating Jamie Belger/Extender: Angus Palms Weeks in Treatment: 13 Visit Information History Since Last Visit Added or deleted any medications: No Patient Arrived: Ambulatory Any new allergies or adverse reactions: No Arrival Time: 14:05 Had a fall or experienced change in No Accompanied By: alone activities of daily living that may affect Transfer Assistance: None risk of falls: Patient Identification Verified: Yes Signs or symptoms of abuse/neglect since last visito No Secondary Verification Process Completed: Yes Hospitalized since last visit: No Patient Requires Transmission-Based Precautions: No Implantable device outside of the clinic excluding No Patient Has Alerts: No cellular tissue based products placed in the center since last visit: Has Dressing in Place as Prescribed: Yes Has Compression in Place as Prescribed: Yes Pain Present Now: No Electronic Signature(s) Signed: 08/18/2021 5:49:01 PM By: Zandra Abts RN, BSN Entered By: Zandra Abts on 08/18/2021 14:37:36 -------------------------------------------------------------------------------- Compression Therapy Details Patient Name: Date of Service: Pam [name] 08/18/2021 1:45 PM Medical Record Number: [id number] Patient Account Number: [id number] Date of Birth/Sex: Treating RN: 05-17-1941 (80 y.o. [name] Primary Care Zailey Audia: [name] Other Clinician: Referring Baleria Wyman: Treating Marialy Urbanczyk/Extender: Angus Palms Weeks in Treatment: 13 Compression Therapy Performed for Wound Assessment: Wound #1 Left,Medial Lower  Leg Performed By: Clinician Zandra Abts, RN Compression Type: Double Layer Electronic Signature(s) Signed: 08/18/2021 5:49:01 PM By: Zandra Abts RN, BSN Entered By: Zandra Abts on 08/18/2021 14:38:17 -------------------------------------------------------------------------------- Encounter Discharge Information Details Patient Name: Date of Service: Pam [name] 08/18/2021 1:45 PM Medical Record Number: [id number] Patient Account Number: [id number] Date of Birth/Sex: Treating RN: [birth year] (80 y.o. [name] Primary Care Athalee Esterline: [name] Other Clinician: Referring Maksymilian Mabey: Treating Mikayla Chiusano/Extender: Angus Palms Weeks in Treatment: 13 Encounter Discharge Information Items Discharge Condition: Stable Ambulatory Status: Ambulatory Discharge Destination: Home Transportation: Private Auto Accompanied By: alone Schedule Follow-up Appointment: Yes Clinical Summary of Care: Patient Declined Electronic Signature(s) Signed: 08/18/2021 5:49:01 PM By: Zandra Abts RN, BSN Entered By: Zandra Abts on 08/18/2021 14:38:49 -------------------------------------------------------------------------------- Patient/Caregiver Education Details Patient Name: Date of Service: Pam [name] 9/7/2022andnbsp1:45 PM Medical Record Number: [id number] Patient Account Number: [id number] Date of Birth/Gender: Treating RN: 07/31/1941 (80 y.o. [name] Primary Care Physician: [name] Other Clinician: Referring Physician: Treating Physician/Extender: Burgess Estelle in Treatment: 13 Education Assessment Education Provided To: Patient Education Topics Provided Wound/Skin Impairment: Methods: Explain/Verbal Responses: State content correctly [employer]) Signed: 08/18/2021 5:49:01 PM By: Zandra Abts RN, BSN Entered By: Zandra Abts on 08/18/2021  14:38:37 -------------------------------------------------------------------------------- Wound Assessment Details Patient Name: Date of Service: Pam [name] 08/18/2021 1:45 PM Medical Record Number: [id number] Patient Account Number: [id number] Date of Birth/Sex: Treating RN: 25-Dec-1940 (80 y.o. [name] Primary Care Deondrea Aguado: [name] Other Clinician: Referring Yaritzi Craun: Treating Murat Rideout/Extender: Angus Palms Weeks in Treatment: 13 Wound Status Wound Number: 1 Primary Etiology: Venous Leg Ulcer Wound Location: Left, Medial Lower Leg Wound Status: Open Wounding Event: Gradually Appeared Comorbid History: Cataracts, Glaucoma, Hypertension, Osteoarthritis Date Acquired: 03/14/2021 Weeks Of Treatment: 13 Clustered Wound: No Wound Measurements Length: (cm) 1 Width: (cm) 1.2 Depth: (cm) 0.1 Area: (cm) 0.942 Volume: (cm) 0.094 % Reduction in Area: 97.9% % Reduction in Volume: 98% Epithelialization: Small (1-33%) Tunneling:  No Undermining: No Wound Description Classification: Full Thickness With Exposed Support Structures Wound Margin: Distinct, outline attached Exudate Amount: Medium Exudate Type: Serosanguineous Exudate Color: red, brown Foul Odor After Cleansing: No Slough/Fibrino Yes Wound Bed Granulation Amount: Large (67-100%) Exposed Structure Granulation Quality: Red, Pink Fascia Exposed: No Necrotic Amount: Small (1-33%) Fat Layer (Subcutaneous Tissue) Exposed: Yes Necrotic Quality: Eschar, Adherent Slough Tendon Exposed: No Muscle Exposed: No Joint Exposed: No Bone Exposed: No Treatment Notes Wound #1 (Lower Leg) Wound Laterality: Left, Medial Cleanser Soap and Water Discharge Instruction: May shower and wash wound with dial antibacterial soap and water prior to dressing change. Wound Cleanser Discharge Instruction: Cleanse the wound with wound cleanser prior to applying a clean dressing using gauze sponges, not tissue or  cotton balls. Peri-Wound Care Triamcinolone 15 (g) Discharge Instruction: to periwouind mixed with lotion Sween Lotion (Moisturizing lotion) Discharge Instruction: Apply to periwound Topical Primary Dressing Apligraf #4 Secondary Dressing Woven Gauze Sponge, Non-Sterile 4x4 in Discharge Instruction: Apply over primary dressing as directed. ABD Pad, 8x10 Discharge Instruction: Apply over primary dressing as directed. Secured With Compression Wrap CoFlex TLC XL 2-layer Compression System 4x7 (in/yd) Discharge Instruction: Apply CoFlex 2-layer compression as directed. (alt for 4 layer) Compression Stockings Add-Ons Electronic Signature(s) Signed: 08/18/2021 5:49:01 PM By: Zandra Abts RN, BSN Entered By: Zandra Abts on 08/18/2021 14:38:05 -------------------------------------------------------------------------------- Vitals Details Patient Name: Date of Service: TYNIESHA, HOWALD NELL 08/18/2021 1:45 PM Medical Record Number: [id number] Patient Account Number: [id number] Date of Birth/Sex: Treating RN: [birth year] (80 y.o. [name] Primary Care Nil Bolser: [name] Other Clinician: Referring Tanith Dagostino: Treating Millissa Deese/Extender: Angus Palms Weeks in Treatment: 13 Vital Signs Time Taken: 14:05 Temperature (F): 97.7 Height (in): 64 Pulse (bpm): 73 Weight (lbs): 180 Respiratory Rate (breaths/min): 16 Body Mass Index (BMI): 30.9 Blood Pressure (mmHg): 149/84 Reference Range: 80 - 120 mg / dl Electronic Signature(s) Signed: 08/18/2021 5:49:01 PM By: Zandra Abts RN, BSN Entered By: Zandra Abts on 08/18/2021 14:37:51

## 2021-08-18 NOTE — Progress Notes (Signed)
Pam [name], Pam [name] ([id number]) Visit Report for 08/18/2021 SuperBill Details Patient Name: Date of Service: Pam [name], Pam [name] 08/18/2021 Medical Record Number: [id number] Patient Account Number: [id number] Date of Birth/Sex: Treating RN: 08-04-1941 (80 y.o. [name] Primary Care Provider: [name] Other Clinician: Referring Provider: Treating Provider/Extender: Angus Palms Weeks in Treatment: 13 Diagnosis Coding ICD-10 Codes Code Description [id number] Chronic venous hypertension (idiopathic) with ulcer and inflammation of left lower extremity L97.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 of skin Facility Procedures CPT4 Code Description Modifier Quantity [id number] (Facility Use Only) [phone] - APPLY MULTLAY COMPRS LWR LT LEG 1 Electronic Signature(s) Signed: 08/18/2021 3:40:46 PM By: Baltazar Najjar MD Signed: 08/18/2021 5:49:01 PM By: Zandra Abts RN, BSN Entered By: Zandra Abts on 08/18/2021 14:39:01

## 2021-08-25 ENCOUNTER — Other Ambulatory Visit: Payer: Self-pay

## 2021-08-25 ENCOUNTER — Encounter (HOSPITAL_BASED_OUTPATIENT_CLINIC_OR_DEPARTMENT_OTHER): Payer: Medicare PPO | Admitting: Internal Medicine

## 2021-08-25 DIAGNOSIS — I87332 Chronic venous hypertension (idiopathic) with ulcer and inflammation of left lower extremity: Secondary | ICD-10-CM | POA: Diagnosis not present

## 2021-08-26 NOTE — Progress Notes (Signed)
KATALENA, MALVEAUX ([id number]) Visit Report for 08/25/2021 Arrival Information Details Patient Name: Date of Service: CLARIECE, ROESLER 08/25/2021 2:30 PM Medical Record Number: [id number] Patient Account Number: [id number] Date of Birth/Sex: Treating RN: [birth year] (80 y.o. [name], Lauren Primary Care Adelei Scobey: Donald Prose Other Clinician: Referring Ja Ohman: Treating Havyn Ramo/Extender: Charyl Dancer Weeks in Treatment: 14 Visit Information History Since Last Visit Added or deleted any medications: No Patient Arrived: Ambulatory Any new allergies or adverse reactions: No Arrival Time: 15:14 Had a fall or experienced change in No Accompanied By: self activities of daily living that may affect Transfer Assistance: None risk of falls: Patient Identification Verified: Yes Signs or symptoms of abuse/neglect since last visito No Secondary Verification Process Completed: Yes Hospitalized since last visit: No Patient Requires Transmission-Based Precautions: No Implantable device outside of the clinic excluding No Patient Has Alerts: No cellular tissue based products placed in the center since last visit: Has Dressing in Place as Prescribed: Yes Pain Present Now: No Electronic Signature(s) Signed: 08/26/2021 11:03:59 AM By: Sandre Kitty Entered By: Sandre Kitty on 08/25/2021 15:15:34 -------------------------------------------------------------------------------- Compression Therapy Details Patient Name: Date of Service: ANQUINETTE, PIERRO 08/25/2021 2:30 PM Medical Record Number: [id number] Patient Account Number: [id number] Date of Birth/Sex: Treating RN: 1941-10-15 (80 y.o. [name], Lauren Primary Care Fernando Torry: Donald Prose Other Clinician: Referring Claudia Alvizo: Treating Ashira Kirsten/Extender: Charyl Dancer Weeks in Treatment: 14 Compression Therapy Performed for Wound Assessment: Wound #1 Left,Medial Lower Leg Performed By: Clinician Rhae Hammock, RN Compression Type: Double Layer Post Procedure Diagnosis Same as Pre-procedure Electronic Signature(s) Signed: 08/26/2021 11:03:59 AM By: Sandre Kitty Entered By: Sandre Kitty on 08/25/2021 15:38:43 -------------------------------------------------------------------------------- Encounter Discharge Information Details Patient Name: Date of Service: CATHERIN, DOORN NELL 08/25/2021 2:30 PM Medical Record Number: [id number] Patient Account Number: [id number] Date of Birth/Sex: Treating RN: 05-08-1941 (80 y.o. [name], Lauren Primary Care Justise Ehmann: Donald Prose Other Clinician: Referring Zykia Walla: Treating Sumiya Mamaril/Extender: Charyl Dancer Weeks in Treatment: 14 Encounter Discharge Information Items Discharge Condition: Stable Ambulatory Status: Ambulatory Discharge Destination: Home Transportation: Private Auto Schedule Follow-up Appointment: Yes Clinical Summary of Care: Provided on 08/25/2021 Form Type Recipient Paper Patient Patient Electronic Signature(s) Signed: 08/26/2021 11:03:59 AM By: Sandre Kitty Entered By: Sandre Kitty on 08/25/2021 15:54:37 -------------------------------------------------------------------------------- Lower Extremity Assessment Details Patient Name: Date of Service: CEDRIC, DENISON 08/25/2021 2:30 PM Medical Record Number: [id number] Patient Account Number: [id number] Date of Birth/Sex: Treating RN: [birth year] (80 y.o. [name], Lauren Primary Care Rani Sisney: Donald Prose Other Clinician: Referring Yuliza Cara: Treating Lulie Hurd/Extender: Charyl Dancer Weeks in Treatment: 14 Edema Assessment Assessed: Shirlyn Goltz: Yes] Patrice Paradise: No] Edema: [Left: Ye] [Right: s] Calf Left: Right: Point of Measurement: 35 cm From Medial Instep 31 cm Ankle Left: Right: Point of Measurement: 9 cm From Medial Instep 20 cm Vascular Assessment Pulses: Dorsalis Pedis Palpable: [Left:Yes] Electronic  Signature(s) Signed: 08/26/2021 11:03:59 AM By: Sandre Kitty Signed: 08/26/2021 4:34:42 PM By: Rhae Hammock RN Entered By: Sandre Kitty on 08/25/2021 15:28:22 -------------------------------------------------------------------------------- Multi Wound Chart Details Patient Name: Date of Service: AVONNA, IRIBE NELL 08/25/2021 2:30 PM Medical Record Number: [id number] Patient Account Number: [id number] Date of Birth/Sex: Treating RN: 11-17-1941 (80 y.o. [name], Lauren Primary Care Kennesha Brewbaker: Donald Prose Other Clinician: Referring Chavela Justiniano: Treating Jaelen Soth/Extender: Charyl Dancer Weeks in Treatment: 14 Vital Signs Height(in): 64 Pulse(bpm): 104 Weight(lbs): 180 Blood Pressure(mmHg): 147/84 Body Mass Index(BMI): 31 Temperature(F): 98.1 Respiratory Rate(breaths/min): 16 Photos: [N/A:N/A] Left, Medial Lower Leg N/A N/A Wound Location: Gradually Appeared N/A N/A Wounding Event:  Venous Leg Ulcer N/A N/A Primary Etiology: Cataracts, Glaucoma, Hypertension, N/A N/A Comorbid History: Osteoarthritis 03/14/2021 N/A N/A Date Acquired: 14 N/A N/A Weeks of Treatment: Open N/A N/A Wound Status: 1x0.8x0.1 N/A N/A Measurements L x W x D (cm) 0.628 N/A N/A A (cm) : rea 0.063 N/A N/A Volume (cm) : 98.60% N/A N/A % Reduction in Area: 98.60% N/A N/A % Reduction in Volume: Full Thickness With Exposed Support N/A N/A Classification: Structures Medium N/A N/A Exudate Amount: Serosanguineous N/A N/A Exudate Type: red, brown N/A N/A Exudate Color: Distinct, outline attached N/A N/A Wound Margin: Large (67-100%) N/A N/A Granulation Amount: Red, Pink N/A N/A Granulation Quality: None Present (0%) N/A N/A Necrotic Amount: Fat Layer (Subcutaneous Tissue): Yes N/A N/A Exposed Structures: Fascia: No Tendon: No Muscle: No Joint: No Bone: No Medium (34-66%) N/A N/A Epithelialization: Compression Therapy N/A N/A Procedures Performed: Treatment  Notes Electronic Signature(s) Signed: 08/25/2021 4:44:54 PM By: Linton Ham MD Signed: 08/26/2021 4:34:42 PM By: Rhae Hammock RN Entered By: Linton Ham on 08/25/2021 15:41:29 -------------------------------------------------------------------------------- Multi-Disciplinary Care Plan Details Patient Name: Date of Service: LORRAYNE, ISMAEL NELL 08/25/2021 2:30 PM Medical Record Number: [id number] Patient Account Number: [id number] Date of Birth/Sex: Treating RN: November 10, 1941 (80 y.o. [name], Lauren Primary Care Athel Merriweather: Donald Prose Other Clinician: Referring Ashten Prats: Treating Dianely Krehbiel/Extender: Charyl Dancer Weeks in Treatment: 14 Active Inactive Venous Leg Ulcer Nursing Diagnoses: Actual venous Insuffiency (use after diagnosis is confirmed) Goals: Patient will maintain optimal edema control Date Initiated: 05/14/2021 Target Resolution Date: 09/02/2021 Goal Status: Active Patient/caregiver will verbalize understanding of disease process and disease management Date Initiated: 05/14/2021 Date Inactivated: 06/09/2021 Target Resolution Date: 06/11/2021 Goal Status: Met Interventions: Assess peripheral edema status every visit. Compression as ordered Treatment Activities: Therapeutic compression applied : 05/14/2021 Notes: Wound/Skin Impairment Nursing Diagnoses: Impaired tissue integrity Goals: Patient/caregiver will verbalize understanding of skin care regimen Date Initiated: 05/14/2021 Target Resolution Date: 09/02/2021 Goal Status: Active Ulcer/skin breakdown will have a volume reduction of 30% by week 4 Date Initiated: 05/14/2021 Date Inactivated: 06/16/2021 Target Resolution Date: 06/18/2021 Goal Status: Met Interventions: Assess patient/caregiver ability to obtain necessary supplies Assess patient/caregiver ability to perform ulcer/skin care regimen upon admission and as needed Assess ulceration(s) every visit Provide education on ulcer and skin  care Treatment Activities: Skin care regimen initiated : 05/14/2021 Topical wound management initiated : 05/14/2021 Notes: Electronic Signature(s) Signed: 08/26/2021 11:03:59 AM By: Sandre Kitty Signed: 08/26/2021 4:34:42 PM By: Rhae Hammock RN Entered By: Sandre Kitty on 08/25/2021 15:25:40 -------------------------------------------------------------------------------- Pain Assessment Details Patient Name: Date of Service: ASHARA, [place] NELL 08/25/2021 2:30 PM Medical Record Number: [id number] Patient Account Number: [id number] Date of Birth/Sex: Treating RN: 10/30/1941 (80 y.o. [name], Lauren Primary Care Filippa Yarbough: Donald Prose Other Clinician: Referring Maneh Sieben: Treating Mija Effertz/Extender: Charyl Dancer Weeks in Treatment: 14 Active Problems Location of Pain Severity and Description of Pain Patient Has Paino No Site Locations Pain Management and Medication Current Pain Management: Electronic Signature(s) Signed: 08/26/2021 11:03:59 AM By: Sandre Kitty Signed: 08/26/2021 4:34:42 PM By: Rhae Hammock RN Entered By: Sandre Kitty on 08/25/2021 15:15:45 -------------------------------------------------------------------------------- Patient/Caregiver Education Details Patient Name: Date of Service: WALDINE, ZENZ 9/14/2022andnbsp2:30 PM Medical Record Number: [id number] Patient Account Number: [id number] Date of Birth/Gender: Treating RN: 23-Dec-1940 (80 y.o. Benjaman Lobe Primary Care Physician: Donald Prose Other Clinician: Referring Physician: Treating Physician/Extender: Gwendalyn Ege in Treatment: 14 Education Assessment Education Provided To: Patient Education Topics Provided Venous: Methods: Explain/Verbal, Printed Responses: State content correctly Wound/Skin Impairment: Methods: Explain/Verbal,  Printed Responses: State content correctly Electronic Signature(s) Signed: 08/26/2021 11:03:59 AM By:  Sandre Kitty Entered By: Sandre Kitty on 08/25/2021 15:26:07 -------------------------------------------------------------------------------- Wound Assessment Details Patient Name: Date of Service: PHILANA, YOUNIS 08/25/2021 2:30 PM Medical Record Number: [id number] Patient Account Number: [id number] Date of Birth/Sex: Treating RN: Dec 28, 1940 (80 y.o. [name], Lauren Primary Care Riki Berninger: Donald Prose Other Clinician: Referring Amberlin Utke: Treating Kashis Penley/Extender: Charyl Dancer Weeks in Treatment: 14 Wound Status Wound Number: 1 Primary Etiology: Venous Leg Ulcer Wound Location: Left, Medial Lower Leg Wound Status: Open Wounding Event: Gradually Appeared Comorbid History: Cataracts, Glaucoma, Hypertension, Osteoarthritis Date Acquired: 03/14/2021 Weeks Of Treatment: 14 Clustered Wound: No Photos Wound Measurements Length: (cm) 1 Width: (cm) 0.8 Depth: (cm) 0.1 Area: (cm) 0.628 Volume: (cm) 0.063 % Reduction in Area: 98.6% % Reduction in Volume: 98.6% Epithelialization: Medium (34-66%) Tunneling: No Undermining: No Wound Description Classification: Full Thickness With Exposed Support Structures Wound Margin: Distinct, outline attached Exudate Amount: Medium Exudate Type: Serosanguineous Exudate Color: red, brown Foul Odor After Cleansing: No Slough/Fibrino No Wound Bed Granulation Amount: Large (67-100%) Exposed Structure Granulation Quality: Red, Pink Fascia Exposed: No Necrotic Amount: None Present (0%) Fat Layer (Subcutaneous Tissue) Exposed: Yes Tendon Exposed: No Muscle Exposed: No Joint Exposed: No Bone Exposed: No Treatment Notes Wound #1 (Lower Leg) Wound Laterality: Left, Medial Cleanser Soap and Water Discharge Instruction: May shower and wash wound with dial antibacterial soap and water prior to dressing change. Wound Cleanser Discharge Instruction: Cleanse the wound with wound cleanser prior to applying a clean  dressing using gauze sponges, not tissue or cotton balls. Peri-Wound Care Triamcinolone 15 (g) Discharge Instruction: to periwouind mixed with lotion Sween Lotion (Moisturizing lotion) Discharge Instruction: Apply to periwound Topical Primary Dressing Hydrofera Blue Classic Foam, 2x2 in Discharge Instruction: Moisten with saline prior to applying to wound bed Secondary Dressing Woven Gauze Sponge, Non-Sterile 4x4 in Discharge Instruction: Apply over primary dressing as directed. ABD Pad, 8x10 Discharge Instruction: Apply over primary dressing as directed. Secured With Compression Wrap CoFlex TLC XL 2-layer Compression System 4x7 (in/yd) Discharge Instruction: Apply CoFlex 2-layer compression as directed. (alt for 4 layer) Compression Stockings Add-Ons Electronic Signature(s) Signed: 08/26/2021 11:03:59 AM By: Sandre Kitty Signed: 08/26/2021 4:34:42 PM By: Rhae Hammock RN Entered By: Sandre Kitty on 08/25/2021 15:24:57 -------------------------------------------------------------------------------- Vitals Details Patient Name: Date of Service: KENYIA, WAMBOLT NELL 08/25/2021 2:30 PM Medical Record Number: [id number] Patient Account Number: [id number] Date of Birth/Sex: Treating RN: 07/15/1941 (80 y.o. [name], Lauren Primary Care Kemiya Batdorf: Donald Prose Other Clinician: Referring Victoria Euceda: Treating [place] Seaborn/Extender: Charyl Dancer Weeks in Treatment: 14 Vital Signs Time Taken: 15:19 Temperature (F): 98.1 Height (in): 64 Pulse (bpm): 79 Weight (lbs): 180 Respiratory Rate (breaths/min): 16 Body Mass Index (BMI): 30.9 Blood Pressure (mmHg): 147/84 Reference Range: 80 - 120 mg / dl Electronic Signature(s) Signed: 08/26/2021 11:03:59 AM By: Sandre Kitty Entered By: Sandre Kitty on 08/25/2021 15:19:44

## 2021-08-26 NOTE — Progress Notes (Signed)
Pam [name], Pam [name] ([id number]) Visit Report for 08/25/2021 HPI Details Patient Name: Date of Service: Pam [name], Pam [name] 08/25/2021 2:30 PM Medical Record Number: [id number] Patient Account Number: [id number] Date of Birth/Sex: Treating RN: 10-22-1941 (80 y.o. Pam [name], Pam [name] Primary Care Provider: Deatra [name] Other Clinician: Referring Provider: Treating Provider/Extender: Angus Palms Weeks in Treatment: 14 History of Present Illness HPI Description: ADMISSION 05/14/2021 with This is a 80 year old reasonably healthy woman who is still working at [employer] and about to retire at the end of the month. She has had a problem with lower extremity edema for about the last 6 months she tells me. 2 months ago she noted small bumps on both of her legs however the left one medially morphed into a large blister. She was seen by her primary physician Dr. [name] on 04/19/2021. Put on doxycycline. He noted at that time that the wound had been weeping for the last 5 weeks. The patient does not have a wound history. 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 We used Iodoflex under compression last week. 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 We have been using Iodoflex to clean up the wound surface. She came in with drainage. 6/24; wound on the left medial lower leg. Using Iodoflex to clean up the wound surface. We are making gradual progress with that. Nursing reported an odor and drainage. 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 Mentioning this because  I would like to see about Apligraf 6/29; left medial lower leg using Iodoflex to date. Making gradual improvement with a nonviable surface. 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 Close to ordering Apligraf through her insuranceo Next week 7/6; still have not heard about Apligraf through her insurance. She is making gradual improvement in the condition of this wound bed. She is retired and is now under [employer]. We have been using Hydrofera Blue under compression This is a fairly large wound. I have suspected that this was a venous insufficiency ulcer. As it is not really improving with standard therapy I have elected to go ahead and order venous reflux studies today 7/13; she is approved for Apligraf we will order that for next week. She continues to make decent improvements slightly smaller today surface looks better we have been using Hydrofera Blue under compression. She has her venous reflux studies next 7/20; Apligraf #1 07/14/2021 upon evaluation today patient appears to be doing excellent in regard to her wound. This is actually the first time I am seeing her. With that being said she does have good progress and improvement noted based on what I am seeing today and this is actually a progression #2 a for Apligraf. 8/17; improvement Apligraf #3. No concerns stated by the patient 8/31 Apligraf #4 very superficial now healthy granulation and smaller dimension 9/14; arrives with the wound having no depth and measurements of 1 x 0.8 cm. Given the very small nature and the healthy looking wound surface I did not feel it was worth putting Apligraf #5 on. Switched her to [facility]  Blue still under the same compression Electronic Signature(s) Signed: 08/25/2021 4:44:54 PM By: Baltazar Najjar MD Entered By: Baltazar Najjar on 08/25/2021 15:42:24 -------------------------------------------------------------------------------- Physical Exam Details Patient Name: Date of  Service: [name], [name] 08/25/2021 2:30 PM Medical Record Number: [id number] Patient Account Number: [id number] Date of Birth/Sex: Treating RN: 1941/03/17 (80 y.o. Toniann Fail Primary Care Provider: Deatra [name] Other Clinician: Referring Provider: Treating Provider/Extender: Angus Palms Weeks in Treatment: 14 Constitutional Sitting or standing Blood Pressure is within target range for patient.. Pulse regular and within target range for patient.Marland Kitchen Respirations regular, non-labored and within target range.. Temperature is normal and within the target range for the patient.Marland Kitchen Appears in no distress. Cardiovascular Pedal pulses are palpable. Notes Wound exam; left medial calf. Very healthy looking granulation in fact I think there may be some slight hypergranulation. Rim of epithelialization looks normal. We have excellent edema control Electronic Signature(s) Signed: 08/25/2021 4:44:54 PM By: Baltazar Najjar MD Entered By: Baltazar Najjar on 08/25/2021 15:43:29 -------------------------------------------------------------------------------- Physician Orders Details Patient Name: Date of Service: Pam [name], Pam [name] 08/25/2021 2:30 PM Medical Record Number: [id number] Patient Account Number: [id number] Date of Birth/Sex: Treating RN: 03/26/1941 (80 y.o. Pam [name], Pam [name] Primary Care Provider: Deatra [name] Other Clinician: Referring Provider: Treating Provider/Extender: Angus Palms Weeks in Treatment: 14 Verbal / Phone Orders: No Diagnosis Coding ICD-10 Coding Code Description I87.332 Chronic venous hypertension (idiopathic) with ulcer and inflammation of left lower extremity L97.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 of skin Follow-up Appointments ppointment in 1 week. - with Dr. Leanord Hawking Return A Cellular or Tissue Based Products Cellular or Tissue Based Product Type: - Apligraf #4 Bathing/ Shower/  Hygiene May shower with protection but do not get wound dressing(s) wet. Edema Control - Lymphedema / SCD / Other Elevate legs to the level of the heart or above for 30 minutes daily and/or when sitting, a frequency of: - throughout the day Avoid standing for long periods of time. Exercise regularly Additional Orders / Instructions Follow Nutritious Diet Wound Treatment Wound #1 - Lower Leg Wound Laterality: Left, Medial Cleanser: Soap and Water 1 x Per Week/30 Days Discharge Instructions: May shower and wash wound with dial antibacterial soap and water prior to dressing change. Cleanser: Wound Cleanser 1 x Per Week/30 Days Discharge Instructions: Cleanse the wound with wound cleanser prior to applying a clean dressing using gauze sponges, not tissue or cotton balls. Peri-Wound Care: Triamcinolone 15 (g) 1 x Per Week/30 Days Discharge Instructions: to periwouind mixed with lotion Peri-Wound Care: Sween Lotion (Moisturizing lotion) 1 x Per Week/30 Days Discharge Instructions: Apply to periwound Prim Dressing: Hydrofera Blue Classic Foam, 2x2 in 1 x Per Week/30 Days ary Discharge Instructions: Moisten with saline prior to applying to wound bed Secondary Dressing: Woven Gauze Sponge, Non-Sterile 4x4 in 1 x Per Week/30 Days Discharge Instructions: Apply over primary dressing as directed. Secondary Dressing: ABD Pad, 8x10 1 x Per Week/30 Days Discharge Instructions: Apply over primary dressing as directed. Compression Wrap: CoFlex TLC XL 2-layer Compression System 4x7 (in/yd) 1 x Per Week/30 Days Discharge Instructions: Apply CoFlex 2-layer compression as directed. (alt for 4 layer) Electronic Signature(s) Signed: 08/25/2021 4:44:54 PM By: Baltazar Najjar MD Signed: 08/26/2021 11:03:59 AM By: Karl Ito Entered By: Karl Ito on 08/25/2021 15:40:04 -------------------------------------------------------------------------------- Problem List Details Patient Name: Date of  Service: Pam [name], Pam [name] 08/25/2021 2:30 PM Medical Record Number: [id number] Patient Account Number: [id number] Date of Birth/Sex: Treating RN: 03-19-1941 (80 y.o.  Pam [name], Pam [name] Primary Care Provider: Deatra [name] Other Clinician: Referring Provider: Treating Provider/Extender: Angus Palms Weeks in Treatment: 14 Active Problems ICD-10 Encounter Code Description Active Date MDM Diagnosis I87.332 Chronic venous hypertension (idiopathic) with ulcer and inflammation of left 05/14/2021 No Yes lower extremity L97.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 05/14/2021 No Yes of skin Inactive Problems Resolved Problems Electronic Signature(s) Signed: 08/25/2021 4:44:54 PM By: Baltazar Najjar MD Entered By: Baltazar Najjar on 08/25/2021 15:41:22 -------------------------------------------------------------------------------- Progress Note Details Patient Name: Date of Service: Pam [name], Pam [name] 08/25/2021 2:30 PM Medical Record Number: [id number] Patient Account Number: [id number] Date of Birth/Sex: Treating RN: 02-25-1941 (80 y.o. Pam [name], Pam [name] Primary Care Provider: Deatra [name] Other Clinician: Referring Provider: Treating Provider/Extender: Angus Palms Weeks in Treatment: 14 Subjective History of Present Illness (HPI) ADMISSION 05/14/2021 with This is a 80 year old reasonably healthy woman who is still working at [employer] and about to retire at the end of the month. She has had a problem with lower extremity edema for about the last 6 months she tells me. 2 months ago she noted small bumps on both of her legs however the left one medially morphed into a large blister. She was seen by her primary physician Dr. [name] on 04/19/2021. Put on doxycycline. He noted at that time that the wound had been weeping for the last 5 weeks. The patient does not have a wound history. 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 We used Iodoflex under compression last week. 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 We have been using Iodoflex to clean up the wound surface. She came in with drainage. 6/24; wound on the left medial lower leg. Using Iodoflex to clean up the wound surface. We are making gradual progress with that. Nursing reported an odor and drainage. 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 Mentioning this because I would like to see about Apligraf 6/29; left medial lower leg using Iodoflex to date. Making gradual improvement with a nonviable surface. 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 Close to ordering Apligraf through her insuranceo Next week 7/6; still have not heard about Apligraf through her insurance. She is making gradual improvement in the condition of this wound bed. She is retired and is now under [employer]. We have been using Hydrofera Blue under compression This is a fairly large wound. I have suspected that this was a venous insufficiency ulcer. As it is not really improving with standard therapy I have elected to go ahead and order venous reflux studies today 7/13; she is approved for Apligraf we will order that for next week. She continues to make decent improvements slightly smaller today surface looks better we have been using Hydrofera Blue under compression. She has her venous reflux studies next 7/20; Apligraf #1 07/14/2021 upon evaluation today patient appears to be doing excellent in regard to her wound. This is  actually the first time I am seeing her. With that being said she does have  good progress and improvement noted based on what I am seeing today and this is actually a progression #2 a for Apligraf. 8/17; improvement Apligraf #3. No concerns stated by the patient 8/31 Apligraf #4 very superficial now healthy granulation and smaller dimension 9/14; arrives with the wound having no depth and measurements of 1 x 0.8 cm. Given the very small nature and the healthy looking wound surface I did not feel it was worth putting Apligraf #5 on. Switched her to [facility] still under the same compression Objective Constitutional Sitting or standing Blood Pressure is within target range for patient.. Pulse regular and within target range for patient.Marland Kitchen Respirations regular, non-labored and within target range.. Temperature is normal and within the target range for the patient.Marland Kitchen Appears in no distress. Vitals Time Taken: 3:19 PM, Height: 64 in, Weight: 180 lbs, BMI: 30.9, Temperature: 98.1 F, Pulse: 79 bpm, Respiratory Rate: 16 breaths/min, Blood Pressure: 147/84 mmHg. Cardiovascular Pedal pulses are palpable. General Notes: Wound exam; left medial calf. Very healthy looking granulation in fact I think there may be some slight hypergranulation. Rim of epithelialization looks normal. We have excellent edema control Integumentary (Hair, Skin) Wound #1 status is Open. Original cause of wound was Gradually Appeared. The date acquired was: 03/14/2021. The wound has been in treatment 14 weeks. The wound is located on the Left,Medial Lower Leg. The wound measures 1cm length x 0.8cm width x 0.1cm depth; 0.628cm^2 area and 0.063cm^3 volume. There is Fat Layer (Subcutaneous Tissue) exposed. There is no tunneling or undermining noted. There is a medium amount of serosanguineous drainage noted. The wound margin is distinct with the outline attached to the wound base. There is large (67-100%) red, pink granulation within  the wound bed. There is no necrotic tissue within the wound bed. 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 There was a Double Layer Compression Therapy Procedure by Fonnie Mu, RN. Post procedure Diagnosis Wound #1: Same as Pre-Procedure Plan Follow-up Appointments: Return Appointment in 1 week. - with Dr. Leanord Hawking Cellular or Tissue Based Products: Cellular or Tissue Based Product Type: - Apligraf #4 Bathing/ Shower/ Hygiene: May shower with protection but do not get wound dressing(s) wet. Edema Control - Lymphedema / SCD / Other: Elevate legs to the level of the heart or above for 30 minutes daily and/or when sitting, a frequency of: - throughout the day Avoid standing for long periods of time. Exercise regularly Additional Orders / Instructions: Follow Nutritious Diet WOUND #1: - Lower Leg Wound Laterality: Left, Medial Cleanser: Soap and Water 1 x Per Week/30 Days Discharge Instructions: May shower and wash wound with dial antibacterial soap and water prior to dressing change. Cleanser: Wound Cleanser 1 x Per Week/30 Days Discharge Instructions: Cleanse the wound with wound cleanser prior to applying a clean dressing using gauze sponges, not tissue or cotton balls. Peri-Wound Care: Triamcinolone 15 (g) 1 x Per Week/30 Days Discharge Instructions: to periwouind mixed with lotion Peri-Wound Care: Sween Lotion (Moisturizing lotion) 1 x Per Week/30 Days Discharge Instructions: Apply to periwound Prim Dressing: Hydrofera Blue Classic Foam, 2x2 in 1 x Per Week/30 Days ary Discharge Instructions: Moisten with saline prior to applying to wound bed Secondary Dressing: Woven Gauze Sponge,  Non-Sterile 4x4 in 1 x Per  Week/30 Days Discharge Instructions: Apply over primary dressing as directed. Secondary Dressing: ABD Pad, 8x10 1 x Per Week/30 Days Discharge Instructions: Apply over primary dressing as directed. Com pression Wrap: CoFlex TLC XL 2-layer Compression System 4x7 (in/yd) 1 x Per Week/30 Days Discharge Instructions: Apply CoFlex 2-layer compression as directed. (alt for 4 layer) 1. Given the healthy slightly hyper granulated surface and a nice healthy rim of epithelialization and a small wound I did not replace with Apligraf #5 2. Hydrofera Blue still under the same compression Electronic Signature(s) Signed: 08/25/2021 4:44:54 PM By: Baltazar Najjar MD Entered By: Baltazar Najjar on 08/25/2021 15:44:24 -------------------------------------------------------------------------------- SuperBill Details Patient Name: Date of Service: Pam [name], Pam [name] 08/25/2021 Medical Record Number: [id number] Patient Account Number: [id number] Date of Birth/Sex: Treating RN: 08/19/1941 (80 y.o. Pam [name], Pam [name] Primary Care Provider: Deatra [name] Other Clinician: Referring Provider: Treating Provider/Extender: Angus Palms Weeks in Treatment: 14 Diagnosis Coding ICD-10 Codes Code Description [phone] Chronic venous hypertension (idiopathic) with ulcer and inflammation of left lower extremity L97.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 of skin Facility Procedures CPT4 Code: [id number] IC Description: (Facility Use Only) [phone] - APPLY MULTLAY COMPRS LWR LT LEG D-10 Diagnosis Description L97.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 of skin Modifier: Quantity: 1 Physician Procedures : CPT4 Code Description Modifier [id number] - WC PHYS LEVEL 3 - EST PT ICD-10 Diagnosis Description L97.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 of skin  I87.332 Chronic venous hypertension (idiopathic) with  ulcer and inflammation of left lower extremity Quantity: 1 Electronic Signature(s) Signed: 08/25/2021 4:44:54 PM By: Baltazar Najjar MD Entered By: Baltazar Najjar on 08/25/2021 15:44:47

## 2021-09-01 ENCOUNTER — Encounter (HOSPITAL_BASED_OUTPATIENT_CLINIC_OR_DEPARTMENT_OTHER): Payer: Medicare PPO | Admitting: Internal Medicine

## 2021-09-01 ENCOUNTER — Other Ambulatory Visit: Payer: Self-pay

## 2021-09-01 DIAGNOSIS — I87332 Chronic venous hypertension (idiopathic) with ulcer and inflammation of left lower extremity: Secondary | ICD-10-CM | POA: Diagnosis not present

## 2021-09-01 NOTE — Progress Notes (Signed)
MALYNA, BUDNEY ([id number]) Visit Report for 09/01/2021 HPI Details Patient Name: Date of Service: JANARA, KLETT 09/01/2021 1:45 PM Medical Record Number: [id number] Patient Account Number: [id number] Date of Birth/Sex: Treating RN: Apr 23, 1941 (80 y.o. [name] Primary Care Provider: [name] Other Clinician: Referring Provider: Treating Provider/Extender: Angus Palms Weeks in Treatment: 15 History of Present Illness HPI Description: ADMISSION 05/14/2021 with This is a 80 year old reasonably healthy woman who is still working at [employer] and about to retire at the end of the month. She has had a problem with lower extremity edema for about the last 6 months she tells me. 2 months ago she noted small bumps on both of her legs however the left one medially morphed into a large blister. She was seen by her primary physician Dr. [name] on 04/19/2021. Put on doxycycline. He noted at that time that the wound had been weeping for the last 5 weeks. The patient does not have a wound history. 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 We used Iodoflex under compression last week. 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 We have been using Iodoflex to clean up the wound surface. She came in with drainage. 6/24; wound on the left medial lower leg. Using Iodoflex to clean up the wound surface. We are making gradual progress with that. Nursing reported an odor and drainage. 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 Mentioning this because  I would like to see about Apligraf 6/29; left medial lower leg using Iodoflex to date. Making gradual improvement with a nonviable surface. 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 Close to ordering Apligraf through her insuranceo Next week 7/6; still have not heard about Apligraf through her insurance. She is making gradual improvement in the condition of this wound bed. She is retired and is now under [employer]. We have been using Hydrofera Blue under compression This is a fairly large wound. I have suspected that this was a venous insufficiency ulcer. As it is not really improving with standard therapy I have elected to go ahead and order venous reflux studies today 7/13; she is approved for Apligraf we will order that for next week. She continues to make decent improvements slightly smaller today surface looks better we have been using Hydrofera Blue under compression. She has her venous reflux studies next 7/20; Apligraf #1 07/14/2021 upon evaluation today patient appears to be doing excellent in regard to her wound. This is actually the first time I am seeing her. With that being said she does have good progress and improvement noted based on what I am seeing today and this is actually a progression #2 a for Apligraf. 8/17; improvement Apligraf #3. No concerns stated by the patient 8/31 Apligraf #4 very superficial now healthy granulation and smaller dimension 9/14; arrives with the wound having no depth and measurements of 1 x 0.8 cm. Given the very small nature and the healthy looking wound surface I did not feel it was worth putting Apligraf #5 on. Switched her to [facility]  Blue still under the same compression 9/21; surprisingly the patient arrives in clinic with the area actually is slightly bigger. Under illumination the surface of the wound looks healthy. I switched her to [facility] last week. Dimensions this week at 1.4 x 1.3 x 0.1 Electronic  Signature(s) Signed: 09/01/2021 4:56:16 PM By: Baltazar Najjar MD Entered By: Baltazar Najjar on 09/01/2021 14:50:45 -------------------------------------------------------------------------------- Physical Exam Details Patient Name: Date of Service: HAIFA, HATTON 09/01/2021 1:45 PM Medical Record Number: [id number] Patient Account Number: [id number] Date of Birth/Sex: Treating RN: 07-08-1941 (80 y.o. [name] Primary Care Provider: [name] Other Clinician: Referring Provider: Treating Provider/Extender: Angus Palms Weeks in Treatment: 15 Constitutional Sitting or standing Blood Pressure is within target range for patient.. Pulse regular and within target range for patient.Marland Kitchen Respirations regular, non-labored and within target range.. Temperature is normal and within the target range for the patient.Marland Kitchen Appears in no distress. Notes Wound exam; left medial calf. Very healthy looking wound there is no depth of this. Even under illumination I could not detect anything that seem to require debriding. Electronic Signature(s) Signed: 09/01/2021 4:56:16 PM By: Baltazar Najjar MD Entered By: Baltazar Najjar on 09/01/2021 14:52:36 -------------------------------------------------------------------------------- Physician Orders Details Patient Name: Date of Service: AFREEN, SIEBELS 09/01/2021 1:45 PM Medical Record Number: [id number] Patient Account Number: [id number] Date of Birth/Sex: Treating RN: Dec 25, 1940 (80 y.o. [name] Primary Care Provider: [name] Other Clinician: Referring Provider: Treating Provider/Extender: Angus Palms Weeks in Treatment: 15 Verbal / Phone Orders: No Diagnosis Coding ICD-10 Coding Code Description [phone] Chronic venous hypertension (idiopathic) with ulcer and inflammation of left lower extremity L97.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 of skin Follow-up  Appointments ppointment in 1 week. - with Dr. Leanord Hawking Return A Other: - Prescription for antibiotic sent to your pharmacy, take as directed. Cellular or Tissue Based Products Cellular or Tissue Based Product Type: - Apligraf #4 Bathing/ Shower/ Hygiene May shower with protection but do not get wound dressing(s) wet. Edema Control - Lymphedema / SCD / Other Elevate legs to the level of the heart or above for 30 minutes daily and/or when sitting, a frequency of: - throughout the day Avoid standing for long periods of time. Exercise regularly Additional Orders / Instructions Follow Nutritious Diet Wound Treatment Wound #1 - Lower Leg Wound Laterality: Left, Medial Cleanser: Soap and Water 1 x Per Week/30 Days Discharge Instructions: May shower and wash wound with dial antibacterial soap and water prior to dressing change. Cleanser: Wound Cleanser 1 x Per Week/30 Days Discharge Instructions: Cleanse the wound with wound cleanser prior to applying a clean dressing using gauze sponges, not tissue or cotton balls. Peri-Wound Care: Triamcinolone 15 (g) 1 x Per Week/30 Days Discharge Instructions: to periwouind mixed with lotion Peri-Wound Care: Sween Lotion (Moisturizing lotion) 1 x Per Week/30 Days Discharge Instructions: Apply to periwound Prim Dressing: Hydrofera Blue Classic Foam, 2x2 in 1 x Per Week/30 Days ary Discharge Instructions: Moisten with saline prior to applying to wound bed Secondary Dressing: Woven Gauze Sponge, Non-Sterile 4x4 in 1 x Per Week/30 Days Discharge Instructions: Apply over primary dressing as directed. Secondary Dressing: ABD Pad, 8x10 1 x Per Week/30 Days Discharge Instructions: Apply over primary dressing as directed. Compression Wrap: CoFlex TLC XL 2-layer Compression System 4x7 (in/yd) 1 x Per Week/30 Days Discharge Instructions: Apply CoFlex 2-layer compression as directed. (alt for 4 layer) Patient Medications llergies: No Known Allergies A Notifications  Medication  Indication Start End wound infection 09/01/2021 doxycycline monohydrate DOSE oral 100 mg capsule - 1 capsule oral bid for 5 days Electronic Signature(s) Signed: 09/01/2021 2:54:36 PM By: Baltazar Najjar MD Entered By: Baltazar Najjar on 09/01/2021 14:54:35 -------------------------------------------------------------------------------- Problem List Details Patient Name: Date of Service: AZALYN, SLIWA NELL 09/01/2021 1:45 PM Medical Record Number: [id number] Patient Account Number: [id number] Date of Birth/Sex: Treating RN: 10/01/1941 (80 y.o. [name] Primary Care Provider: [name] Other Clinician: Referring Provider: Treating Provider/Extender: Angus Palms Weeks in Treatment: 15 Active Problems ICD-10 Encounter Code Description Active Date MDM Diagnosis I87.332 Chronic venous hypertension (idiopathic) with ulcer and inflammation of left 05/14/2021 No Yes lower extremity L97.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 05/14/2021 No Yes of skin Inactive Problems Resolved Problems Electronic Signature(s) Signed: 09/01/2021 4:56:16 PM By: Baltazar Najjar MD Entered By: Baltazar Najjar on 09/01/2021 14:48:20 -------------------------------------------------------------------------------- Progress Note Details Patient Name: Date of Service: CRISTYN, CROSSNO NELL 09/01/2021 1:45 PM Medical Record Number: [id number] Patient Account Number: [id number] Date of Birth/Sex: Treating RN: Apr 13, 1941 (80 y.o. [name] Primary Care Provider: [name] Other Clinician: Referring Provider: Treating Provider/Extender: Angus Palms Weeks in Treatment: 15 Subjective History of Present Illness (HPI) ADMISSION 05/14/2021 with This is a 80 year old reasonably healthy woman who is still working at [employer] and about to retire at the end of the month. She has had a problem with lower extremity edema for  about the last 6 months she tells me. 2 months ago she noted small bumps on both of her legs however the left one medially morphed into a large blister. She was seen by her primary physician Dr. [name] on 04/19/2021. Put on doxycycline. He noted at that time that the wound had been weeping for the last 5 weeks. The patient does not have a wound history. 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 We used Iodoflex under compression last week. 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 We have been using Iodoflex to clean up the wound surface. She came in with drainage. 6/24; wound on the left medial lower leg. Using Iodoflex to clean up the wound surface. We are making gradual progress with that. Nursing reported an odor and drainage. 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 Mentioning this because I would like to see about Apligraf 6/29; left medial lower leg using Iodoflex to date. Making gradual improvement with a nonviable surface. 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 Close to ordering Apligraf through her insuranceo Next week 7/6; still have not heard about Apligraf through her insurance. She is making gradual improvement in the condition of this wound bed. She is retired and is now under [employer]. We have been using Hydrofera Blue under compression This is a fairly large wound. I have suspected that this was a venous insufficiency ulcer. As it is not really improving with standard therapy I have elected to go ahead and order venous  reflux studies today 7/13; she is approved for Apligraf  we will order that for next week. She continues to make decent improvements slightly smaller today surface looks better we have been using Hydrofera Blue under compression. She has her venous reflux studies next 7/20; Apligraf #1 07/14/2021 upon evaluation today patient appears to be doing excellent in regard to her wound. This is actually the first time I am seeing her. With that being said she does have good progress and improvement noted based on what I am seeing today and this is actually a progression #2 a for Apligraf. 8/17; improvement Apligraf #3. No concerns stated by the patient 8/31 Apligraf #4 very superficial now healthy granulation and smaller dimension 9/14; arrives with the wound having no depth and measurements of 1 x 0.8 cm. Given the very small nature and the healthy looking wound surface I did not feel it was worth putting Apligraf #5 on. Switched her to [facility] still under the same compression 9/21; surprisingly the patient arrives in clinic with the area actually is slightly bigger. Under illumination the surface of the wound looks healthy. I switched her to [facility] last week. Dimensions this week at 1.4 x 1.3 x 0.1 Objective Constitutional Sitting or standing Blood Pressure is within target range for patient.. Pulse regular and within target range for patient.Marland Kitchen Respirations regular, non-labored and within target range.. Temperature is normal and within the target range for the patient.Marland Kitchen Appears in no distress. Vitals Time Taken: 2:18 PM, Height: 64 in, Weight: 180 lbs, BMI: 30.9, Temperature: 97.9 F, Pulse: 70 bpm, Respiratory Rate: 16 breaths/min, Blood Pressure: 134/79 mmHg. General Notes: Wound exam; left medial calf. Very healthy looking wound there is no depth of this. Even under illumination I could not detect anything that seem to require debriding. Integumentary (Hair, Skin) Wound #1 status is  Open. Original cause of wound was Gradually Appeared. The date acquired was: 03/14/2021. The wound has been in treatment 15 weeks. The wound is located on the Left,Medial Lower Leg. The wound measures 1.4cm length x 1.3cm width x 0.1cm depth; 1.429cm^2 area and 0.143cm^3 volume. There is Fat Layer (Subcutaneous Tissue) exposed. There is a medium amount of serosanguineous drainage noted. The wound margin is distinct with the outline attached to the wound base. There is large (67-100%) red, pink granulation within the wound bed. There is no necrotic tissue within the wound bed. 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 There was a Double Layer Compression Therapy Procedure by [name] Iba, RN. Post procedure Diagnosis Wound #1: Same as Pre-Procedure Plan Follow-up Appointments: Return Appointment in 1 week. - with Dr. Leanord Hawking Other: - Prescription for antibiotic sent to your pharmacy, take as directed. Cellular or Tissue Based Products: Cellular or Tissue Based Product Type: - Apligraf #4 Bathing/ Shower/ Hygiene: May shower with protection but do not get wound dressing(s) wet. Edema Control - Lymphedema / SCD / Other: Elevate legs to the level of the heart or above for 30 minutes daily and/or when sitting, a frequency of: - throughout the day Avoid standing for long periods of time. Exercise regularly Additional Orders / Instructions: Follow Nutritious Diet The following medication(s) was prescribed: doxycycline monohydrate oral 100 mg capsule 1 capsule oral bid for 5 days for wound infection starting 09/01/2021 WOUND #1: - Lower  Leg Wound Laterality: Left, Medial Cleanser: Soap and Water 1 x Per Week/30  Days Discharge Instructions: May shower and wash wound with dial antibacterial soap and water prior to dressing change. Cleanser: Wound Cleanser 1 x Per Week/30 Days Discharge Instructions: Cleanse the wound with wound cleanser prior to applying a clean dressing using gauze sponges, not tissue or cotton balls. Peri-Wound Care: Triamcinolone 15 (g) 1 x Per Week/30 Days Discharge Instructions: to periwouind mixed with lotion Peri-Wound Care: Sween Lotion (Moisturizing lotion) 1 x Per Week/30 Days Discharge Instructions: Apply to periwound Prim Dressing: Hydrofera Blue Classic Foam, 2x2 in 1 x Per Week/30 Days ary Discharge Instructions: Moisten with saline prior to applying to wound bed Secondary Dressing: Woven Gauze Sponge, Non-Sterile 4x4 in 1 x Per Week/30 Days Discharge Instructions: Apply over primary dressing as directed. Secondary Dressing: ABD Pad, 8x10 1 x Per Week/30 Days Discharge Instructions: Apply over primary dressing as directed. Com pression Wrap: CoFlex TLC XL 2-layer Compression System 4x7 (in/yd) 1 x Per Week/30 Days Discharge Instructions: Apply CoFlex 2-layer compression as directed. (alt for 4 layer) 1. I continued with the Hydrofera Blue under compression. A bit puzzled to why this is not epithelializing. #2 MolecuLight studies suggested blush fluorescence to areas but outside the wound margin. There is seen very little of a clinical correlate here. I was very reluctant to debride in the normal tissue 3. 5-day course of doxycycline 100 twice daily related to #2. I did not think that topical antibiotics were going to be helpful MolecuLight DX: 1st Scanned Wound Fluorescence bacterial imaging was medically necessary today due to Wound previously healing as expected but has recently stalled (flattening of (Indication): the area/volume) MolecuLight Results Red Colors The indicated colors were noted in the following area(s). In the periphery of the wound As a result of  todays scan, the following treatment plans were put in place. Systemic antibiotics MolecuLight Procedure The MolecularLight DX device was cleaned with a disinfectant wipe prior to use., The correct patient profile was confirmed and correct wound was verified., Range finder sensor used to ensure appropriate distance selected The following was completed: between imaging unit and wound bed, Room lights were turned off and the ambient light sensor was checked., Blue circle appeared around the lightbulb., The fluorescence icon was selected. Screen was tapped to enhance focus and the image was captured. Additional drapes were used to ensure adequate darknesso No Potential ICD-10 Codes ICD-10 A49.9 Bacterial infection unspecified (Red/Blush/Yellow Color) Additional Scanned Wounds Did you scan any additional Woundso No Electronic Signature(s) Signed: 09/01/2021 4:56:16 PM By: Baltazar Najjar MD Entered By: Baltazar Najjar on 09/01/2021 14:56:18 -------------------------------------------------------------------------------- SuperBill Details Patient Name: Date of Service: TAKEKO, MCNICOL 09/01/2021 Medical Record Number: [id number] Patient Account Number: [id number] Date of Birth/Sex: Treating RN: 1941/11/09 (80 y.o. [name] Primary Care Provider: [name] Other Clinician: Referring Provider: Treating Provider/Extender: Angus Palms Weeks in Treatment: 15 Diagnosis Coding ICD-10 Codes Code Description [phone] Chronic venous hypertension (idiopathic) with ulcer and inflammation of left lower extremity L97.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 of skin Facility Procedures CPT4 Code: [id number] Description: (Facility Use Only) [phone] - APPLY MULTLAY COMPRS LWR LT LEG ICD-10 Diagnosis Description L97.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 of sk Modifier: in Quantity: 1 CPT4  Code: [id number] Description: 0598T NONCNTACT RT FLORO WND 1ST STE ICD-10 Diagnosis Description L97.821 Non-pressure chronic ulcer of other part of left lower  leg limited to breakdown of sk Modifier: in Quantity: 1 Physician Procedures : CPT4 Code Description Modifier [id number] - WC PHYS LEVEL 3 - EST PT 25 ICD-10 Diagnosis Description I87.332 Chronic venous hypertension (idiopathic) with ulcer and inflammation of left lower extremity L97.821 Non-pressure chronic ulcer of other  part of left lower leg limited to breakdown of skin Quantity: 1 : [phone] NONCNTACT RT FLORO WND 1ST STE ICD-10 Diagnosis Description L97.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 of skin Quantity: 1 Electronic Signature(s) Signed: 09/01/2021 4:56:16 PM By: Baltazar Najjar MD Entered By: Baltazar Najjar on 09/01/2021 14:57:05

## 2021-09-07 NOTE — Progress Notes (Signed)
[name], [name] ([id number]) Visit Report for 09/01/2021 Arrival Information Details Patient Name: Date of Service: Pam [name], Pam [name] 09/01/2021 1:45 PM Medical Record Number: [id number] Patient Account Number: [id number] Date of Birth/Sex: Treating RN: [birth year] (80 y.o. Pam [name] Primary Care Jovonte Commins: Donald Prose Other Clinician: Referring Nikayla Madaris: Treating Andera Cranmer/Extender: Charyl Dancer Weeks in Treatment: 15 Visit Information History Since Last Visit Added or deleted any medications: No Patient Arrived: Ambulatory Any new allergies or adverse reactions: No Arrival Time: 14:17 Had a fall or experienced change in No Accompanied By: self activities of daily living that may affect Transfer Assistance: None risk of falls: Patient Identification Verified: Yes Signs or symptoms of abuse/neglect since last visito No Secondary Verification Process Completed: Yes Hospitalized since last visit: No Patient Requires Transmission-Based Precautions: No Implantable device outside of the clinic excluding No Patient Has Alerts: No cellular tissue based products placed in the center since last visit: Has Dressing in Place as Prescribed: Yes Pain Present Now: No Electronic Signature(s) Signed: 09/07/2021 12:59:59 PM By: Sandre Kitty Entered By: Sandre Kitty on 09/01/2021 14:17:31 -------------------------------------------------------------------------------- Compression Therapy Details Patient Name: Date of Service: Pam [name], Pam [name] 09/01/2021 1:45 PM Medical Record Number: [id number] Patient Account Number: [id number] Date of Birth/Sex: Treating RN: 12/18/1940 (80 y.o. Pam [name] Primary Care Abimael Zeiter: Donald Prose Other Clinician: Referring Deegan Valentino: Treating Simmie Camerer/Extender: Charyl Dancer Weeks in Treatment: 15 Compression Therapy Performed for Wound Assessment: Wound #1 Left,Medial Lower Leg Performed By: Clinician Lorrin Jackson,  RN Compression Type: Double Layer Post Procedure Diagnosis Same as Pre-procedure Electronic Signature(s) Signed: 09/01/2021 5:07:25 PM By: Lorrin Jackson Entered By: Lorrin Jackson on 09/01/2021 14:45:42 -------------------------------------------------------------------------------- Encounter Discharge Information Details Patient Name: Date of Service: Pam [name], Pam [name] 09/01/2021 1:45 PM Medical Record Number: [id number] Patient Account Number: [id number] Date of Birth/Sex: Treating RN: 1941-04-19 (80 y.o. Pam [name] Primary Care Corinthian Mizrahi: Donald Prose Other Clinician: Referring Pam [name]: Treating Pam [name]/Extender: Charyl Dancer Weeks in Treatment: 15 Encounter Discharge Information Items Discharge Condition: Stable Ambulatory Status: Ambulatory Discharge Destination: Home Transportation: Private Auto Schedule Follow-up Appointment: Yes Clinical Summary of Care: Provided on 09/01/2021 Form Type Recipient Paper Patient Patient Electronic Signature(s) Signed: 09/01/2021 5:03:54 PM By: Lorrin Jackson Entered By: Lorrin Jackson on 09/01/2021 17:03:54 -------------------------------------------------------------------------------- Lower Extremity Assessment Details Patient Name: Date of Service: Pam [name], Pam [name] 09/01/2021 1:45 PM Medical Record Number: [id number] Patient Account Number: [id number] Date of Birth/Sex: Treating RN: April 05, 1941 (80 y.o. Pam [name] Primary Care Pam [name]: Donald Prose Other Clinician: Referring Essynce Munsch: Treating Ethridge Sollenberger/Extender: Charyl Dancer Weeks in Treatment: 15 Edema Assessment Assessed: Shirlyn Goltz: No] Patrice Paradise: No] Edema: [Left: Ye] [Right: s] Calf Left: Right: Point of Measurement: 35 cm From Medial Instep 31 cm Ankle Left: Right: Point of Measurement: 9 cm From Medial Instep 20 cm Vascular Assessment Pulses: Dorsalis Pedis Palpable: [Left:Yes] Electronic Signature(s) Signed: 09/01/2021 5:56:57 PM  By: Levan Hurst RN, BSN Signed: 09/07/2021 12:59:59 PM By: Sandre Kitty Entered By: Sandre Kitty on 09/01/2021 14:38:00 -------------------------------------------------------------------------------- Multi Wound Chart Details Patient Name: Date of Service: Pam [name], Pam [name] 09/01/2021 1:45 PM Medical Record Number: [id number] Patient Account Number: [id number] Date of Birth/Sex: Treating RN: 17-Aug-1941 (80 y.o. Pam [name] Primary Care Anaira Seay: Donald Prose Other Clinician: Referring Pam [name]: Treating Kemo Spruce/Extender: Charyl Dancer Weeks in Treatment: 15 Vital Signs Height(in): 64 Pulse(bpm): 70 Weight(lbs): 180 Blood Pressure(mmHg): 134/79 Body Mass Index(BMI): 31 Temperature(F): 97.9 Respiratory Rate(breaths/min): 16 Photos: [N/A:N/A] Left, Medial Lower Leg N/A N/A Wound Location: Gradually Appeared N/A N/A Wounding  Event: Venous Leg Ulcer N/A N/A Primary Etiology: Cataracts, Glaucoma, Hypertension, N/A N/A Comorbid History: Osteoarthritis 03/14/2021 N/A N/A Date Acquired: 15 N/A N/A Weeks of Treatment: Open N/A N/A Wound Status: 1.4x1.3x0.1 N/A N/A Measurements L x W x D (cm) 1.429 N/A N/A A (cm) : rea 0.143 N/A N/A Volume (cm) : 96.90% N/A N/A % Reduction in Area: 96.90% N/A N/A % Reduction in Volume: Full Thickness With Exposed Support N/A N/A Classification: Structures Medium N/A N/A Exudate Amount: Serosanguineous N/A N/A Exudate Type: red, brown N/A N/A Exudate Color: Distinct, outline attached N/A N/A Wound Margin: Large (67-100%) N/A N/A Granulation Amount: Red, Pink N/A N/A Granulation Quality: None Present (0%) N/A N/A Necrotic Amount: Fat Layer (Subcutaneous Tissue): Yes N/A N/A Exposed Structures: Fascia: No Tendon: No Muscle: No Joint: No Bone: No Medium (34-66%) N/A N/A Epithelialization: Compression Therapy N/A N/A Procedures Performed: Treatment Notes Electronic Signature(s) Signed:  09/01/2021 4:56:16 PM By: Linton Ham MD Signed: 09/01/2021 5:56:57 PM By: Levan Hurst RN, BSN Entered By: Linton Ham on 09/01/2021 14:48:30 -------------------------------------------------------------------------------- Multi-Disciplinary Care Plan Details Patient Name: Date of Service: Pam [name], Pam [name] 09/01/2021 1:45 PM Medical Record Number: [id number] Patient Account Number: [id number] Date of Birth/Sex: Treating RN: 31-Oct-1941 (80 y.o. Pam [name] Primary Care Carlisha Wisler: Donald Prose Other Clinician: Referring Nadezhda Pollitt: Treating Natajah Derderian/Extender: Charyl Dancer Weeks in Treatment: 15 Active Inactive Venous Leg Ulcer Nursing Diagnoses: Actual venous Insuffiency (use after diagnosis is confirmed) Goals: Patient will maintain optimal edema control Date Initiated: 05/14/2021 Target Resolution Date: 09/29/2021 Goal Status: Active Patient/caregiver will verbalize understanding of disease process and disease management Date Initiated: 05/14/2021 Date Inactivated: 06/09/2021 Target Resolution Date: 06/11/2021 Goal Status: Met Interventions: Assess peripheral edema status every visit. Compression as ordered Treatment Activities: Therapeutic compression applied : 05/14/2021 Notes: 09/01/21: Edema control ongoing Wound/Skin Impairment Nursing Diagnoses: Impaired tissue integrity Goals: Patient/caregiver will verbalize understanding of skin care regimen Date Initiated: 05/14/2021 Target Resolution Date: 09/29/2021 Goal Status: Active Ulcer/skin breakdown will have a volume reduction of 30% by week 4 Date Initiated: 05/14/2021 Date Inactivated: 06/16/2021 Target Resolution Date: 06/18/2021 Goal Status: Met Interventions: Assess patient/caregiver ability to obtain necessary supplies Assess patient/caregiver ability to perform ulcer/skin care regimen upon admission and as needed Assess ulceration(s) every visit Provide education on ulcer and skin care Treatment  Activities: Skin care regimen initiated : 05/14/2021 Topical wound management initiated : 05/14/2021 Notes: Electronic Signature(s) Signed: 09/01/2021 5:07:25 PM By: Lorrin Jackson Entered By: Lorrin Jackson on 09/01/2021 14:40:19 -------------------------------------------------------------------------------- Pain Assessment Details Patient Name: Date of Service: MIXTLI, RENO 09/01/2021 1:45 PM Medical Record Number: [id number] Patient Account Number: [id number] Date of Birth/Sex: Treating RN: 10-Nov-1941 (80 y.o. Pam [name] Primary Care Kem Hensen: Donald Prose Other Clinician: Referring Jerard Bays: Treating Burnetta Kohls/Extender: Charyl Dancer Weeks in Treatment: 15 Active Problems Location of Pain Severity and Description of Pain Patient Has Paino No Site Locations Pain Management and Medication Current Pain Management: Electronic Signature(s) Signed: 09/01/2021 5:56:57 PM By: Levan Hurst RN, BSN Signed: 09/07/2021 12:59:59 PM By: Sandre Kitty Entered By: Sandre Kitty on 09/01/2021 14:18:06 -------------------------------------------------------------------------------- Patient/Caregiver Education Details Patient Name: Date of Service: ELLYSON, RARICK 9/21/2022andnbsp1:45 PM Medical Record Number: [id number] Patient Account Number: [id number] Date of Birth/Gender: Treating RN: 20-Feb-1941 (80 y.o. Pam [name] Primary Care Physician: Donald Prose Other Clinician: Referring Physician: Treating Physician/Extender: Gwendalyn Ege in Treatment: 15 Education Assessment Education Provided To: Patient Education Topics Provided Venous: Methods: Explain/Verbal, Printed Responses: State content correctly Wound/Skin Impairment: Methods: Explain/Verbal, Printed  Responses: State content correctly Electronic Signature(s) Signed: 09/01/2021 5:07:25 PM By: Lorrin Jackson Entered By: Lorrin Jackson on 09/01/2021  14:47:20 -------------------------------------------------------------------------------- Wound Assessment Details Patient Name: Date of Service: Pam [name], Pam [name] 09/01/2021 1:45 PM Medical Record Number: [id number] Patient Account Number: [id number] Date of Birth/Sex: Treating RN: Apr 23, 1941 (80 y.o. Pam [name] Primary Care Lamari Beckles: Donald Prose Other Clinician: Referring Jeremiah Curci: Treating Finn Altemose/Extender: Charyl Dancer Weeks in Treatment: 15 Wound Status Wound Number: 1 Primary Etiology: Venous Leg Ulcer Wound Location: Left, Medial Lower Leg Wound Status: Open Wounding Event: Gradually Appeared Comorbid History: Cataracts, Glaucoma, Hypertension, Osteoarthritis Date Acquired: 03/14/2021 Weeks Of Treatment: 15 Clustered Wound: No Photos Wound Measurements Length: (cm) 1.4 Width: (cm) 1.3 Depth: (cm) 0.1 Area: (cm) 1.429 Volume: (cm) 0.143 % Reduction in Area: 96.9% % Reduction in Volume: 96.9% Epithelialization: Medium (34-66%) Wound Description Classification: Full Thickness With Exposed Support Structures Wound Margin: Distinct, outline attached Exudate Amount: Medium Exudate Type: Serosanguineous Exudate Color: red, brown Foul Odor After Cleansing: No Slough/Fibrino No Wound Bed Granulation Amount: Large (67-100%) Exposed Structure Granulation Quality: Red, Pink Fascia Exposed: No Necrotic Amount: None Present (0%) Fat Layer (Subcutaneous Tissue) Exposed: Yes Tendon Exposed: No Muscle Exposed: No Joint Exposed: No Bone Exposed: No Treatment Notes Wound #1 (Lower Leg) Wound Laterality: Left, Medial Cleanser Soap and Water Discharge Instruction: May shower and wash wound with dial antibacterial soap and water prior to dressing change. Wound Cleanser Discharge Instruction: Cleanse the wound with wound cleanser prior to applying a clean dressing using gauze sponges, not tissue or cotton balls. Peri-Wound Care Triamcinolone 15  (g) Discharge Instruction: to periwouind mixed with lotion Sween Lotion (Moisturizing lotion) Discharge Instruction: Apply to periwound Topical Primary Dressing Hydrofera Blue Classic Foam, 2x2 in Discharge Instruction: Moisten with saline prior to applying to wound bed Secondary Dressing Woven Gauze Sponge, Non-Sterile 4x4 in Discharge Instruction: Apply over primary dressing as directed. ABD Pad, 8x10 Discharge Instruction: Apply over primary dressing as directed. Secured With Compression Wrap CoFlex TLC XL 2-layer Compression System 4x7 (in/yd) Discharge Instruction: Apply CoFlex 2-layer compression as directed. (alt for 4 layer) Compression Stockings Add-Ons Electronic Signature(s) Signed: 09/01/2021 3:09:32 PM By: Lorrin Jackson Signed: 09/01/2021 5:56:57 PM By: Levan Hurst RN, BSN Entered By: Lorrin Jackson on 09/01/2021 15:09:31 -------------------------------------------------------------------------------- [place] Details Patient Name: Date of Service: Pam [name], Pam [name] 09/01/2021 1:45 PM Medical Record Number: [id number] Patient Account Number: [id number] Date of Birth/Sex: Treating RN: 1941-04-20 (80 y.o. Pam [name] Primary Care Lynlee Stratton: Donald Prose Other Clinician: Referring Shawanna Zanders: Treating Sulaiman Imbert/Extender: Charyl Dancer Weeks in Treatment: 15 Vital Signs Time Taken: 14:18 Temperature (F): 97.9 Height (in): 64 Pulse (bpm): 70 Weight (lbs): 180 Respiratory Rate (breaths/min): 16 Body Mass Index (BMI): 30.9 Blood Pressure (mmHg): 134/79 Reference Range: 80 - 120 mg / dl Electronic Signature(s) Signed: 09/07/2021 12:59:59 PM By: Sandre Kitty Entered By: Sandre Kitty on 09/01/2021 14:19:39

## 2021-09-08 ENCOUNTER — Encounter (HOSPITAL_BASED_OUTPATIENT_CLINIC_OR_DEPARTMENT_OTHER): Payer: Medicare PPO | Admitting: Internal Medicine

## 2021-09-08 ENCOUNTER — Other Ambulatory Visit: Payer: Self-pay

## 2021-09-08 DIAGNOSIS — I87332 Chronic venous hypertension (idiopathic) with ulcer and inflammation of left lower extremity: Secondary | ICD-10-CM | POA: Diagnosis not present

## 2021-09-09 NOTE — Progress Notes (Signed)
Pam [name], Pam [name] ([id number]) Visit Report for 09/08/2021 Debridement Details Patient Name: Date of Service: Pam [name], Pam [name] 09/08/2021 2:00 PM Medical Record Number: [id number] Patient Account Number: [id number] Date of Birth/Sex: Treating RN: 04-25-1941 (80 y.o. [name] Primary Care Provider: [name] Other Clinician: Referring Provider: Treating Provider/Extender: Angus Palms Weeks in Treatment: 16 Debridement Performed for Assessment: Wound #1 Left,Medial Lower Leg Performed By: Physician Maxwell Caul., MD Debridement Type: Debridement Severity of Tissue Pre Debridement: Fat layer exposed Level of Consciousness (Pre-procedure): Awake and Alert Pre-procedure Verification/Time Out Yes - 14:50 Taken: Start Time: 14:50 T Area Debrided (L x W): otal 1 (cm) x 0.8 (cm) = 0.8 (cm) Tissue and other material debrided: Viable, Non-Viable, Subcutaneous, Biofilm Level: Skin/Subcutaneous Tissue Debridement Description: Excisional Instrument: Curette Bleeding: Minimum Hemostasis Achieved: Pressure End Time: 14:51 Procedural Pain: 0 Post Procedural Pain: 0 Response to Treatment: Procedure was tolerated well Level of Consciousness (Post- Awake and Alert procedure): Post Debridement Measurements of Total Wound Length: (cm) 1 Width: (cm) 0.8 Depth: (cm) 0.1 Volume: (cm) 0.063 Character of Wound/Ulcer Post Debridement: Improved Severity of Tissue Post Debridement: Fat layer exposed Post Procedure Diagnosis Same as Pre-procedure Electronic Signature(s) Signed: 09/08/2021 5:00:15 PM By: Baltazar Najjar MD Signed: 09/09/2021 4:49:13 PM By: Zandra Abts RN, BSN Entered By: Baltazar Najjar on 09/08/2021 14:59:07 -------------------------------------------------------------------------------- HPI Details Patient Name: Date of Service: Pam [name], Pam [name] 09/08/2021 2:00 PM Medical Record Number: [id number] Patient Account Number: [id number] Date of Birth/Sex:  Treating RN: 1941-02-25 (80 y.o. [name] Primary Care Provider: [name] Other Clinician: Referring Provider: Treating Provider/Extender: Angus Palms Weeks in Treatment: 16 History of Present Illness HPI Description: ADMISSION 05/14/2021 with This is a 80 year old reasonably healthy woman who is still working at [employer] and about to retire at the end of the month. She has had a problem with lower extremity edema for about the last 6 months she tells me. 2 months ago she noted small bumps on both of her legs however the left one medially morphed into a large blister. She was seen by her primary physician Dr. [name] on 04/19/2021. Put on doxycycline. He noted at that time that the wound had been weeping for the last 5 weeks. The patient does not have a wound history. 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 We used Iodoflex under compression last week. 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 We have been using Iodoflex to clean up the wound surface. She came in with drainage. 6/24; wound on the left medial lower leg. Using Iodoflex to clean up the wound surface. We are making gradual progress with that. Nursing reported an odor and drainage. 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 Mentioning this because I would like to see about Apligraf 6/29; left medial lower leg using Iodoflex to date. Making gradual improvement with a nonviable surface. 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 Close to ordering  Apligraf through her insuranceo Next week 7/6; still have not heard about Apligraf through her insurance. She  is making gradual improvement in the condition of this wound bed. She is retired and is now under [employer]. We have been using Hydrofera Blue under compression This is a fairly large wound. I have suspected that this was a venous insufficiency ulcer. As it is not really improving with standard therapy I have elected to go ahead and order venous reflux studies today 7/13; she is approved for Apligraf we will order that for next week. She continues to make decent improvements slightly smaller today surface looks better we have been using Hydrofera Blue under compression. She has her venous reflux studies next 7/20; Apligraf #1 07/14/2021 upon evaluation today patient appears to be doing excellent in regard to her wound. This is actually the first time I am seeing her. With that being said she does have good progress and improvement noted based on what I am seeing today and this is actually a progression #2 a for Apligraf. 8/17; improvement Apligraf #3. No concerns stated by the patient 8/31 Apligraf #4 very superficial now healthy granulation and smaller dimension 9/14; arrives with the wound having no depth and measurements of 1 x 0.8 cm. Given the very small nature and the healthy looking wound surface I did not feel it was worth putting Apligraf #5 on. Switched her to [facility] still under the same compression 9/21; surprisingly the patient arrives in clinic with the area actually is slightly bigger. Under illumination the surface of the wound looks healthy. I switched her to [facility] last week. Dimensions this week at 1.4 x 1.3 x 0.1 9/28; measuring slightly smaller using Hydrofera Blue. Under illumination the wound bed looks healthy Electronic Signature(s) Signed: 09/08/2021 5:00:15 PM By: Baltazar Najjar MD Entered By: Baltazar Najjar on 09/08/2021  15:00:04 -------------------------------------------------------------------------------- Physical Exam Details Patient Name: Date of Service: Pam [name], Pam [name] 09/08/2021 2:00 PM Medical Record Number: [id number] Patient Account Number: [id number] Date of Birth/Sex: Treating RN: 1941-02-15 (80 y.o. [name] Primary Care Provider: [name] Other Clinician: Referring Provider: Treating Provider/Extender: Angus Palms Weeks in Treatment: 16 Constitutional Sitting or standing Blood Pressure is within target range for patient.. Pulse regular and within target range for patient.Marland Kitchen Respirations regular, non-labored and within target range.. Temperature is normal and within the target range for the patient.Marland Kitchen Appears in no distress. Notes Wound exam; left medial calf healthy looking even under illumination but given the stagnation of the wound in the last 2 weeks I used a #3 curette I removed some dry skin from around the margins and a very fibrinous gritty surface from the wound with using a #3 curette hemostasis with direct pressure Electronic Signature(s) Signed: 09/08/2021 5:00:15 PM By: Baltazar Najjar MD Entered By: Baltazar Najjar on 09/08/2021 15:00:48 -------------------------------------------------------------------------------- Physician Orders Details Patient Name: Date of Service: Pam [name], Pam [name] 09/08/2021 2:00 PM Medical Record Number: [id number] Patient Account Number: [id number] Date of Birth/Sex: Treating RN: 1941-07-24 (80 y.o. [name] Primary Care Provider: [name] Other Clinician: Referring Provider: Treating Provider/Extender: Angus Palms Weeks in Treatment: 16 Verbal / Phone Orders: No Diagnosis Coding ICD-10 Coding Code Description [phone] Chronic venous hypertension (idiopathic) with ulcer and inflammation of left lower extremity L97.821 Non-pressure chronic ulcer of other part of left lower leg limited to  breakdown of skin Follow-up Appointments ppointment in 1 week. - with Dr. Leanord Hawking Return A Bathing/ Shower/ Hygiene May shower with protection but do not get wound dressing(s) wet. Edema Control - Lymphedema / SCD / Other Elevate legs to the  level of the heart or above for 30 minutes daily and/or when sitting, a frequency of: - throughout the day Avoid standing for long periods of time. Exercise regularly Additional Orders / Instructions Follow Nutritious Diet Wound Treatment Wound #1 - Lower Leg Wound Laterality: Left, Medial Cleanser: Soap and Water 1 x Per Week/30 Days Discharge Instructions: May shower and wash wound with dial antibacterial soap and water prior to dressing change. Cleanser: Wound Cleanser 1 x Per Week/30 Days Discharge Instructions: Cleanse the wound with wound cleanser prior to applying a clean dressing using gauze sponges, not tissue or cotton balls. Peri-Wound Care: Triamcinolone 15 (g) 1 x Per Week/30 Days Discharge Instructions: to periwouind mixed with lotion Peri-Wound Care: Sween Lotion (Moisturizing lotion) 1 x Per Week/30 Days Discharge Instructions: Apply to periwound Prim Dressing: Hydrofera Blue Classic Foam, 2x2 in 1 x Per Week/30 Days ary Discharge Instructions: Moisten with saline prior to applying to wound bed Secondary Dressing: Woven Gauze Sponge, Non-Sterile 4x4 in 1 x Per Week/30 Days Discharge Instructions: Apply over primary dressing as directed. Compression Wrap: CoFlex TLC XL 2-layer Compression System 4x7 (in/yd) 1 x Per Week/30 Days Discharge Instructions: Apply CoFlex 2-layer compression as directed. (alt for 4 layer) Electronic Signature(s) Signed: 09/08/2021 5:00:15 PM By: Baltazar Najjar MD Signed: 09/09/2021 4:49:13 PM By: Zandra Abts RN, BSN Entered By: Zandra Abts on 09/08/2021 14:54:55 -------------------------------------------------------------------------------- Problem List Details Patient Name: Date of  Service: Pam [name], Pam [name] 09/08/2021 2:00 PM Medical Record Number: [id number] Patient Account Number: [id number] Date of Birth/Sex: Treating RN: 1941/08/17 (80 y.o. [name] Primary Care Provider: [name] Other Clinician: Referring Provider: Treating Provider/Extender: Angus Palms Weeks in Treatment: 16 Active Problems ICD-10 Encounter Code Description Active Date MDM Diagnosis I87.332 Chronic venous hypertension (idiopathic) with ulcer and inflammation of left 05/14/2021 No Yes lower extremity L97.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 05/14/2021 No Yes of skin Inactive Problems Resolved Problems Electronic Signature(s) Signed: 09/08/2021 5:00:15 PM By: Baltazar Najjar MD Entered By: Baltazar Najjar on 09/08/2021 14:58:40 -------------------------------------------------------------------------------- Progress Note Details Patient Name: Date of Service: Pam [name] 09/08/2021 2:00 PM Medical Record Number: [id number] Patient Account Number: [id number] Date of Birth/Sex: Treating RN: August 09, 1941 (80 y.o. [name] Primary Care Provider: [name] Other Clinician: Referring Provider: Treating Provider/Extender: Angus Palms Weeks in Treatment: 16 Subjective History of Present Illness (HPI) ADMISSION 05/14/2021 with This is a 80 year old reasonably healthy woman who is still working at [employer] and about to retire at the end of the month. She has had a problem with lower extremity edema for about the last 6 months she tells me. 2 months ago she noted small bumps on both of her legs however the left one medially morphed into a large blister. She was seen by her primary physician Dr. [name] on 04/19/2021. Put on doxycycline. He noted at that time that the wound had been weeping for the last 5 weeks. The patient does not have a wound history. 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 We used Iodoflex under compression last week. She does not appear to have an arterial issue 6/17; this is a patient with a venous insufficiency ulcer on the  left medial lower leg. We have been using Iodoflex to clean up the wound surface. She came in with drainage. 6/24; wound on the left medial lower leg. Using Iodoflex to clean up the wound surface. We are making gradual progress with that. Nursing reported an odor and drainage. 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 Mentioning this because I would like to see about Apligraf 6/29; left medial lower leg using Iodoflex to date. Making gradual improvement with a nonviable surface. 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 Close to ordering Apligraf through her insuranceo Next week 7/6; still have not heard about Apligraf through her insurance. She is making gradual improvement in the condition of this wound bed. She is retired and is now under [employer]. We have been using Hydrofera Blue under compression This is a fairly large wound. I have suspected that this was a venous insufficiency ulcer. As it is not really improving with standard therapy I have elected to go ahead and order venous reflux studies today 7/13; she is approved for Apligraf we will order that for next week. She continues to make decent improvements slightly smaller today surface looks better we have been using Hydrofera Blue under compression. She has her venous reflux studies next 7/20; Apligraf #1 07/14/2021 upon evaluation today patient appears to be doing excellent in regard to her wound. This is actually the  first time I am seeing her. With that being said she does have good progress and improvement noted based on what I am seeing today and this is actually a progression #2 a for Apligraf. 8/17; improvement Apligraf #3. No concerns stated by the patient 8/31 Apligraf #4 very superficial now healthy granulation and smaller dimension 9/14; arrives with the wound having no depth and measurements of 1 x 0.8 cm. Given the very small nature and the healthy looking wound surface I did not feel it was worth putting Apligraf #5 on. Switched her to [facility] still under the same compression 9/21; surprisingly the patient arrives in clinic with the area actually is slightly bigger. Under illumination the surface of the wound looks healthy. I switched her to [facility] last week. Dimensions this week at 1.4 x 1.3 x 0.1 9/28; measuring slightly smaller using Hydrofera Blue. Under illumination the wound bed looks healthy Objective Constitutional Sitting or standing Blood Pressure is within target range for patient.. Pulse regular and within target range for patient.Marland Kitchen Respirations regular, non-labored and within target range.. Temperature is normal and within the target range for the patient.Marland Kitchen Appears in no distress. Vitals Time Taken: 2:19 PM, Height: 64 in, Weight: 180 lbs, BMI: 30.9, Temperature: 98.0 F, Pulse: 71 bpm, Respiratory Rate: 16 breaths/min, Blood Pressure: 135/81 mmHg. General Notes: Wound exam; left medial calf healthy looking even under illumination but given the stagnation of the wound in the last 2 weeks I used a #3 curette I removed some dry skin from around the margins and a very fibrinous gritty surface from the wound with using a #3 curette hemostasis with direct pressure Integumentary (Hair, Skin) Wound #1 status is Open. Original cause of wound was Gradually Appeared. The date acquired was: 03/14/2021. The wound has been in treatment 16 weeks. The wound is located on the  Left,Medial Lower Leg. The wound measures 1cm length x 0.8cm width x 0.1cm depth; 0.628cm^2 area and 0.063cm^3 volume. There is Fat Layer (Subcutaneous Tissue) exposed. There is  no tunneling or undermining noted. There is a medium amount of serosanguineous drainage noted. The wound margin is distinct with the outline attached to the wound base. There is large (67-100%) red, pink granulation within the wound bed. There is no necrotic tissue within the wound bed. 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 There was a Excisional Skin/Subcutaneous Tissue Debridement with a total area of 0.8 sq cm performed by Maxwell Caul., MD. With the following instrument(s): Curette to remove Viable and Non-Viable tissue/material. Material removed includes Subcutaneous Tissue and Biofilm and. No specimens were taken. A time out was conducted at 14:50, prior to the start of the procedure. A Minimum amount of bleeding was controlled with Pressure. The procedure was tolerated well with a pain level of 0 throughout and a pain level of 0 following the procedure. Post Debridement Measurements: 1cm length x 0.8cm width x 0.1cm depth; 0.063cm^3 volume. Character of Wound/Ulcer Post Debridement is improved. Severity of Tissue Post Debridement is: Fat layer exposed. Post procedure Diagnosis Wound #1: Same as Pre-Procedure Pre-procedure diagnosis of Wound #1 is a Venous Leg Ulcer located on the Left,Medial Lower Leg . There was a Double Layer Compression Therapy Procedure by Zandra Abts, RN. Post procedure Diagnosis Wound #1: Same as Pre-Procedure Plan Follow-up  Appointments: Return Appointment in 1 week. - with Dr. Chauncey Mann Shower/ Hygiene: May shower with protection but do not get wound dressing(s) wet. Edema Control - Lymphedema / SCD / Other: Elevate legs to the level of the heart or above for 30 minutes daily and/or when sitting, a frequency of: - throughout the day Avoid standing for long periods of time. Exercise regularly Additional Orders / Instructions: Follow Nutritious Diet WOUND #1: - Lower Leg Wound Laterality: Left, Medial Cleanser: Soap and Water 1 x Per Week/30 Days Discharge Instructions: May shower and wash wound with dial antibacterial soap and water prior to dressing change. Cleanser: Wound Cleanser 1 x Per Week/30 Days Discharge Instructions: Cleanse the wound with wound cleanser prior to applying a clean dressing using gauze sponges, not tissue or cotton balls. Peri-Wound Care: Triamcinolone 15 (g) 1 x Per Week/30 Days Discharge Instructions: to periwouind mixed with lotion Peri-Wound Care: Sween Lotion (Moisturizing lotion) 1 x Per Week/30 Days Discharge Instructions: Apply to periwound Prim Dressing: Hydrofera Blue Classic Foam, 2x2 in 1 x Per Week/30 Days ary Discharge Instructions: Moisten with saline prior to applying to wound bed Secondary Dressing: Woven Gauze Sponge, Non-Sterile 4x4 in 1 x Per Week/30 Days Discharge Instructions: Apply over primary dressing as directed. Com pression Wrap: CoFlex TLC XL 2-layer Compression System 4x7 (in/yd) 1 x Per Week/30 Days Discharge Instructions: Apply CoFlex 2-layer compression as directed. (alt for 4 layer) 1. A fairly aggressive debridement for reasons as noted. I am hopeful that this will allow final epithelialization 2. Still Hydrofera Blue under compression Electronic Signature(s) Signed: 09/08/2021 5:00:15 PM By: Baltazar Najjar MD Entered By: Baltazar Najjar on 09/08/2021  15:02:29 -------------------------------------------------------------------------------- SuperBill Details Patient Name: Date of Service: Pam [name], Pam [name] 09/08/2021 Medical Record Number: [id number] Patient Account Number: [id number] Date of Birth/Sex: Treating RN: Jun 13, 1941 (80 y.o. [name] Primary Care Provider: [name] Other Clinician: Referring Provider: Treating Provider/Extender: Leanord Hawking  York Grice, Charise Carwin Weeks in Treatment: 16 Diagnosis Coding ICD-10 Codes Code Description I87.332 Chronic venous hypertension (idiopathic) with ulcer and inflammation of left lower extremity L97.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 of skin Facility Procedures CPT4 Code: [id number] Description: 11042 - DEB SUBQ TISSUE 20 SQ CM/< ICD-10 Diagnosis Description L97.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 Modifier: of skin Quantity: 1 Physician Procedures : CPT4 Code Description Modifier [id number] 11042 - WC PHYS SUBQ TISS 20 SQ CM ICD-10 Diagnosis Description L97.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 of skin Quantity: 1 Electronic Signature(s) Signed: 09/08/2021 5:00:15 PM By: Baltazar Najjar MD Entered By: Baltazar Najjar on 09/08/2021 15:02:41

## 2021-09-09 NOTE — Progress Notes (Signed)
Pam [name] ([id number]) Visit Report for 09/08/2021 Arrival Information Details Patient Name: Date of Service: Pam [name], Pam [name] 09/08/2021 2:00 PM Medical Record Number: [id number] Patient Account Number: [id number] Date of Birth/Sex: Treating RN: [birth year] (80 y.o. [name] Primary Care Abanoub Hanken: Donald Prose Other Clinician: Referring Kacie Huxtable: Treating Charlcie Prisco/Extender: Charyl Dancer Weeks in Treatment: 24 Visit Information History Since Last Visit Added or deleted any medications: No Patient Arrived: Ambulatory Any new allergies or adverse reactions: No Arrival Time: 14:17 Had a fall or experienced change in No Accompanied By: self activities of daily living that may affect Transfer Assistance: None risk of falls: Patient Identification Verified: Yes Signs or symptoms of abuse/neglect since last visito No Secondary Verification Process Completed: Yes Hospitalized since last visit: No Patient Requires Transmission-Based Precautions: No Implantable device outside of the clinic excluding No Patient Has Alerts: No cellular tissue based products placed in the center since last visit: Has Dressing in Place as Prescribed: Yes Pain Present Now: No Electronic Signature(s) Signed: 09/08/2021 4:47:29 PM By: Sandre Kitty Entered By: Sandre Kitty on 09/08/2021 14:19:17 -------------------------------------------------------------------------------- Compression Therapy Details Patient Name: Date of Service: Pam [name], Pam [name] 09/08/2021 2:00 PM Medical Record Number: [id number] Patient Account Number: [id number] Date of Birth/Sex: Treating RN: 05-05-1941 (80 y.o. [name] Primary Care Shiraz Bastyr: Donald Prose Other Clinician: Referring Shay Bartoli: Treating Afsheen Antony/Extender: Charyl Dancer Weeks in Treatment: 16 Compression Therapy Performed for Wound Assessment: Wound #1 Left,Medial Lower Leg Performed By: Clinician Levan Hurst,  RN Compression Type: Double Layer Post Procedure Diagnosis Same as Pre-procedure Electronic Signature(s) Signed: 09/09/2021 4:49:13 PM By: Levan Hurst RN, BSN Entered By: Levan Hurst on 09/08/2021 14:52:06 -------------------------------------------------------------------------------- Encounter Discharge Information Details Patient Name: Date of Service: Pam [name] [name] 09/08/2021 2:00 PM Medical Record Number: [id number] Patient Account Number: [id number] Date of Birth/Sex: Treating RN: July 11, 1941 (80 y.o. [name] Primary Care Shyanna Klingel: Donald Prose Other Clinician: Referring Lashanna Angelo: Treating Webb Weed/Extender: Charyl Dancer Weeks in Treatment: 16 Encounter Discharge Information Items Post Procedure Vitals Discharge Condition: Stable Temperature (F): 98.0 Ambulatory Status: Ambulatory Pulse (bpm): 71 Discharge Destination: Home Respiratory Rate (breaths/min): 16 Transportation: Private Auto Blood Pressure (mmHg): 135/81 Accompanied By: alone Schedule Follow-up Appointment: Yes Clinical Summary of Care: Patient Declined Electronic Signature(s) Signed: 09/09/2021 4:49:13 PM By: Levan Hurst RN, BSN Entered By: Levan Hurst on 09/09/2021 09:13:30 -------------------------------------------------------------------------------- Lower Extremity Assessment Details Patient Name: Date of Service: Pam [name], Pam [name] 09/08/2021 2:00 PM Medical Record Number: [id number] Patient Account Number: [id number] Date of Birth/Sex: Treating RN: 1941/08/14 (80 y.o. [name] Primary Care Raynard Mapps: Donald Prose Other Clinician: Referring Tekeshia Klahr: Treating Zaiah Eckerson/Extender: Charyl Dancer Weeks in Treatment: 16 Edema Assessment Assessed: Shirlyn Goltz: No] Patrice Paradise: No] Edema: [Left: Ye] [Right: s] Calf Left: Right: Point of Measurement: 35 cm From Medial Instep 31 cm Ankle Left: Right: Point of Measurement: 9 cm From Medial Instep 22  cm Vascular Assessment Pulses: Dorsalis Pedis Palpable: [Left:Yes] Electronic Signature(s) Signed: 09/09/2021 4:49:13 PM By: Levan Hurst RN, BSN Entered By: Levan Hurst on 09/08/2021 14:39:45 -------------------------------------------------------------------------------- Multi Wound Chart Details Patient Name: Date of Service: Pam [name] 09/08/2021 2:00 PM Medical Record Number: [id number] Patient Account Number: [id number] Date of Birth/Sex: Treating RN: [birth year] (80 y.o. [name] Primary Care Tyshay Adee: Donald Prose Other Clinician: Referring Ronal Maybury: Treating Jaleal Schliep/Extender: Charyl Dancer Weeks in Treatment: 16 Vital Signs Height(in): 64 Pulse(bpm): 63 Weight(lbs): 180 Blood Pressure(mmHg): 135/81 Body Mass Index(BMI): 31 Temperature(F): 98.0 Respiratory Rate(breaths/min): 16 Photos: [N/A:N/A] Left, Medial Lower  Leg N/A N/A Wound Location: Gradually Appeared N/A N/A Wounding Event: Venous Leg Ulcer N/A N/A Primary Etiology: Cataracts, Glaucoma, Hypertension, N/A N/A Comorbid History: Osteoarthritis 03/14/2021 N/A N/A Date Acquired: 16 N/A N/A Weeks of Treatment: Open N/A N/A Wound Status: 1x0.8x0.1 N/A N/A Measurements L x W x D (cm) 0.628 N/A N/A A (cm) : rea 0.063 N/A N/A Volume (cm) : 98.60% N/A N/A % Reduction in A rea: 98.60% N/A N/A % Reduction in Volume: Full Thickness With Exposed Support N/A N/A Classification: Structures Medium N/A N/A Exudate A mount: Serosanguineous N/A N/A Exudate Type: red, brown N/A N/A Exudate Color: Distinct, outline attached N/A N/A Wound Margin: Large (67-100%) N/A N/A Granulation A mount: Red, Pink N/A N/A Granulation Quality: None Present (0%) N/A N/A Necrotic A mount: Fat Layer (Subcutaneous Tissue): Yes N/A N/A Exposed Structures: Fascia: No Tendon: No Muscle: No Joint: No Bone: No Medium (34-66%) N/A N/A Epithelialization: Debridement - Excisional N/A  N/A Debridement: Pre-procedure Verification/Time Out 14:50 N/A N/A Taken: Subcutaneous N/A N/A Tissue Debrided: Skin/Subcutaneous Tissue N/A N/A Level: 0.8 N/A N/A Debridement A (sq cm): rea Curette N/A N/A Instrument: Minimum N/A N/A Bleeding: Pressure N/A N/A Hemostasis A chieved: 0 N/A N/A Procedural Pain: 0 N/A N/A Post Procedural Pain: Procedure was tolerated well N/A N/A Debridement Treatment Response: 1x0.8x0.1 N/A N/A Post Debridement Measurements L x W x D (cm) 0.063 N/A N/A Post Debridement Volume: (cm) Compression Therapy N/A N/A Procedures Performed: Debridement Treatment Notes Electronic Signature(s) Signed: 09/08/2021 5:00:15 PM By: Linton Ham MD Signed: 09/09/2021 4:49:13 PM By: Levan Hurst RN, BSN Entered By: Linton Ham on 09/08/2021 14:58:56 -------------------------------------------------------------------------------- Multi-Disciplinary Care Plan Details Patient Name: Date of Service: Pam [name], Pam [name] 09/08/2021 2:00 PM Medical Record Number: [id number] Patient Account Number: [id number] Date of Birth/Sex: Treating RN: [birth year] (80 y.o. 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 Compression as ordered Treatment Activities: Therapeutic compression applied : 05/14/2021 Notes: 09/01/21: Edema control  ongoing Wound/Skin Impairment Nursing Diagnoses: Impaired tissue integrity Goals: Patient/caregiver will verbalize understanding of skin care regimen Date Initiated: 05/14/2021 Target Resolution Date: 09/29/2021 Goal Status: Active Ulcer/skin breakdown will have a volume reduction of 30% by week 4 Date Initiated: 05/14/2021 Date Inactivated: 06/16/2021 Target Resolution Date: 06/18/2021 Goal Status: Met Interventions: Assess patient/caregiver ability to obtain necessary supplies Assess patient/caregiver ability to perform ulcer/skin care regimen upon admission and as needed Assess ulceration(s) every visit Provide education on ulcer and skin care Treatment Activities: Skin care regimen initiated : 05/14/2021 Topical wound management initiated : 05/14/2021 Notes: Electronic Signature(s) Signed: 09/09/2021 4:49:13 PM By: Levan Hurst RN, BSN Entered By: Levan Hurst on 09/08/2021 14:48:37 -------------------------------------------------------------------------------- Pain Assessment Details Patient Name: Date of Service: SADAKO, Pam [name] 09/08/2021 2:00 PM Medical Record Number: [id number] Patient Account Number: [id number] Date of Birth/Sex: Treating RN: 12-28-1940 (80 y.o. [name] Primary Care Thy Gullikson: Donald Prose Other Clinician: Referring Zaakirah Kistner: Treating Laquitta Dominski/Extender: Charyl Dancer Weeks in Treatment: 16 Active Problems Location of Pain Severity and Description of Pain Patient Has Paino No Site Locations Pain Management and Medication Current  Pain Management: Electronic Signature(s) Signed: 09/08/2021 4:47:29 PM By: Sandre Kitty Signed: 09/09/2021 4:49:13 PM By: Levan Hurst RN, BSN Entered By: Sandre Kitty on 09/08/2021 14:19:52 -------------------------------------------------------------------------------- Patient/Caregiver Education Details Patient Name: Date of Service: SAIDA, LONON 9/28/2022andnbsp2:00 PM Medical Record  Number: [id number] Patient Account Number: [id number] Date of Birth/Gender: Treating RN: 1941/07/02 (80 y.o. [name] Primary Care Physician: Donald Prose Other Clinician: Referring Physician: Treating Physician/Extender: Gwendalyn Ege in Treatment: 16 Education Assessment Education Provided To: Patient Education Topics Provided Wound/Skin Impairment: Methods: Explain/Verbal Responses: State content correctly [employer]) Signed: 09/09/2021 4:49:13 PM By: Levan Hurst RN, BSN Entered By: Levan Hurst on 09/08/2021 14:48:49 -------------------------------------------------------------------------------- Wound Assessment Details Patient Name: Date of Service: Pam [name], Pam [name] 09/08/2021 2:00 PM Medical Record Number: [id number] Patient Account Number: [id number] Date of Birth/Sex: Treating RN: 11-05-1941 (80 y.o. [name] Primary Care Ahman Dugdale: Donald Prose Other Clinician: Referring Brendan Gruwell: Treating Joe Gee/Extender: Charyl Dancer Weeks in Treatment: 16 Wound Status Wound Number: 1 Primary Etiology: Venous Leg Ulcer Wound Location: Left, Medial Lower Leg Wound Status: Open Wounding Event: Gradually Appeared Comorbid History: Cataracts, Glaucoma, Hypertension, Osteoarthritis Date Acquired: 03/14/2021 Weeks Of Treatment: 16 Clustered Wound: No Photos Wound Measurements Length: (cm) 1 Width: (cm) 0.8 Depth: (cm) 0.1 Area: (cm) 0.628 Volume: (cm) 0.063 % Reduction in Area: 98.6% % Reduction in Volume: 98.6% Epithelialization: Medium (34-66%) Tunneling: No Undermining: No Wound Description Classification: Full Thickness With Exposed Support Structures Wound Margin: Distinct, outline attached Exudate Amount: Medium Exudate Type: Serosanguineous Exudate Color: red, brown Foul Odor After Cleansing: No Slough/Fibrino No Wound Bed Granulation Amount: Large (67-100%) Exposed Structure Granulation  Quality: Red, Pink Fascia Exposed: No Necrotic Amount: None Present (0%) Fat Layer (Subcutaneous Tissue) Exposed: Yes Tendon Exposed: No Muscle Exposed: No Joint Exposed: No Bone Exposed: No Treatment Notes Wound #1 (Lower Leg) Wound Laterality: Left, Medial Cleanser Soap and Water Discharge Instruction: May shower and wash wound with dial antibacterial soap and water prior to dressing change. Wound Cleanser Discharge Instruction: Cleanse the wound with wound cleanser prior to applying a clean dressing using gauze sponges, not tissue or cotton balls. Peri-Wound Care Triamcinolone 15 (g) Discharge Instruction: to periwouind mixed with lotion Sween Lotion (Moisturizing lotion) Discharge Instruction: Apply to periwound Topical Primary Dressing Hydrofera Blue Classic Foam, 2x2 in Discharge Instruction: Moisten with saline prior to applying to wound bed Secondary Dressing Woven Gauze Sponge, Non-Sterile 4x4 in Discharge Instruction: Apply over primary dressing as directed. Secured With Compression Wrap CoFlex TLC XL 2-layer Compression System 4x7 (in/yd) Discharge Instruction: Apply CoFlex 2-layer compression as directed. (alt for 4 layer) Compression Stockings Add-Ons Electronic Signature(s) Signed: 09/09/2021 4:49:13 PM By: Levan Hurst RN, BSN Entered By: Levan Hurst on 09/08/2021 14:40:01 -------------------------------------------------------------------------------- [place] Details Patient Name: Date of Service: Pam [name], Pam [name] 09/08/2021 2:00 PM Medical Record Number: [id number] Patient Account Number: [id number] Date of Birth/Sex: Treating RN: Apr 08, 1941 (80 y.o. [name] Primary Care Tiny Chaudhary: Donald Prose Other Clinician: Referring Leondra Cullin: Treating Teia Freitas/Extender: Charyl Dancer Weeks in Treatment: 16 Vital Signs Time Taken: 14:19 Temperature (F): 98.0 Height (in): 64 Pulse (bpm): 71 Weight (lbs): 180 Respiratory Rate  (breaths/min): 16 Body Mass Index (BMI): 30.9 Blood Pressure (mmHg): 135/81 Reference Range: 80 - 120 mg / dl Electronic Signature(s) Signed: 09/08/2021 4:47:29 PM By: Sandre Kitty Entered By: Sandre Kitty on 09/08/2021 14:19:41

## 2021-09-15 ENCOUNTER — Other Ambulatory Visit: Payer: Self-pay

## 2021-09-15 ENCOUNTER — Encounter (HOSPITAL_BASED_OUTPATIENT_CLINIC_OR_DEPARTMENT_OTHER): Payer: Medicare PPO | Attending: Internal Medicine | Admitting: Internal Medicine

## 2021-09-15 DIAGNOSIS — I87332 Chronic venous hypertension (idiopathic) with ulcer and inflammation of left lower extremity: Secondary | ICD-10-CM | POA: Diagnosis not present

## 2021-09-15 DIAGNOSIS — L97829 Non-pressure chronic ulcer of other part of left lower leg with unspecified severity: Secondary | ICD-10-CM | POA: Diagnosis present

## 2021-09-15 DIAGNOSIS — L97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 of skin: Secondary | ICD-10-CM | POA: Diagnosis not present

## 2021-09-16 NOTE — Progress Notes (Signed)
Pam, [name] ([id number]) Visit Report for 09/15/2021 HPI Details Patient Name: Date of Service: Pam [name], Pam [name] 09/15/2021 2:30 PM Medical Record Number: [id number] Patient Account Number: [id number] Date of Birth/Sex: Treating RN: [birth year] (80 y.o. Pam [name] Primary Care Provider: [name] Other Clinician: Referring Provider: Treating Provider/Extender: Angus Palms Weeks in Treatment: 17 History of Present Illness HPI Description: ADMISSION 05/14/2021 with This is a 80 year old reasonably healthy woman who is still working at [employer] and about to retire at the end of the month. She has had a problem with lower extremity edema for about the last 6 months she tells me. 2 months ago she noted small bumps on both of her legs however the left one medially morphed into a large blister. She was seen by her primary physician Dr. Wynelle [name] on 04/19/2021. Put on doxycycline. He noted at that time that the wound had been weeping for the last 5 weeks. The patient does not have a wound history. 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 We used Iodoflex under compression last week. 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 We have been using Iodoflex to clean up the wound surface. She came in with drainage. 6/24; wound on the left medial lower leg. Using Iodoflex to clean up the wound surface. We are making gradual progress with that. Nursing reported an odor and drainage. 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 Mentioning this because  I would like to see about Apligraf 6/29; left medial lower leg using Iodoflex to date. Making gradual improvement with a nonviable surface. 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 Close to ordering Apligraf through her insuranceo Next week 7/6; still have not heard about Apligraf through her insurance. She is making gradual improvement in the condition of this wound bed. She is retired and is now under [employer]. We have been using Hydrofera Blue under compression This is a fairly large wound. I have suspected that this was a venous insufficiency ulcer. As it is not really improving with standard therapy I have elected to go ahead and order venous reflux studies today 7/13; she is approved for Apligraf we will order that for next week. She continues to make decent improvements slightly smaller today surface looks better we have been using Hydrofera Blue under compression. She has her venous reflux studies next 7/20; Apligraf #1 07/14/2021 upon evaluation today patient appears to be doing excellent in regard to her wound. This is actually the first time I am seeing her. With that being said she does have good progress and improvement noted based on what I am seeing today and this is actually a progression #2 a for Apligraf. 8/17; improvement Apligraf #3. No concerns stated by the patient 8/31 Apligraf #4 very superficial now healthy granulation and smaller dimension 9/14; arrives with the wound having no depth and measurements of 1 x 0.8 cm. Given the very small nature and the healthy looking wound surface I did not feel it was worth putting Apligraf #5 on. Switched her to [facility]  Blue still under the same compression 9/21; surprisingly the patient arrives in clinic with the area actually is slightly bigger. Under illumination the surface of the wound looks healthy. I switched her to [facility] last week. Dimensions this week at 1.4 x 1.3 x 0.1 9/28; measuring  slightly smaller using Hydrofera Blue. Under illumination the wound bed looks healthy 10/5; quite a bit smaller this week. Using Hydrofera Blue. . Aggressive debridement last week Electronic Signature(s) Signed: 09/16/2021 5:45:05 PM By: Baltazar Najjar MD Entered By: Baltazar Najjar on 09/15/2021 15:33:06 -------------------------------------------------------------------------------- Physical Exam Details Patient Name: Date of Service: Pam [name], Pam [name] 09/15/2021 2:30 PM Medical Record Number: [id number] Patient Account Number: [id number] Date of Birth/Sex: Treating RN: [birth year] (80 y.o. Pam [name] Primary Care Provider: [name] Other Clinician: Referring Provider: Treating Provider/Extender: Angus Palms Weeks in Treatment: 17 Constitutional Sitting or standing Blood Pressure is within target range for patient.. Pulse regular and within target range for patient.Marland Kitchen Respirations regular, non-labored and within target range.. Temperature is normal and within the target range for the patient.Marland Kitchen Appears in no distress. Cardiovascular papable. We have excellent edema control. Notes Wound exam; left medial calf much smaller. Very tiny circular area. There is no evidence of infection under illumination the surface looks satisfactory no further debridement Electronic Signature(s) Signed: 09/16/2021 5:45:05 PM By: Baltazar Najjar MD Entered By: Baltazar Najjar on 09/15/2021 15:34:07 -------------------------------------------------------------------------------- Physician Orders Details Patient Name: Date of Service: Pam [name], Pam [name] 09/15/2021 2:30 PM Medical Record Number: [id number] Patient Account Number: [id number] Date of Birth/Sex: Treating RN: [birth year] (80 y.o. Pam [name] Primary Care Provider: [name] Other Clinician: Referring Provider: Treating Provider/Extender: Angus Palms Weeks in Treatment: 17 Verbal / Phone Orders:  No Diagnosis Coding ICD-10 Coding Code Description I87.332 Chronic venous hypertension (idiopathic) with ulcer and inflammation of left lower extremity L97.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 of skin Follow-up Appointments ppointment in 1 week. - with Dr. Leanord Hawking Return A Bathing/ Shower/ Hygiene May shower with protection but do not get wound dressing(s) wet. Edema Control - Lymphedema / SCD / Other Elevate legs to the level of the heart or above for 30 minutes daily and/or when sitting, a frequency of: - throughout the day Avoid standing for long periods of time. Exercise regularly Additional Orders / Instructions Follow Nutritious Diet Wound Treatment Wound #1 - Lower Leg Wound Laterality: Left, Medial Cleanser: Soap and Water 1 x Per Week/30 Days Discharge Instructions: May shower and wash wound with dial antibacterial soap and water prior to dressing change. Cleanser: Wound Cleanser 1 x Per Week/30 Days Discharge Instructions: Cleanse the wound with wound cleanser prior to applying a clean dressing using gauze sponges, not tissue or cotton balls. Peri-Wound Care: Triamcinolone 15 (g) 1 x Per Week/30 Days Discharge Instructions: to periwouind mixed with lotion Peri-Wound Care: Sween Lotion (Moisturizing lotion) 1 x Per Week/30 Days Discharge Instructions: Apply to periwound Prim Dressing: Hydrofera Blue Classic Foam, 2x2 in 1 x Per Week/30 Days ary Discharge Instructions: Moisten with saline prior to applying to wound bed Secondary Dressing: Woven Gauze Sponge, Non-Sterile 4x4 in 1 x Per Week/30 Days Discharge Instructions: Apply over primary dressing as directed. Compression Wrap: CoFlex TLC XL 2-layer Compression System 4x7 (in/yd) 1 x Per Week/30 Days Discharge Instructions: Apply CoFlex 2-layer compression as directed. (alt for 4 layer) Electronic Signature(s) Signed: 09/16/2021 5:45:05 PM By: Baltazar Najjar MD Signed:  09/16/2021 6:21:03 PM By: Zandra Abts RN, BSN  Entered By: Zandra Abts on 09/15/2021 15:30:25 -------------------------------------------------------------------------------- Problem List Details Patient Name: Date of Service: Pam [name], Pam [name] 09/15/2021 2:30 PM Medical Record Number: [id number] Patient Account Number: [id number] Date of Birth/Sex: Treating RN: 1941-06-22 (80 y.o. Pam [name] Primary Care Provider: [name] Other Clinician: Referring Provider: Treating Provider/Extender: Angus Palms Weeks in Treatment: 17 Active Problems ICD-10 Encounter Code Description Active Date MDM Diagnosis I87.332 Chronic venous hypertension (idiopathic) with ulcer and inflammation of left 05/14/2021 No Yes lower extremity L97.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 05/14/2021 No Yes of skin Inactive Problems Resolved Problems Electronic Signature(s) Signed: 09/16/2021 5:45:05 PM By: Baltazar Najjar MD Entered By: Baltazar Najjar on 09/15/2021 15:30:25 -------------------------------------------------------------------------------- Progress Note Details Patient Name: Date of Service: Pam [name], Pam [name] 09/15/2021 2:30 PM Medical Record Number: [id number] Patient Account Number: [id number] Date of Birth/Sex: Treating RN: [birth year] (80 y.o. Pam [name] Primary Care Provider: [name] Other Clinician: Referring Provider: Treating Provider/Extender: Angus Palms Weeks in Treatment: 17 Subjective History of Present Illness (HPI) ADMISSION 05/14/2021 with This is a 80 year old reasonably healthy woman who is still working at [employer] and about to retire at the end of the month. She has had a problem with lower extremity edema for about the last 6 months she tells me. 2 months ago she noted small bumps on both of her legs however the left one medially morphed into a large blister. She was seen by  her primary physician Dr. Wynelle [name] on 04/19/2021. Put on doxycycline. He noted at that time that the wound had been weeping for the last 5 weeks. The patient does not have a wound history. 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 We used Iodoflex under compression last week. 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 We have been using Iodoflex to clean up the wound surface. She came in with drainage. 6/24; wound on the left medial lower leg. Using Iodoflex to clean up the wound surface. We are making gradual progress with that. Nursing reported an odor and drainage. 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 Mentioning this because I would like to see about Apligraf 6/29; left medial lower leg using Iodoflex to date. Making gradual improvement with a nonviable surface. 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 Close to ordering Apligraf through her insuranceo Next week 7/6; still have not heard about Apligraf through her insurance. She is making gradual improvement in the condition of this wound bed. She is retired and is now under [employer]. We have been using Hydrofera Blue under compression This is a fairly large wound. I have suspected that this was a venous insufficiency ulcer. As it is not really improving with standard therapy I have elected to go ahead and order venous reflux studies today 7/13; she is approved for Apligraf we will order that for next week. She continues to make decent improvements slightly smaller today surface looks  better we have been using Hydrofera Blue under compression. She has her venous  reflux studies next 7/20; Apligraf #1 07/14/2021 upon evaluation today patient appears to be doing excellent in regard to her wound. This is actually the first time I am seeing her. With that being said she does have good progress and improvement noted based on what I am seeing today and this is actually a progression #2 a for Apligraf. 8/17; improvement Apligraf #3. No concerns stated by the patient 8/31 Apligraf #4 very superficial now healthy granulation and smaller dimension 9/14; arrives with the wound having no depth and measurements of 1 x 0.8 cm. Given the very small nature and the healthy looking wound surface I did not feel it was worth putting Apligraf #5 on. Switched her to [facility] still under the same compression 9/21; surprisingly the patient arrives in clinic with the area actually is slightly bigger. Under illumination the surface of the wound looks healthy. I switched her to [facility] last week. Dimensions this week at 1.4 x 1.3 x 0.1 9/28; measuring slightly smaller using Hydrofera Blue. Under illumination the wound bed looks healthy 10/5; quite a bit smaller this week. Using Hydrofera Blue. . Aggressive debridement last week Objective Constitutional Sitting or standing Blood Pressure is within target range for patient.. Pulse regular and within target range for patient.Marland Kitchen Respirations regular, non-labored and within target range.. Temperature is normal and within the target range for the patient.Marland Kitchen Appears in no distress. Vitals Time Taken: 2:56 PM, Height: 64 in, Weight: 180 lbs, BMI: 30.9, Temperature: 98.0 F, Pulse: 73 bpm, Respiratory Rate: 16 breaths/min, Blood Pressure: 146/89 mmHg. Cardiovascular papable. We have excellent edema control. General Notes: Wound exam; left medial calf much smaller. Very tiny circular area. There is no evidence of infection under illumination the surface  looks satisfactory no further debridement Integumentary (Hair, Skin) Wound #1 status is Open. Original cause of wound was Gradually Appeared. The date acquired was: 03/14/2021. The wound has been in treatment 17 weeks. The wound is located on the Left,Medial Lower Leg. The wound measures 0.4cm length x 0.5cm width x 0.1cm depth; 0.157cm^2 area and 0.016cm^3 volume. There is Fat Layer (Subcutaneous Tissue) exposed. There is no tunneling or undermining noted. There is a small amount of serosanguineous drainage noted. The wound margin is distinct with the outline attached to the wound base. There is large (67-100%) pink granulation within the wound bed. There is no necrotic tissue within the wound bed. 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 There was a Double Layer Compression Therapy Procedure by Zandra Abts, RN. Post procedure Diagnosis Wound #1: Same as Pre-Procedure Plan Follow-up Appointments: Return Appointment in 1 week. - with Dr. Chauncey Mann Shower/ Hygiene: May shower with protection but do not get wound dressing(s) wet. Edema Control - Lymphedema / SCD / Other: Elevate legs to the level of the heart or above for 30 minutes daily and/or when sitting, a frequency of: - throughout the day Avoid standing for long periods of time. Exercise regularly Additional Orders / Instructions: Follow Nutritious Diet WOUND #1: - Lower Leg Wound Laterality: Left, Medial Cleanser: Soap and Water 1 x Per Week/30 Days Discharge Instructions: May shower and wash wound with dial antibacterial soap and water prior to dressing change. Cleanser: Wound Cleanser 1 x Per Week/30 Days Discharge  Instructions:  Cleanse the wound with wound cleanser prior to applying a clean dressing using gauze sponges, not tissue or cotton balls. Peri-Wound Care: Triamcinolone 15 (g) 1 x Per Week/30 Days Discharge Instructions: to periwouind mixed with lotion Peri-Wound Care: Sween Lotion (Moisturizing lotion) 1 x Per Week/30 Days Discharge Instructions: Apply to periwound Prim Dressing: Hydrofera Blue Classic Foam, 2x2 in 1 x Per Week/30 Days ary Discharge Instructions: Moisten with saline prior to applying to wound bed Secondary Dressing: Woven Gauze Sponge, Non-Sterile 4x4 in 1 x Per Week/30 Days Discharge Instructions: Apply over primary dressing as directed. Com pression Wrap: CoFlex TLC XL 2-layer Compression System 4x7 (in/yd) 1 x Per Week/30 Days Discharge Instructions: Apply CoFlex 2-layer compression as directed. (alt for 4 layer) 1. No change in dressing Hydrofera Blue 2. Still under compression 3. Last week's debridement seem to have helped I did not repeat that today. I hope I do not regret that next week Electronic Signature(s) Signed: 09/16/2021 5:45:05 PM By: Baltazar Najjar MD Entered By: Baltazar Najjar on 09/15/2021 15:35:07 -------------------------------------------------------------------------------- SuperBill Details Patient Name: Date of Service: Pam [name], Pam [name] 09/15/2021 Medical Record Number: [id number] Patient Account Number: [id number] Date of Birth/Sex: Treating RN: 27-Jul-1941 (80 y.o. Pam [name] Primary Care Provider: [name] Other Clinician: Referring Provider: Treating Provider/Extender: Angus Palms Weeks in Treatment: 17 Diagnosis Coding ICD-10 Codes Code Description [phone] Chronic venous hypertension (idiopathic) with ulcer and inflammation of left lower extremity L97.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 of skin Facility Procedures Physician Procedures : CPT4 Code Description Modifier [phone]  [id number] - WC PHYS LEVEL 3 - EST PT ICD-10 Diagnosis Description I87.332 Chronic venous hypertension (idiopathic) with ulcer and inflammation of left lower extremity L97.821 Non-pressure chronic ulcer of other part  of left lower leg limited to breakdown of skin Quantity: 1 Electronic Signature(s) Signed: 09/16/2021 5:45:05 PM By: Baltazar Najjar MD Signed: 09/16/2021 6:21:03 PM By: Zandra Abts RN, BSN Entered By: Zandra Abts on 09/15/2021 17:04:06

## 2021-09-21 NOTE — Progress Notes (Signed)
Pam [name], Pam [name] ([id number]) Visit Report for 09/15/2021 Arrival Information Details Patient Name: Date of Service: Pam [name], Pam [name] 09/15/2021 2:30 PM Medical Record Number: [id number] Patient Account Number: [id number] Date of Birth/Sex: Treating RN: 1941-12-01 (80 y.o. [name] Primary Care Alveta Quintela: Donald Prose Other Clinician: Referring Fardeen Steinberger: Treating Raekwon Winkowski/Extender: Charyl Dancer Weeks in Treatment: 20 Visit Information History Since Last Visit Added or deleted any medications: No Patient Arrived: Ambulatory Any new allergies or adverse reactions: No Arrival Time: 14:54 Had a fall or experienced change in No Accompanied By: self activities of daily living that may affect Transfer Assistance: None risk of falls: Patient Identification Verified: Yes Signs or symptoms of abuse/neglect since last visito No Secondary Verification Process Completed: Yes Hospitalized since last visit: No Patient Requires Transmission-Based Precautions: No Implantable device outside of the clinic excluding No Patient Has Alerts: No cellular tissue based products placed in the center since last visit: Has Dressing in Place as Prescribed: Yes Pain Present Now: No Electronic Signature(s) Signed: 09/17/2021 8:14:56 AM By: Sandre Kitty Entered By: Sandre Kitty on 09/15/2021 14:56:27 -------------------------------------------------------------------------------- Compression Therapy Details Patient Name: Date of Service: Pam [name], Pam [name] 09/15/2021 2:30 PM Medical Record Number: [id number] Patient Account Number: [id number] Date of Birth/Sex: Treating RN: 01/06/1941 (80 y.o. [name] Primary Care Jean Alejos: Donald Prose Other Clinician: Referring Drae Mitzel: Treating Giovani Neumeister/Extender: Charyl Dancer Weeks in Treatment: 17 Compression Therapy Performed for Wound Assessment: Wound #1 Left,Medial Lower Leg Performed By: Clinician Levan Hurst,  RN Compression Type: Double Layer Post Procedure Diagnosis Same as Pre-procedure Electronic Signature(s) Signed: 09/16/2021 6:21:03 PM By: Levan Hurst RN, BSN Entered By: Levan Hurst on 09/15/2021 15:31:11 -------------------------------------------------------------------------------- Encounter Discharge Information Details Patient Name: Date of Service: Pam [name], Pam [name] 09/15/2021 2:30 PM Medical Record Number: [id number] Patient Account Number: [id number] Date of Birth/Sex: Treating RN: September 29, 1941 (80 y.o. [name] Primary Care Sanaiyah Kirchhoff: Donald Prose Other Clinician: Referring Yurianna Tusing: Treating Lucero Auzenne/Extender: Charyl Dancer Weeks in Treatment: 17 Encounter Discharge Information Items Discharge Condition: Stable Ambulatory Status: Ambulatory Discharge Destination: Home Transportation: Private Auto Accompanied By: alone Schedule Follow-up Appointment: Yes Clinical Summary of Care: Patient Declined Electronic Signature(s) Signed: 09/16/2021 6:21:03 PM By: Levan Hurst RN, BSN Entered By: Levan Hurst on 09/15/2021 17:04:34 -------------------------------------------------------------------------------- Lower Extremity Assessment Details Patient Name: Date of Service: Pam [name], Pam [name] 09/15/2021 2:30 PM Medical Record Number: [id number] Patient Account Number: [id number] Date of Birth/Sex: Treating RN: 1940/12/26 (80 y.o. [name], Lauren Primary Care Loyola Santino: Donald Prose Other Clinician: Referring Codylee Patil: Treating Yicel Shannon/Extender: Charyl Dancer Weeks in Treatment: 17 Edema Assessment Assessed: Shirlyn Goltz: Yes] Patrice Paradise: No] Edema: [Left: Ye] [Right: s] Calf Left: Right: Point of Measurement: 35 cm From Medial Instep 31 cm Ankle Left: Right: Point of Measurement: 9 cm From Medial Instep 22 cm Vascular Assessment Pulses: Dorsalis Pedis Palpable: [Left:Yes] Posterior Tibial Palpable: [Left:Yes] Electronic  Signature(s) Signed: 09/21/2021 5:02:53 PM By: Rhae Hammock RN Entered By: Rhae Hammock on 09/15/2021 15:08:58 -------------------------------------------------------------------------------- Multi Wound Chart Details Patient Name: Date of Service: Pam [name], Pam [name] 09/15/2021 2:30 PM Medical Record Number: [id number] Patient Account Number: [id number] Date of Birth/Sex: Treating RN: 03/16/1941 (80 y.o. [name] Primary Care Kham Zuckerman: Donald Prose Other Clinician: Referring Dacota Ruben: Treating Ihor Meinzer/Extender: Charyl Dancer Weeks in Treatment: 17 Vital Signs Height(in): 64 Pulse(bpm): 73 Weight(lbs): 180 Blood Pressure(mmHg): 146/89 Body Mass Index(BMI): 31 Temperature(F): 98.0 Respiratory Rate(breaths/min): 16 Photos: [N/A:N/A] Left, Medial Lower Leg N/A N/A Wound Location: Gradually Appeared N/A N/A Wounding Event: Venous Leg Ulcer  N/A N/A Primary Etiology: Cataracts, Glaucoma, Hypertension, N/A N/A Comorbid History: Osteoarthritis 03/14/2021 N/A N/A Date Acquired: 41 N/A N/A Weeks of Treatment: Open N/A N/A Wound Status: 0.4x0.5x0.1 N/A N/A Measurements L x W x D (cm) 0.157 N/A N/A A (cm) : rea 0.016 N/A N/A Volume (cm) : 99.70% N/A N/A % Reduction in Area: 99.70% N/A N/A % Reduction in Volume: Full Thickness With Exposed Support N/A N/A Classification: Structures Small N/A N/A Exudate Amount: Serosanguineous N/A N/A Exudate Type: red, brown N/A N/A Exudate Color: Distinct, outline attached N/A N/A Wound Margin: Large (67-100%) N/A N/A Granulation Amount: Pink N/A N/A Granulation Quality: None Present (0%) N/A N/A Necrotic Amount: Fat Layer (Subcutaneous Tissue): Yes N/A N/A Exposed Structures: Fascia: No Tendon: No Muscle: No Joint: No Bone: No Large (67-100%) N/A N/A Epithelialization: Treatment Notes Electronic Signature(s) Signed: 09/16/2021 5:45:05 PM By: Linton Ham MD Signed: 09/16/2021 6:21:03  PM By: Levan Hurst RN, BSN Entered By: Linton Ham on 09/15/2021 15:30:33 -------------------------------------------------------------------------------- Multi-Disciplinary Care Plan Details Patient Name: Date of Service: Pam [name], Pam [name] 09/15/2021 2:30 PM Medical Record Number: [id number] Patient Account Number: [id number] Date of Birth/Sex: Treating RN: 06-21-1941 (80 y.o. [name], Lauren Primary Care Makayla Confer: Other Clinician: Donald Prose Referring Ilyssa Grennan: Treating Luca Burston/Extender: Charyl Dancer Weeks in Treatment: 17 Active Inactive Venous Leg Ulcer Nursing Diagnoses: Actual venous Insuffiency (use after diagnosis is confirmed) Goals: Patient will maintain optimal edema control Date Initiated: 05/14/2021 Target Resolution Date: 09/29/2021 Goal Status: Active Patient/caregiver will verbalize understanding of disease process and disease management Date Initiated: 05/14/2021 Date Inactivated: 06/09/2021 Target Resolution Date: 06/11/2021 Goal Status: Met Interventions: Assess peripheral edema status every visit. Compression as ordered Treatment Activities: Therapeutic compression applied : 05/14/2021 Notes: 09/01/21: Edema control ongoing Wound/Skin Impairment Nursing Diagnoses: Impaired tissue integrity Goals: Patient/caregiver will verbalize understanding of skin care regimen Date Initiated: 05/14/2021 Target Resolution Date: 09/29/2021 Goal Status: Active Ulcer/skin breakdown will have a volume reduction of 30% by week 4 Date Initiated: 05/14/2021 Date Inactivated: 06/16/2021 Target Resolution Date: 06/18/2021 Goal Status: Met Interventions: Assess patient/caregiver ability to obtain necessary supplies Assess patient/caregiver ability to perform ulcer/skin care regimen upon admission and as needed Assess ulceration(s) every visit Provide education on ulcer and skin care Treatment Activities: Skin care regimen initiated : 05/14/2021 Topical wound  management initiated : 05/14/2021 Notes: Electronic Signature(s) Signed: 09/21/2021 5:02:53 PM By: Rhae Hammock RN Entered By: Rhae Hammock on 09/15/2021 15:09:17 -------------------------------------------------------------------------------- Pain Assessment Details Patient Name: Date of Service: Pam [name], Pam [name] 09/15/2021 2:30 PM Medical Record Number: [id number] Patient Account Number: [id number] Date of Birth/Sex: Treating RN: October 09, 1941 (80 y.o. [name] Primary Care Armanie Ullmer: Donald Prose Other Clinician: Referring Keddrick Wyne: Treating Purva Vessell/Extender: Charyl Dancer Weeks in Treatment: 17 Active Problems Location of Pain Severity and Description of Pain Patient Has Paino No Site Locations Pain Management and Medication Current Pain Management: Electronic Signature(s) Signed: 09/16/2021 6:21:03 PM By: Levan Hurst RN, BSN Signed: 09/17/2021 8:14:56 AM By: Sandre Kitty Entered By: Sandre Kitty on 09/15/2021 14:56:52 -------------------------------------------------------------------------------- Patient/Caregiver Education Details Patient Name: Date of Service: Pam [name], Pam [name] 10/5/2022andnbsp2:30 PM Medical Record Number: [id number] Patient Account Number: [id number] Date of Birth/Gender: Treating RN: October 29, 1941 (80 y.o. Benjaman Lobe Primary Care Physician: Donald Prose Other Clinician: Referring Physician: Treating Physician/Extender: Gwendalyn Ege in Treatment: 31 Education Assessment Education Provided To: Patient Education Topics Provided Wound/Skin Impairment: Methods: Explain/Verbal Responses: State content correctly [employer]) Signed: 09/21/2021 5:02:53 PM By: Rhae Hammock RN Entered By: Rhae Hammock on  09/15/2021 15:09:30 -------------------------------------------------------------------------------- Wound Assessment Details Patient Name: Date of Service: Pam [name], Pam [name] 09/15/2021 2:30 PM Medical Record Number: [id number] Patient Account Number: [id number] Date of Birth/Sex: Treating RN: November 02, 1941 (80 y.o. [name] Primary Care Tonica Brasington: Donald Prose Other Clinician: Referring Audie Wieser: Treating Tory Mckissack/Extender: Charyl Dancer Weeks in Treatment: 17 Wound Status Wound Number: 1 Primary Etiology: Venous Leg Ulcer Wound Location: Left, Medial Lower Leg Wound Status: Open Wounding Event: Gradually Appeared Comorbid History: Cataracts, Glaucoma, Hypertension, Osteoarthritis Date Acquired: 03/14/2021 Weeks Of Treatment: 17 Clustered Wound: No Photos Wound Measurements Length: (cm) 0.4 Width: (cm) 0.5 Depth: (cm) 0.1 Area: (cm) 0.157 Volume: (cm) 0.016 % Reduction in Area: 99.7% % Reduction in Volume: 99.7% Epithelialization: Large (67-100%) Tunneling: No Undermining: No Wound Description Classification: Full Thickness With Exposed Support Structures Wound Margin: Distinct, outline attached Exudate Amount: Small Exudate Type: Serosanguineous Exudate Color: red, brown Foul Odor After Cleansing: No Slough/Fibrino No Wound Bed Granulation Amount: Large (67-100%) Exposed Structure Granulation Quality: Pink Fascia Exposed: No Necrotic Amount: None Present (0%) Fat Layer (Subcutaneous Tissue) Exposed: Yes Tendon Exposed: No Muscle Exposed: No Joint Exposed: No Bone Exposed: No Treatment Notes Wound #1 (Lower Leg) Wound Laterality: Left, Medial Cleanser Soap and Water Discharge Instruction: May shower and wash wound with dial antibacterial soap and water prior to dressing change. Wound Cleanser Discharge Instruction: Cleanse the wound with wound cleanser prior to applying a clean dressing using gauze sponges, not tissue or cotton balls. Peri-Wound Care Triamcinolone 15 (g) Discharge Instruction: to periwouind mixed with lotion Sween Lotion (Moisturizing lotion) Discharge Instruction: Apply to  periwound Topical Primary Dressing Hydrofera Blue Classic Foam, 2x2 in Discharge Instruction: Moisten with saline prior to applying to wound bed Secondary Dressing Woven Gauze Sponge, Non-Sterile 4x4 in Discharge Instruction: Apply over primary dressing as directed. Secured With Compression Wrap CoFlex TLC XL 2-layer Compression System 4x7 (in/yd) Discharge Instruction: Apply CoFlex 2-layer compression as directed. (alt for 4 layer) Compression Stockings Add-Ons Electronic Signature(s) Signed: 09/16/2021 6:21:03 PM By: Levan Hurst RN, BSN Entered By: Levan Hurst on 09/15/2021 15:20:35 -------------------------------------------------------------------------------- Vitals Details Patient Name: Date of Service: Pam [name], Pam [name] 09/15/2021 2:30 PM Medical Record Number: [id number] Patient Account Number: [id number] Date of Birth/Sex: Treating RN: [birth year] (80 y.o. [name] Primary Care Markeita Alicia: Donald Prose Other Clinician: Referring Ariday Brinker: Treating Mlissa Tamayo/Extender: Charyl Dancer Weeks in Treatment: 17 Vital Signs Time Taken: 14:56 Temperature (F): 98.0 Height (in): 64 Pulse (bpm): 73 Weight (lbs): 180 Respiratory Rate (breaths/min): 16 Body Mass Index (BMI): 30.9 Blood Pressure (mmHg): 146/89 Reference Range: 80 - 120 mg / dl Electronic Signature(s) Signed: 09/17/2021 8:14:56 AM By: Sandre Kitty Entered By: Sandre Kitty on 09/15/2021 14:56:45

## 2021-09-22 ENCOUNTER — Encounter (HOSPITAL_BASED_OUTPATIENT_CLINIC_OR_DEPARTMENT_OTHER): Payer: Medicare PPO | Admitting: Internal Medicine

## 2021-09-22 ENCOUNTER — Other Ambulatory Visit: Payer: Self-pay

## 2021-09-22 DIAGNOSIS — I87332 Chronic venous hypertension (idiopathic) with ulcer and inflammation of left lower extremity: Secondary | ICD-10-CM | POA: Diagnosis not present

## 2021-09-22 NOTE — Progress Notes (Signed)
MCKINZEY, ENTWISTLE ([id number]) Visit Report for 09/22/2021 HPI Details Patient Name: Date of Service: KASHISH, YGLESIAS 09/22/2021 2:30 PM Medical Record Number: [id number] Patient Account Number: [id number] Date of Birth/Sex: Treating RN: 1941-02-20 (80 y.o. [name] Primary Care Provider: [name] Other Clinician: Referring Provider: Treating Provider/Extender: Angus Palms Weeks in Treatment: 18 History of Present Illness HPI Description: ADMISSION 05/14/2021 with This is a 80 year old reasonably healthy woman who is still working at [employer] and about to retire at the end of the month. She has had a problem with lower extremity edema for about the last 6 months she tells me. 2 months ago she noted small bumps on both of her legs however the left one medially morphed into a large blister. She was seen by her primary physician Dr. [name] on 04/19/2021. Put on doxycycline. He noted at that time that the wound had been weeping for the last 5 weeks. The patient does not have a wound history. 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 We used Iodoflex under compression last week. 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 We have been using Iodoflex to clean up the wound surface. She came in with drainage. 6/24; wound on the left medial lower leg. Using Iodoflex to clean up the wound surface. We are making gradual progress with that. Nursing reported an odor and drainage. 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 Mentioning this because  I would like to see about Apligraf 6/29; left medial lower leg using Iodoflex to date. Making gradual improvement with a nonviable surface. 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 Close to ordering Apligraf through her insuranceo Next week 7/6; still have not heard about Apligraf through her insurance. She is making gradual improvement in the condition of this wound bed. She is retired and is now under [employer]. We have been using Hydrofera Blue under compression This is a fairly large wound. I have suspected that this was a venous insufficiency ulcer. As it is not really improving with standard therapy I have elected to go ahead and order venous reflux studies today 7/13; she is approved for Apligraf we will order that for next week. She continues to make decent improvements slightly smaller today surface looks better we have been using Hydrofera Blue under compression. She has her venous reflux studies next 7/20; Apligraf #1 07/14/2021 upon evaluation today patient appears to be doing excellent in regard to her wound. This is actually the first time I am seeing her. With that being said she does have good progress and improvement noted based on what I am seeing today and this is actually a progression #2 a for Apligraf. 8/17; improvement Apligraf #3. No concerns stated by the patient 8/31 Apligraf #4 very superficial now healthy granulation and smaller dimension 9/14; arrives with the wound having no depth and measurements of 1 x 0.8 cm. Given the very small nature and the healthy looking wound surface I did not feel it was worth putting Apligraf #5 on. Switched her to [facility]  Blue still under the same compression 9/21; surprisingly the patient arrives in clinic with the area actually is slightly bigger. Under illumination the surface of the wound looks healthy. I switched her to [facility] last week. Dimensions this week at 1.4 x 1.3 x 0.1 9/28; measuring  slightly smaller using Hydrofera Blue. Under illumination the wound bed looks healthy 10/5; quite a bit smaller this week. Using Hydrofera Blue. . Aggressive debridement last week 10/12; the patient is healed left anterior medial lower leg. Unfortunately she does not have compression stockings for her chronic venous insufficiency. This is my oversight and I have to take responsibility for it. I have ordered venous reflux studies here at some point but I do not see that I actually commented on the results. It may have come back while I was on vacation I will see if I can research this next time Electronic Signature(s) Signed: 09/22/2021 4:37:06 PM By: Baltazar Najjar MD Entered By: Baltazar Najjar on 09/22/2021 16:33:47 -------------------------------------------------------------------------------- Physical Exam Details Patient Name: Date of Service: NEFERTITI, MOHAMAD 09/22/2021 2:30 PM Medical Record Number: [id number] Patient Account Number: [id number] Date of Birth/Sex: Treating RN: 02/22/1941 (80 y.o. [name] Primary Care Provider: [name] Other Clinician: Referring Provider: Treating Provider/Extender: Angus Palms Weeks in Treatment: 18 Constitutional Sitting or standing Blood Pressure is within target range for patient.. Pulse regular and within target range for patient.Marland Kitchen Respirations regular, non-labored and within target range.. Temperature is normal and within the target range for the patient.Marland Kitchen Appears in no distress. Notes Wound exam; left medial calf is closed. She has hemosiderin deposition in this area probably some degree of the amount of adherent fibrotic skin here. Lipodermatosclerosis Electronic Signature(s) Signed: 09/22/2021 4:37:06 PM By: Baltazar Najjar MD Entered By: Baltazar Najjar on 09/22/2021 16:34:36 -------------------------------------------------------------------------------- Physician Orders Details Patient Name: Date of  Service: HEDY, GARRO 09/22/2021 2:30 PM Medical Record Number: [id number] Patient Account Number: [id number] Date of Birth/Sex: Treating RN: 10-Apr-1941 (80 y.o. [name] Primary Care Provider: [name] Other Clinician: Referring Provider: Treating Provider/Extender: Angus Palms Weeks in Treatment: 7 Verbal / Phone Orders: No Diagnosis Coding ICD-10 Coding Code Description I87.332 Chronic venous hypertension (idiopathic) with ulcer and inflammation of left lower extremity L97.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 of skin Follow-up Appointments ppointment in 1 week. - with Dr. Leanord Hawking Return A Patient to purchase compression stockings and bring in next week to next appt. Bathing/ Shower/ Hygiene May shower with protection but do not get wound dressing(s) wet. Edema Control - Lymphedema / SCD / Other Elevate legs to the level of the heart or above for 30 minutes daily and/or when sitting, a frequency of: - throughout the day Avoid standing for long periods of time. Exercise regularly Compression stocking or Garment 20-30 mm/Hg pressure to: - Patient to purchase compression stockings 2-4 pair. bring in at next appt time. Additional Orders / Instructions Follow Nutritious Diet Wound Treatment Wound #1 - Lower Leg Wound Laterality: Left, Medial Cleanser: Soap and Water 1 x Per Week/30 Days Discharge Instructions: May shower and wash wound with dial antibacterial soap and water prior to dressing change. Cleanser: Wound Cleanser 1 x Per Week/30 Days Discharge Instructions: Cleanse the wound with wound cleanser prior to applying a clean dressing using gauze sponges, not tissue or cotton balls. Peri-Wound Care: Sween Lotion (Moisturizing lotion) 1 x Per Week/30 Days Discharge Instructions: Apply to periwound Compression Wrap: CoFlex TLC XL 2-layer  Compression System 4x7 (in/yd) 1 x Per Week/30 Days Discharge  Instructions: Apply CoFlex 2-layer compression as directed. (alt for 4 layer) Electronic Signature(s) Signed: 09/22/2021 4:37:06 PM By: Baltazar Najjar MD Signed: 09/22/2021 6:03:07 PM By: Shawn Stall RN, BSN Entered By: Shawn Stall on 09/22/2021 15:54:00 -------------------------------------------------------------------------------- Problem List Details Patient Name: Date of Service: TAYDE, SHAMMAS 09/22/2021 2:30 PM Medical Record Number: [id number] Patient Account Number: [id number] Date of Birth/Sex: Treating RN: 1941/05/26 (80 y.o. [name] Primary Care Provider: [name] Other Clinician: Referring Provider: Treating Provider/Extender: Angus Palms Weeks in Treatment: 18 Active Problems ICD-10 Encounter Code Description Active Date MDM Diagnosis I87.332 Chronic venous hypertension (idiopathic) with ulcer and inflammation of left 05/14/2021 No Yes lower extremity L97.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 05/14/2021 No Yes of skin Inactive Problems Resolved Problems Electronic Signature(s) Signed: 09/22/2021 4:37:06 PM By: Baltazar Najjar MD Entered By: Baltazar Najjar on 09/22/2021 16:31:59 -------------------------------------------------------------------------------- Progress Note Details Patient Name: Date of Service: LARAY, RUELL NELL 09/22/2021 2:30 PM Medical Record Number: [id number] Patient Account Number: [id number] Date of Birth/Sex: Treating RN: 10-Sep-1941 (80 y.o. [name] Primary Care Provider: [name] Other Clinician: Referring Provider: Treating Provider/Extender: Angus Palms Weeks in Treatment: 18 Subjective History of Present Illness (HPI) ADMISSION 05/14/2021 with This is a 80 year old reasonably healthy woman who is still working at [employer] and about to retire at the end of the month. She has had a problem with lower extremity edema for  about the last 6 months she tells me. 2 months ago she noted small bumps on both of her legs however the left one medially morphed into a large blister. She was seen by her primary physician Dr. [name] on 04/19/2021. Put on doxycycline. He noted at that time that the wound had been weeping for the last 5 weeks. The patient does not have a wound history. 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 We used Iodoflex under compression last week. 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 We have been using Iodoflex to clean up the wound surface. She came in with drainage. 6/24; wound on the left medial lower leg. Using Iodoflex to clean up the wound surface. We are making gradual progress with that. Nursing reported an odor and drainage. 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 Mentioning this because I would like to see about Apligraf 6/29; left medial lower leg using Iodoflex to date. Making gradual improvement with a nonviable surface. 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 Close to ordering Apligraf through her insuranceo Next week 7/6; still have not heard about Apligraf through her insurance. She is making gradual improvement in the condition of this wound bed. She is retired and is now under [employer]. We have been using Hydrofera Blue under compression This is a fairly large wound. I have suspected that this was a venous insufficiency ulcer. As it is not really improving with standard therapy I have elected to go ahead and order venous  reflux studies today 7/13; she is approved for Apligraf we will order that for next week. She continues to make decent improvements slightly smaller today surface looks better we have been using Hydrofera Blue under compression. She has her venous reflux studies next 7/20; Apligraf #1 07/14/2021 upon evaluation today patient appears to be doing excellent in regard to her wound. This is actually the first time I am seeing her. With that being said she does have good progress and improvement noted based on what I am seeing today and this is actually a progression #2 a for Apligraf. 8/17; improvement Apligraf #3. No concerns stated by the patient 8/31 Apligraf #4 very superficial now healthy granulation and smaller dimension 9/14; arrives with the wound having no depth and measurements of 1 x 0.8 cm. Given the very small nature and the healthy looking wound surface I did not feel it was worth putting Apligraf #5 on. Switched her to [facility] still under the same compression 9/21; surprisingly the patient arrives in clinic with the area actually is slightly bigger. Under illumination the surface of the wound looks healthy. I switched her to [facility] last week. Dimensions this week at 1.4 x 1.3 x 0.1 9/28; measuring slightly smaller using Hydrofera Blue. Under illumination the wound bed looks healthy 10/5; quite a bit smaller this week. Using Hydrofera Blue. . Aggressive debridement last week 10/12; the patient is healed left anterior medial lower leg. Unfortunately she does not have compression stockings for her chronic venous insufficiency. This is my oversight and I have to take responsibility for it. I have ordered venous reflux studies here at some point but I do not see that I actually commented on the results. It may have come back while I was on vacation I will see if I can research this next time Objective Constitutional Sitting or standing Blood Pressure is within target range for  patient.. Pulse regular and within target range for patient.Marland Kitchen Respirations regular, non-labored and within target range.. Temperature is normal and within the target range for the patient.Marland Kitchen Appears in no distress. Vitals Time Taken: 2:59 PM, Height: 64 in, Weight: 180 lbs, BMI: 30.9, Temperature: 98 F, Pulse: 67 bpm, Respiratory Rate: 20 breaths/min, Blood Pressure: 142/80 mmHg. General Notes: Wound exam; left medial calf is closed. She has hemosiderin deposition in this area probably some degree of the amount of adherent fibrotic skin here. Lipodermatosclerosis Integumentary (Hair, Skin) Wound #1 status is Open. Original cause of wound was Gradually Appeared. The date acquired was: 03/14/2021. The wound has been in treatment 18 weeks. The wound is located on the Left,Medial Lower Leg. The wound measures 0cm length x 0cm width x 0cm depth; 0cm^2 area and 0cm^3 volume. There is no tunneling or undermining noted. There is a none present amount of drainage noted. The wound margin is distinct with the outline attached to the wound base. There is no granulation within the wound bed. There is no necrotic tissue within the wound bed. 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 There was a Double Layer Compression Therapy Procedure by Shawn Stall, RN. Post procedure Diagnosis Wound #1: Same as Pre-Procedure Plan Follow-up Appointments: Return Appointment in 1 week. - with Dr. Leanord Hawking Patient to purchase compression stockings and  bring in next week to next appt. Bathing/ Shower/ Hygiene: May shower with protection but do not get wound dressing(s) wet. Edema Control - Lymphedema / SCD / Other: Elevate legs to the  level of the heart or above for 30 minutes daily and/or when sitting, a frequency of: - throughout the day Avoid standing for long periods of time. Exercise regularly Compression stocking or Garment 20-30 mm/Hg pressure to: - Patient to purchase compression stockings 2-4 pair. bring in at next appt time. Additional Orders / Instructions: Follow Nutritious Diet WOUND #1: - Lower Leg Wound Laterality: Left, Medial Cleanser: Soap and Water 1 x Per Week/30 Days Discharge Instructions: May shower and wash wound with dial antibacterial soap and water prior to dressing change. Cleanser: Wound Cleanser 1 x Per Week/30 Days Discharge Instructions: Cleanse the wound with wound cleanser prior to applying a clean dressing using gauze sponges, not tissue or cotton balls. Peri-Wound Care: Sween Lotion (Moisturizing lotion) 1 x Per Week/30 Days Discharge Instructions: Apply to periwound Com pression Wrap: CoFlex TLC XL 2-layer Compression System 4x7 (in/yd) 1 x Per Week/30 Days Discharge Instructions: Apply CoFlex 2-layer compression as directed. (alt for 4 layer) 1. The patient is closed but does not have stockings. The edema in her right leg is much larger than the left. We are going to have to set her up for 20/30 stockings. Therefore I put the left leg in compression for 1 more week hopefully discharge next week Electronic Signature(s) Signed: 09/22/2021 4:37:06 PM By: Baltazar Najjar MD Entered By: Baltazar Najjar on 09/22/2021 16:35:16 -------------------------------------------------------------------------------- SuperBill Details Patient Name: Date of Service: KYARA, BOXER 09/22/2021 Medical Record Number: [id number] Patient Account Number: [id number] Date of Birth/Sex: Treating RN: 15-Mar-1941 (80 y.o. [name] Primary Care Provider: [name] Other Clinician: Referring Provider: Treating Provider/Extender: Angus Palms Weeks in Treatment: 18 Diagnosis  Coding ICD-10 Codes Code Description [phone] Chronic venous hypertension (idiopathic) with ulcer and inflammation of left lower extremity L97.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 of skin Facility Procedures CPT4 Code: [id number] Description: (Facility Use Only) [id number] - APPLY MULTLAY COMPRS LWR LT LEG Modifier: Quantity: 1 Physician Procedures Electronic Signature(s) Signed: 09/22/2021 4:37:06 PM By: Baltazar Najjar MD Entered By: Baltazar Najjar on 09/22/2021 16:35:34

## 2021-09-23 NOTE — Progress Notes (Signed)
Pam [name], Pam [name] ([id number]) Visit Report for 09/22/2021 Arrival Information Details Patient Name: Date of Service: Pam [name], Pam [name] 09/22/2021 2:30 PM Medical Record Number: [id number] Patient Account Number: [id number] Date of Birth/Sex: Treating RN: April 11, 1941 (80 y.o. Pam [name], Pam [name] Primary Care Kameran Lallier: [name] Other Clinician: Referring Elfego Giammarino: Treating Dreshaun Stene/Extender: Angus Palms Weeks in Treatment: 18 Visit Information History Since Last Visit Added or deleted any medications: No Patient Arrived: Ambulatory Any new allergies or adverse reactions: No Arrival Time: 14:59 Had a fall or experienced change in No Accompanied By: self activities of daily living that may affect Transfer Assistance: None risk of falls: Patient Identification Verified: Yes Signs or symptoms of abuse/neglect since last visito No Secondary Verification Process Completed: Yes Hospitalized since last visit: No Patient Requires Transmission-Based Precautions: No Implantable device outside of the clinic excluding No Patient Has Alerts: No cellular tissue based products placed in the center since last visit: Has Dressing in Place as Prescribed: Yes Has Compression in Place as Prescribed: Yes Pain Present Now: No Electronic Signature(s) Signed: 09/22/2021 6:03:07 PM By: Shawn Stall RN, BSN Entered By: Shawn Stall on 09/22/2021 14:59:40 -------------------------------------------------------------------------------- Compression Therapy Details Patient Name: Date of Service: Pam [name], Pam [name] 09/22/2021 2:30 PM Medical Record Number: [id number] Patient Account Number: [id number] Date of Birth/Sex: Treating RN: 03-Oct-1941 (80 y.o. Pam [name] Primary Care Wah Sabic: [name] Other Clinician: Referring Ica Daye: Treating Rodolphe Edmonston/Extender: Angus Palms Weeks in Treatment: 18 Compression Therapy Performed for Wound Assessment: Wound #1 Left,Medial Lower  Leg Performed By: Clinician Shawn Stall, RN Compression Type: Double Layer Post Procedure Diagnosis Same as Pre-procedure Electronic Signature(s) Signed: 09/22/2021 6:03:07 PM By: Shawn Stall RN, BSN Entered By: Shawn Stall on 09/22/2021 15:54:15 -------------------------------------------------------------------------------- Encounter Discharge Information Details Patient Name: Date of Service: Pam [name], Pam [name] 09/22/2021 2:30 PM Medical Record Number: [id number] Patient Account Number: [id number] Date of Birth/Sex: Treating RN: 14-Jan-1941 (80 y.o. Pam [name] Primary Care Reyne Falconi: [name] Other Clinician: Referring Tykeem Lanzer: Treating Sherran Margolis/Extender: Angus Palms Weeks in Treatment: 18 Encounter Discharge Information Items Discharge Condition: Stable Ambulatory Status: Ambulatory Discharge Destination: Home Transportation: Private Auto Accompanied By: self Schedule Follow-up Appointment: Yes Clinical Summary of Care: Electronic Signature(s) Signed: 09/22/2021 6:03:07 PM By: Shawn Stall RN, BSN Entered By: Shawn Stall on 09/22/2021 15:54:59 -------------------------------------------------------------------------------- Lower Extremity Assessment Details Patient Name: Date of Service: Pam [name], Pam [name] 09/22/2021 2:30 PM Medical Record Number: [id number] Patient Account Number: [id number] Date of Birth/Sex: Treating RN: [birth year] (80 y.o. Pam [name], Pam [name] Primary Care Katasha Riga: [name] Other Clinician: Referring Nathaniel Wakeley: Treating Aydien Majette/Extender: Angus Palms Weeks in Treatment: 18 Edema Assessment Assessed: Pam [name]: Yes] Pam [name]: Yes] Edema: [Left: No] [Right: Yes] Calf Left: Right: Point of Measurement: 35 cm From Medial Instep 31 cm 35 cm Ankle Left: Right: Point of Measurement: 9 cm From Medial Instep 21 cm 25 cm Knee To Floor Left: Right: From Medial Instep 40 cm 40 cm Vascular  Assessment Pulses: Dorsalis Pedis Palpable: [Left:Yes] [Right:Yes] Electronic Signature(s) Signed: 09/22/2021 6:03:07 PM By: Shawn Stall RN, BSN Entered By: Shawn Stall on 09/22/2021 15:51:42 -------------------------------------------------------------------------------- Multi Wound Chart Details Patient Name: Date of Service: Pam [name], Pam [name] 09/22/2021 2:30 PM Medical Record Number: [id number] Patient Account Number: [id number] Date of Birth/Sex: Treating RN: 09/04/1941 (80 y.o. Pam [name] Primary Care Evon Dejarnett: [name] Other Clinician: Referring Cali Cuartas: Treating Keegan Ducey/Extender: Angus Palms Weeks in Treatment: 18 Vital Signs Height(in): 64 Pulse(bpm): 67 Weight(lbs): 180 Blood Pressure(mmHg): 142/80 Body Mass Index(BMI): 31 Temperature(F): 98 Respiratory  Rate(breaths/min): 20 Photos: [N/A:N/A] Left, Medial Lower Leg N/A N/A Wound Location: Gradually Appeared N/A N/A Wounding Event: Venous Leg Ulcer N/A N/A Primary Etiology: Cataracts, Glaucoma, Hypertension, N/A N/A Comorbid History: Osteoarthritis 03/14/2021 N/A N/A Date Acquired: 95 N/A N/A Weeks of Treatment: Open N/A N/A Wound Status: 0x0x0 N/A N/A Measurements L x W x D (cm) 0 N/A N/A A (cm) : rea 0 N/A N/A Volume (cm) : 100.00% N/A N/A % Reduction in Area: 100.00% N/A N/A % Reduction in Volume: Full Thickness With Exposed Support N/A N/A Classification: Structures None Present N/A N/A Exudate Amount: Distinct, outline attached N/A N/A Wound Margin: None Present (0%) N/A N/A Granulation Amount: None Present (0%) N/A N/A Necrotic Amount: Fascia: No N/A N/A Exposed Structures: Fat Layer (Subcutaneous Tissue): No Tendon: No Muscle: No Joint: No Bone: No Large (67-100%) N/A N/A Epithelialization: Compression Therapy N/A N/A Procedures Performed: Treatment Notes Wound #1 (Lower Leg) Wound Laterality: Left, Medial Cleanser Soap and Water Discharge  Instruction: May shower and wash wound with dial antibacterial soap and water prior to dressing change. Wound Cleanser Discharge Instruction: Cleanse the wound with wound cleanser prior to applying a clean dressing using gauze sponges, not tissue or cotton balls. Peri-Wound Care Sween Lotion (Moisturizing lotion) Discharge Instruction: Apply to periwound Topical Primary Dressing Secondary Dressing Secured With Compression Wrap CoFlex TLC XL 2-layer Compression System 4x7 (in/yd) Discharge Instruction: Apply CoFlex 2-layer compression as directed. (alt for 4 layer) Compression Stockings Add-Ons Electronic Signature(s) Signed: 09/22/2021 4:37:06 PM By: Baltazar Najjar MD Signed: 09/23/2021 4:58:13 PM By: Zandra Abts RN, BSN Entered By: Baltazar Najjar on 09/22/2021 16:32:04 -------------------------------------------------------------------------------- Multi-Disciplinary Care Plan Details Patient Name: Date of Service: ANAIRIS, KNICK [name] 09/22/2021 2:30 PM Medical Record Number: [id number] Patient Account Number: [id number] Date of Birth/Sex: Treating RN: Feb 09, 1941 (80 y.o. Pam [name] Primary Care Zierra Laroque: [name] Other Clinician: Referring Alaynna Kerwood: Treating Zuly Belkin/Extender: Angus Palms Weeks in Treatment: 18 Active Inactive Electronic Signature(s) Signed: 09/22/2021 6:03:07 PM By: Shawn Stall RN, BSN Entered By: Shawn Stall on 09/22/2021 15:01:34 -------------------------------------------------------------------------------- Pain Assessment Details Patient Name: Date of Service: Pam [name], Pam [name] 09/22/2021 2:30 PM Medical Record Number: [id number] Patient Account Number: [id number] Date of Birth/Sex: Treating RN: 11/04/1941 (80 y.o. Pam [name] Primary Care Tericka Devincenzi: [name] Other Clinician: Referring Georgia Delsignore: Treating Zoelle Markus/Extender: Angus Palms Weeks in Treatment: 18 Active Problems Location of Pain  Severity and Description of Pain Patient Has Paino No Site Locations Rate the pain. Rate the pain. Current Pain Level: 0 Pain Management and Medication Current Pain Management: Medication: No Cold Application: No Rest: No Massage: No Activity: No T.E.N.S.: No Heat Application: No Leg drop or elevation: No Is the Current Pain Management Adequate: Adequate How does your wound impact your activities of daily livingo Sleep: No Bathing: No Appetite: No Relationship With Others: No Bladder Continence: No Emotions: No Bowel Continence: No Work: No Toileting: No Drive: No Dressing: No Hobbies: No Psychologist, prison and probation services) Signed: 09/22/2021 6:03:07 PM By: Shawn Stall RN, BSN Entered By: Shawn Stall on 09/22/2021 15:00:00 -------------------------------------------------------------------------------- Patient/Caregiver Education Details Patient Name: Date of Service: Pam [name] 10/12/2022andnbsp2:30 PM Medical Record Number: [id number] Patient Account Number: [id number] Date of Birth/Gender: Treating RN: 12-26-1940 (80 y.o. Pam [name] Primary Care Physician: [name] Other Clinician: Referring Physician: Treating Physician/Extender: Burgess Estelle in Treatment: 18 Education Assessment Education Provided To: Patient Education Topics Provided Wound/Skin Impairment: Handouts: Skin Care Do's and Dont's Methods: Explain/Verbal Responses: Reinforcements needed Electronic Signature(s) Signed: 09/22/2021  6:03:07 PM By: Shawn Stall RN, BSN Entered By: Shawn Stall on 09/22/2021 15:01:50 -------------------------------------------------------------------------------- Wound Assessment Details Patient Name: Date of Service: Pam [name], Pam [name] 09/22/2021 2:30 PM Medical Record Number: [id number] Patient Account Number: [id number] Date of Birth/Sex: Treating RN: Feb 28, 1941 (80 y.o. Pam [name], Pam [name] Primary Care Hinton Luellen: [name] Other  Clinician: Referring Rosaline Ezekiel: Treating Anayah Arvanitis/Extender: Angus Palms Weeks in Treatment: 18 Wound Status Wound Number: 1 Primary Etiology: Venous Leg Ulcer Wound Location: Left, Medial Lower Leg Wound Status: Open Wounding Event: Gradually Appeared Comorbid History: Cataracts, Glaucoma, Hypertension, Osteoarthritis Date Acquired: 03/14/2021 Weeks Of Treatment: 18 Clustered Wound: No Photos Wound Measurements Length: (cm) Width: (cm) Depth: (cm) Area: (cm) Volume: (cm) 0 % Reduction in Area: 100% 0 % Reduction in Volume: 100% 0 Epithelialization: Large (67-100%) 0 Tunneling: No 0 Undermining: No Wound Description Classification: Full Thickness With Exposed Support Structures Wound Margin: Distinct, outline attached Exudate Amount: None Present Foul Odor After Cleansing: No Slough/Fibrino No Wound Bed Granulation Amount: None Present (0%) Exposed Structure Necrotic Amount: None Present (0%) Fascia Exposed: No Fat Layer (Subcutaneous Tissue) Exposed: No Tendon Exposed: No Muscle Exposed: No Joint Exposed: No Bone Exposed: No Treatment Notes Wound #1 (Lower Leg) Wound Laterality: Left, Medial Cleanser Soap and Water Discharge Instruction: May shower and wash wound with dial antibacterial soap and water prior to dressing change. Wound Cleanser Discharge Instruction: Cleanse the wound with wound cleanser prior to applying a clean dressing using gauze sponges, not tissue or cotton balls. Peri-Wound Care Sween Lotion (Moisturizing lotion) Discharge Instruction: Apply to periwound Topical Primary Dressing Secondary Dressing Secured With Compression Wrap CoFlex TLC XL 2-layer Compression System 4x7 (in/yd) Discharge Instruction: Apply CoFlex 2-layer compression as directed. (alt for 4 layer) Compression Stockings Add-Ons Electronic Signature(s) Signed: 09/22/2021 6:03:07 PM By: Shawn Stall RN, BSN Entered By: Shawn Stall on 09/22/2021  15:02:18 -------------------------------------------------------------------------------- Vitals Details Patient Name: Date of Service: Pam [name], Pam [name] 09/22/2021 2:30 PM Medical Record Number: [id number] Patient Account Number: [id number] Date of Birth/Sex: Treating RN: 1941/11/29 (80 y.o. Pam [name], Pam [name] Primary Care Sanaia Jasso: [name] Other Clinician: Referring Ellard Nan: Treating Pj Zehner/Extender: Angus Palms Weeks in Treatment: 18 Vital Signs Time Taken: 14:59 Temperature (F): 98 Height (in): 64 Pulse (bpm): 67 Weight (lbs): 180 Respiratory Rate (breaths/min): 20 Body Mass Index (BMI): 30.9 Blood Pressure (mmHg): 142/80 Reference Range: 80 - 120 mg / dl Electronic Signature(s) Signed: 09/22/2021 6:03:07 PM By: Shawn Stall RN, BSN Entered By: Shawn Stall on 09/22/2021 14:59:52

## 2021-09-30 ENCOUNTER — Other Ambulatory Visit: Payer: Self-pay

## 2021-09-30 ENCOUNTER — Encounter (HOSPITAL_BASED_OUTPATIENT_CLINIC_OR_DEPARTMENT_OTHER): Payer: Medicare PPO | Admitting: Internal Medicine

## 2021-09-30 DIAGNOSIS — I87332 Chronic venous hypertension (idiopathic) with ulcer and inflammation of left lower extremity: Secondary | ICD-10-CM | POA: Diagnosis not present

## 2021-09-30 NOTE — Progress Notes (Signed)
Pam [name] ([id number]) Visit Report for 09/30/2021 HPI Details Patient Name: Date of Service: Pam [name], Pam [name] 09/30/2021 2:45 PM Medical Record Number: [id number] Patient Account Number: [id number] Date of Birth/Sex: Treating RN: [birth year] (80 y.o. [name] Primary Care Provider: [name] Other Clinician: Referring Provider: Treating Provider/Extender: Angus Palms Weeks in Treatment: 19 History of Present Illness HPI Description: ADMISSION 05/14/2021 with This is a 81 year old reasonably healthy woman who is still working at [employer] and about to retire at the end of the month. She has had a problem with lower extremity edema for about the last 6 months she tells me. 2 months ago she noted small bumps on both of her legs however the left one medially morphed into a large blister. She was seen by her primary physician Dr. [name] on 04/19/2021. Put on doxycycline. He noted at that time that the wound had been weeping for the last 5 weeks. The patient does not have a wound history. 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 We used Iodoflex under compression last week. 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 We have been using Iodoflex to clean up the wound surface. She came in with drainage. 6/24; wound on the left medial lower leg. Using Iodoflex to clean up the wound surface. We are making gradual progress with that. Nursing reported an odor and drainage. 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 Mentioning this because  I would like to see about Apligraf 6/29; left medial lower leg using Iodoflex to date. Making gradual improvement with a nonviable surface. 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 Close to ordering Apligraf through her insuranceo Next week 7/6; still have not heard about Apligraf through her insurance. She is making gradual improvement in the condition of this wound bed. She is retired and is now under [employer]. We have been using Hydrofera Blue under compression This is a fairly large wound. I have suspected that this was a venous insufficiency ulcer. As it is not really improving with standard therapy I have elected to go ahead and order venous reflux studies today 7/13; she is approved for Apligraf we will order that for next week. She continues to make decent improvements slightly smaller today surface looks better we have been using Hydrofera Blue under compression. She has her venous reflux studies next 7/20; Apligraf #1 07/14/2021 upon evaluation today patient appears to be doing excellent in regard to her wound. This is actually the first time I am seeing her. With that being said she does have good progress and improvement noted based on what I am seeing today and this is actually a progression #2 a for Apligraf. 8/17; improvement Apligraf #3. No concerns stated by the patient 8/31 Apligraf #4 very superficial now healthy granulation and smaller dimension 9/14; arrives with the wound having no depth and measurements of 1 x 0.8 cm. Given the very small nature and the healthy looking wound surface I did not feel it was worth putting Apligraf #5 on. Switched her to [facility]  Blue still under the same compression 9/21; surprisingly the patient arrives in clinic with the area actually is slightly bigger. Under illumination the surface of the wound looks healthy. I switched her to [facility] last week. Dimensions this week at 1.4 x 1.3 x 0.1 9/28; measuring  slightly smaller using Hydrofera Blue. Under illumination the wound bed looks healthy 10/5; quite a bit smaller this week. Using Hydrofera Blue. . Aggressive debridement last week 10/12; the patient is healed left anterior medial lower leg. Unfortunately she does not have compression stockings for her chronic venous insufficiency. This is my oversight and I have to take responsibility for it. I have ordered venous reflux studies here at some point but I do not see that I actually commented on the results. It may have come back while I was on vacation I will see if I can research this next time 10/20; patient returns today with her stockings. She is healed on the extensive venous wound on the left medial lower leg. She will be discharged to her own 20/30 below-knee compression stockings Electronic Signature(s) Signed: 09/30/2021 5:12:42 PM By: Baltazar Najjar MD Entered By: Baltazar Najjar on 09/30/2021 16:01:38 -------------------------------------------------------------------------------- Physical Exam Details Patient Name: Date of Service: Pam [name] 09/30/2021 2:45 PM Medical Record Number: [id number] Patient Account Number: [id number] Date of Birth/Sex: Treating RN: Jul 07, 1941 (80 y.o. [name] Primary Care Provider: [name] Other Clinician: Referring Provider: Treating Provider/Extender: Angus Palms Weeks in Treatment: 19 Constitutional Sitting or standing Blood Pressure is within target range for patient.. Pulse regular and within target range for patient.Marland Kitchen Respirations regular, non-labored and within target range.. Temperature is normal and within the target range for the patient.Marland Kitchen Appears in no distress. Notes Wound exam; left medial calf is closed she has hemosiderin deposition but no open wound. 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 Tommye Standard Primary Care Provider: [name] Other Clinician: Referring Provider: Treating Provider/Extender: Angus Palms Weeks in Treatment: 71 Verbal / Phone Orders: No Diagnosis Coding ICD-10 Coding Code Description I87.332 Chronic venous hypertension (idiopathic) with ulcer and inflammation of left lower extremity L97.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 of skin Discharge From [facility] Services Discharge from Wound Care Center Bathing/ Shower/ Hygiene May shower and wash wound with soap and water. Edema Control - Lymphedema / SCD / Other Elevate legs to the level of the heart or above for 30 minutes daily and/or when sitting, a frequency of: - throughout the day Avoid standing for long periods of time. Exercise regularly Compression stocking or Garment 20-30 mm/Hg pressure to: - both legs daily Additional Orders / Instructions Follow Nutritious Diet Electronic Signature(s) Signed: 09/30/2021 5:12:42 PM By: Baltazar Najjar MD Signed: 09/30/2021 5:50:03 PM By: Zenaida Deed RN, BSN Entered By: Zenaida Deed on 09/30/2021 15:47:46 -------------------------------------------------------------------------------- Problem List Details Patient Name: Date of Service: MARVELENE, STONEBERG 09/30/2021 2:45 PM Medical Record Number: [id number] Patient Account Number: [id number] Date of Birth/Sex: Treating RN: 11-Mar-1941 (80 y.o. Tommye Standard Primary Care Provider: [name] Other Clinician: Referring Provider: Treating Provider/Extender: Angus Palms Weeks in Treatment: 19 Active  Problems ICD-10 Encounter Code Description Active Date MDM Diagnosis [phone]  Chronic venous hypertension (idiopathic) with ulcer and inflammation of left 05/14/2021 No Yes lower extremity L97.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 05/14/2021 No Yes of skin Inactive Problems Resolved Problems Electronic Signature(s) Signed: 09/30/2021 5:12:42 PM By: Baltazar Najjar MD Entered By: Baltazar Najjar on 09/30/2021 16:00:53 -------------------------------------------------------------------------------- Progress Note Details Patient Name: Date of Service: SHELLIA, HARTL 09/30/2021 2:45 PM Medical Record Number: [id number] Patient Account Number: [id number] Date of Birth/Sex: Treating RN: October 18, 1941 (80 y.o. [name] Primary Care Provider: [name] Other Clinician: Referring Provider: Treating Provider/Extender: Angus Palms Weeks in Treatment: 19 Subjective History of Present Illness (HPI) ADMISSION 05/14/2021 with This is a 80 year old reasonably healthy woman who is still working at [employer] and about to retire at the end of the month. She has had a problem with lower extremity edema for about the last 6 months she tells me. 2 months ago she noted small bumps on both of her legs however the left one medially morphed into a large blister. She was seen by her primary physician Dr. [name] on 04/19/2021. Put on doxycycline. He noted at that time that the wound had been weeping for the last 5 weeks. The patient does not have a wound history. 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 We used Iodoflex under compression last week. 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 We have been using Iodoflex to clean up the wound surface. She came in with drainage. 6/24; wound on the left medial lower leg. Using Iodoflex to clean up the wound surface. We are making gradual progress with that. Nursing reported an odor and drainage. 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 Mentioning this because I would like to see about Apligraf 6/29; left medial lower leg using Iodoflex to date. Making gradual improvement with a nonviable surface. 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 Close to ordering Apligraf through her insuranceo Next week 7/6; still have not heard about Apligraf through her insurance. She is making gradual improvement in the condition of this wound bed. She is retired and is now under [employer]. We have been using Hydrofera Blue under compression This is a fairly large wound. I have suspected that this was a venous insufficiency ulcer. As it is not really improving with standard therapy I have elected to go ahead and order venous reflux studies today 7/13; she is approved for Apligraf we will order that for next week. She continues to make decent improvements slightly smaller today surface looks better we have been using Hydrofera Blue under compression. She has her venous reflux studies next 7/20; Apligraf #1 07/14/2021 upon evaluation today patient appears to be doing excellent in regard to her wound. This is actually the first time I am seeing her. With that being said she does have good progress and improvement noted based on what I am seeing today and this is actually a progression #2 a for Apligraf. 8/17; improvement Apligraf #3. No concerns stated by the patient 8/31 Apligraf #  4 very superficial now healthy granulation and smaller dimension 9/14;  arrives with the wound having no depth and measurements of 1 x 0.8 cm. Given the very small nature and the healthy looking wound surface I did not feel it was worth putting Apligraf #5 on. Switched her to [facility] still under the same compression 9/21; surprisingly the patient arrives in clinic with the area actually is slightly bigger. Under illumination the surface of the wound looks healthy. I switched her to [facility] last week. Dimensions this week at 1.4 x 1.3 x 0.1 9/28; measuring slightly smaller using Hydrofera Blue. Under illumination the wound bed looks healthy 10/5; quite a bit smaller this week. Using Hydrofera Blue. . Aggressive debridement last week 10/12; the patient is healed left anterior medial lower leg. Unfortunately she does not have compression stockings for her chronic venous insufficiency. This is my oversight and I have to take responsibility for it. I have ordered venous reflux studies here at some point but I do not see that I actually commented on the results. It may have come back while I was on vacation I will see if I can research this next time 10/20; patient returns today with her stockings. She is healed on the extensive venous wound on the left medial lower leg. She will be discharged to her own 20/30 below-knee compression stockings Objective Constitutional Sitting or standing Blood Pressure is within target range for patient.. Pulse regular and within target range for patient.Marland Kitchen Respirations regular, non-labored and within target range.. Temperature is normal and within the target range for the patient.Marland Kitchen Appears in no distress. Vitals Time Taken: 3:26 PM, Height: 64 in, Source: Stated, Weight: 180 lbs, Source: Stated, BMI: 30.9, Temperature: 98.4 F, Pulse: 61 bpm, Respiratory Rate: 18 breaths/min, Blood Pressure: 149/73 mmHg. General Notes: Wound exam; left medial calf is closed she has hemosiderin deposition but no open wound. Lipodermatosclerosis  but everything is healed Integumentary (Hair, Skin) Wound #1 status is Healed - Epithelialized. Original cause of wound was Gradually Appeared. The date acquired was: 03/14/2021. The wound has been in treatment 19 weeks. The wound is located on the Left,Medial Lower Leg. The wound measures 0cm length x 0cm width x 0cm depth; 0cm^2 area and 0cm^3 volume. There is no tunneling or undermining noted. There is a none present amount of drainage noted. There is no granulation within the wound bed. There is no necrotic tissue within the wound bed. Assessment Active Problems ICD-10 Chronic venous hypertension (idiopathic) with ulcer and inflammation of left lower extremity Non-pressure chronic ulcer of other part of left lower leg limited to breakdown of skin Plan Discharge From [facility] Services: Discharge from Wound Care Center Bathing/ Shower/ Hygiene: May shower and wash wound with soap and water. Edema Control - Lymphedema / SCD / Other: Elevate legs to the level of the heart or above for 30 minutes daily and/or when sitting, a frequency of: - throughout the day Avoid standing for long periods of time. Exercise regularly Compression stocking or Garment 20-30 mm/Hg pressure to: - both legs daily Additional Orders / Instructions: Follow Nutritious Diet 1. The patient can be discharged to her own 20/30 below-knee stockings to the legs daily 2. Skin moisturizer at night. 3. Call us as needed Electronic Signature(s) Signed: 09/30/2021 5:12:42 PM By: Baltazar Najjar MD Entered By: Baltazar Najjar on 09/30/2021 16:02:55 -------------------------------------------------------------------------------- SuperBill Details Patient Name: Date of Service: KATELY, GRAFFAM 09/30/2021 Medical Record Number: [id number] Patient Account Number: [id number] Date of Birth/Sex: Treating RN:  [birth year] (80 y.o. Tommye Standard Primary Care Provider: [name] Other Clinician: Referring Provider: Treating  Provider/Extender: Angus Palms Weeks in Treatment: 19 Diagnosis Coding ICD-10 Codes Code Description [phone] Chronic venous hypertension (idiopathic) with ulcer and inflammation of left lower extremity L97.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 of skin Facility Procedures CPT4 Code: [id number] Description: 99213 - WOUND CARE VISIT-LEV 3 EST PT Modifier: Quantity: 1 Physician Procedures : CPT4 Code Description Modifier [id number] [phone] - WC PHYS LEVEL 2 - EST PT ICD-10 Diagnosis Description I87.332 Chronic venous hypertension (idiopathic) with ulcer and inflammation of left lower extremity L97.821 Non-pressure chronic ulcer of other part  of left lower leg limited to breakdown of skin Quantity: 1 Electronic Signature(s) Signed: 09/30/2021 5:12:42 PM By: Baltazar Najjar MD Entered By: Baltazar Najjar on 09/30/2021 16:03:09

## 2021-09-30 NOTE — Progress Notes (Signed)
SATORIA, DUNLOP ([id number]) Visit Report for 09/30/2021 Arrival Information Details Patient Name: Date of Service: Pam, [name] 09/30/2021 2:45 PM Medical Record Number: [id number] Patient Account Number: [id number] Date of Birth/Sex: Treating RN: Jul 17, 1941 (80 y.o. Pam [name], Pam [name] Primary Care Asja Frommer: [name] Other Clinician: Referring Bernadine Melecio: Treating Dakia Schifano/Extender: Angus Palms Weeks in Treatment: 19 Visit Information History Since Last Visit Added or deleted any medications: No Patient Arrived: Ambulatory Any new allergies or adverse reactions: No Arrival Time: 15:24 Had a fall or experienced change in No Accompanied By: self activities of daily living that may affect Transfer Assistance: None risk of falls: Patient Identification Verified: Yes Signs or symptoms of abuse/neglect since last visito No Secondary Verification Process Completed: Yes Hospitalized since last visit: No Patient Requires Transmission-Based Precautions: No Implantable device outside of the clinic excluding No Patient Has Alerts: No cellular tissue based products placed in the center since last visit: Has Dressing in Place as Prescribed: Yes Has Compression in Place as Prescribed: Yes Pain Present Now: No Electronic Signature(s) Signed: 09/30/2021 5:50:03 PM By: Zenaida Deed RN, BSN Entered By: Zenaida Deed on 09/30/2021 15:26:12 -------------------------------------------------------------------------------- Clinic Level of Care Assessment Details Patient Name: Date of Service: Pam, [name] 09/30/2021 2:45 PM Medical Record Number: [id number] Patient Account Number: [id number] Date of Birth/Sex: Treating RN: 1941/06/22 (80 y.o. Pam [name] Primary Care Vollie Brunty: [name] Other Clinician: Referring Keerstin Bjelland: Treating Montie Gelardi/Extender: Angus Palms Weeks in Treatment: 19 Clinic Level of Care Assessment Items TOOL 4 Quantity  Score []  - 0 Use when only an EandM is performed on FOLLOW-UP visit ASSESSMENTS - Nursing Assessment / Reassessment X- 1 10 Reassessment of Co-morbidities (includes updates in patient status) X- 1 5 Reassessment of Adherence to Treatment Plan ASSESSMENTS - Wound and Skin A ssessment / Reassessment X - Simple Wound Assessment / Reassessment - one wound 1 5 []  - 0 Complex Wound Assessment / Reassessment - multiple wounds []  - 0 Dermatologic / Skin Assessment (not related to wound area) ASSESSMENTS - Focused Assessment X- 1 5 Circumferential Edema Measurements - multi extremities []  - 0 Nutritional Assessment / Counseling / Intervention X- 1 5 Lower Extremity Assessment (monofilament, tuning fork, pulses) []  - 0 Peripheral Arterial Disease Assessment (using hand held doppler) ASSESSMENTS - Ostomy and/or Continence Assessment and Care []  - 0 Incontinence Assessment and Management []  - 0 Ostomy Care Assessment and Management (repouching, etc.) PROCESS - Coordination of Care X - Simple Patient / Family Education for ongoing care 1 15 []  - 0 Complex (extensive) Patient / Family Education for ongoing care X- 1 10 Staff obtains , Records, T Results / Process Orders est []  - 0 Staff telephones HHA, Nursing Homes / Clarify orders / etc []  - 0 Routine Transfer to another Facility (non-emergent condition) []  - 0 Routine Hospital Admission (non-emergent condition) []  - 0 New Admissions / / Ordering NPWT Apligraf, etc. , []  - 0 Emergency Hospital Admission (emergent condition) X- 1 10 Simple Discharge Coordination []  - 0 Complex (extensive) Discharge Coordination PROCESS - Special Needs []  - 0 Pediatric / Minor Patient Management []  - 0 Isolation Patient Management []  - 0 Hearing / Language / Visual special needs []  - 0 Assessment of Community assistance (transportation, D/C planning, etc.) []  - 0 Additional assistance / Altered  mentation []  - 0 Support Surface(s) Assessment (bed, cushion, seat, etc.) INTERVENTIONS - Wound Cleansing / Measurement X - Simple Wound Cleansing - one wound 1 5 []  - 0 Complex Wound  Cleansing - multiple wounds X- 1 5 Wound Imaging (photographs - any number of wounds) []  - 0 Wound Tracing (instead of photographs) []  - 0 Simple Wound Measurement - one wound []  - 0 Complex Wound Measurement - multiple wounds INTERVENTIONS - Wound Dressings []  - 0 Small Wound Dressing one or multiple wounds []  - 0 Medium Wound Dressing one or multiple wounds []  - 0 Large Wound Dressing one or multiple wounds []  - 0 Application of Medications - topical []  - 0 Application of Medications - injection INTERVENTIONS - Miscellaneous []  - 0 External ear exam []  - 0 Specimen Collection (cultures, biopsies, blood, body fluids, etc.) []  - 0 Specimen(s) / Culture(s) sent or taken to Lab for analysis []  - 0 Patient Transfer (multiple staff / / Similar devices) []  - 0 Simple Staple / Suture removal (25 or less) []  - 0 Complex Staple / Suture removal (26 or more) []  - 0 Hypo / Hyperglycemic Management (close monitor of Blood Glucose) []  - 0 Ankle / Brachial Index (ABI) - do not check if billed separately X- 1 5 Vital Signs Has the patient been seen at the hospital within the last three years: Yes Total Score: 80 Level Of Care: New/Established - Level 3 Electronic Signature(s) Signed: 09/30/2021 5:50:03 PM By: RN, BSN Entered By: on 09/30/2021 15:48:23 -------------------------------------------------------------------------------- Encounter Discharge Information Details Patient Name: Date of Service: Pam, JECH Pam [name] 09/30/2021 2:45 PM Medical Record Number: Patient Account Number: Date of Birth/Sex: Treating RN: [birth year] (80 y.o. Primary Care Rasheka Denard: Nurse, adult Other Clinician: Referring  Kiel Cockerell: Treating Gerrica Cygan/Extender: Weeks in Treatment: 19 Encounter Discharge Information Items Discharge Condition: Stable Ambulatory Status: Ambulatory Discharge Destination: Home Transportation: Private Auto Accompanied By: self Schedule Follow-up Appointment: Yes Clinical Summary of Care: Patient Declined Electronic Signature(s) Signed: 09/30/2021 5:50:03 PM By: RN, BSN Entered By: 10/02/2021 on 09/30/2021 15:55:51 -------------------------------------------------------------------------------- Lower Extremity Assessment Details Patient Name: Date of Service: Pam, [name] 09/30/2021 2:45 PM Medical Record Number: [address] Patient Account Number: 10/02/2021 Date of Birth/Sex: Treating RN: 1941/12/03 (80 y.o. 05/27/1941 Primary Care Deaunte Dente: 72 Other Clinician: Referring Yoltzin Ransom: Treating Melyssa Signor/Extender: Pam [name] Weeks in Treatment: 19 Edema Assessment Assessed: Angus Palms: No] [Right: No] Edema: [Left: N] [Right: o] Calf Left: Right: Point of Measurement: 35 cm From Medial Instep 31.4 cm Ankle Left: Right: Point of Measurement: 9 cm From Medial Instep 22.3 cm Vascular Assessment Pulses: Dorsalis Pedis Palpable: [Left:Yes] Electronic Signature(s) Signed: 09/30/2021 5:50:03 PM By: 10/02/2021 RN, BSN Entered By: Zenaida Deed on 09/30/2021 15:32:08 -------------------------------------------------------------------------------- Multi Wound Chart Details Patient Name: Date of Service: Pam, GOSSER Pam [name] 09/30/2021 2:45 PM Medical Record Number: 10/02/2021 Patient Account Number: [id number] Date of Birth/Sex: Treating RN: [birth year] (80 y.o. 70 Primary Care Paula Zietz: Pam [name] Other Clinician: Referring Loni Abdon: Treating Axel Meas/Extender: [name] Weeks in Treatment: 19 Vital Signs Height(in): 64 Pulse(bpm): 61 Weight(lbs):  180 Blood Pressure(mmHg): 149/73 Body Mass Index(BMI): 31 Temperature(F): 98.4 Respiratory Rate(breaths/min): 18 Photos: [N/A:N/A] Left, Medial Lower Leg N/A N/A Wound Location: Gradually Appeared N/A N/A Wounding Event: Venous Leg Ulcer N/A N/A Primary Etiology: Cataracts, Glaucoma, Hypertension, N/A N/A Comorbid History: Osteoarthritis 03/14/2021 N/A N/A Date Acquired: 20 N/A N/A Weeks of Treatment: Healed - Epithelialized N/A N/A Wound Status: 0x0x0 N/A N/A Measurements L x W x D (cm) 0 N/A N/A A (cm) : rea 0 N/A N/A Volume (cm) : 100.00%  N/A N/A % Reduction in Area: 100.00% N/A N/A % Reduction in Volume: Full Thickness With Exposed Support N/A N/A Classification: Structures None Present N/A N/A Exudate Amount: None Present (0%) N/A N/A Granulation Amount: None Present (0%) N/A N/A Necrotic Amount: Fascia: No N/A N/A Exposed Structures: Fat Layer (Subcutaneous Tissue): No Tendon: No Muscle: No Joint: No Bone: No Large (67-100%) N/A N/A Epithelialization: Treatment Notes Electronic Signature(s) Signed: 09/30/2021 5:12:42 PM By: Baltazar Najjar MD Signed: 09/30/2021 5:40:08 PM By: Shawn Stall RN, BSN Signed: 09/30/2021 5:40:08 PM By: Shawn Stall RN, BSN Entered By: Baltazar Najjar on 09/30/2021 16:00:59 -------------------------------------------------------------------------------- Pain Assessment Details Patient Name: Date of Service: Pam, [name] 09/30/2021 2:45 PM Medical Record Number: [id number] Patient Account Number: [id number] Date of Birth/Sex: Treating RN: 03/12/1941 (80 y.o. Pam [name] Primary Care Haitham Dolinsky: [name] Other Clinician: Referring Juancarlos Crescenzo: Treating Drisana Schweickert/Extender: Angus Palms Weeks in Treatment: 19 Active Problems Location of Pain Severity and Description of Pain Patient Has Paino No Site Locations Rate the pain. Current Pain Level: 0 Pain Management and Medication Current  Pain Management: Electronic Signature(s) Signed: 09/30/2021 5:50:03 PM By: Zenaida Deed RN, BSN Entered By: Zenaida Deed on 09/30/2021 15:27:40 -------------------------------------------------------------------------------- Patient/Caregiver Education Details Patient Name: Date of Service: Pam, [name] 10/20/2022andnbsp2:45 PM Medical Record Number: [id number] Patient Account Number: [id number] Date of Birth/Gender: Treating RN: 1941/04/05 (80 y.o. Pam [name] Primary Care Physician: [name] Other Clinician: Referring Physician: Treating Physician/Extender: Burgess Estelle in Treatment: 19 Education Assessment Education Provided To: Patient Education Topics Provided Venous: Methods: Explain/Verbal Responses: Reinforcements needed, State content correctly Wound/Skin Impairment: Methods: Explain/Verbal Responses: Reinforcements needed, State content correctly Electronic Signature(s) Signed: 09/30/2021 5:50:03 PM By: Zenaida Deed RN, BSN Entered By: Zenaida Deed on 09/30/2021 15:34:48 -------------------------------------------------------------------------------- Wound Assessment Details Patient Name: Date of Service: Pam, [name] 09/30/2021 2:45 PM Medical Record Number: [id number] Patient Account Number: [id number] Date of Birth/Sex: Treating RN: Jan 27, 1941 (80 y.o. Pam [name] Primary Care Calieb Lichtman: [name] Other Clinician: Referring Ulla Mckiernan: Treating Ria Redcay/Extender: Angus Palms Weeks in Treatment: 19 Wound Status Wound Number: 1 Primary Etiology: Venous Leg Ulcer Wound Location: Left, Medial Lower Leg Wound Status: Healed - Epithelialized Wounding Event: Gradually Appeared Comorbid History: Cataracts, Glaucoma, Hypertension, Osteoarthritis Date Acquired: 03/14/2021 Weeks Of Treatment: 19 Clustered Wound: No Photos Wound Measurements Length: (cm) Width: (cm) Depth: (cm) Area:  (cm) Volume: (cm) 0 % Reduction in Area: 100% 0 % Reduction in Volume: 100% 0 Epithelialization: Large (67-100%) 0 Tunneling: No 0 Undermining: No Wound Description Classification: Full Thickness With Exposed Support Structures Exudate Amount: None Present Foul Odor After Cleansing: No Slough/Fibrino No Wound Bed Granulation Amount: None Present (0%) Exposed Structure Necrotic Amount: None Present (0%) Fascia Exposed: No Fat Layer (Subcutaneous Tissue) Exposed: No Tendon Exposed: No Muscle Exposed: No Joint Exposed: No Bone Exposed: No Electronic Signature(s) Signed: 09/30/2021 5:40:08 PM By: Shawn Stall RN, BSN Signed: 09/30/2021 5:50:03 PM By: Zenaida Deed RN, BSN Entered By: Shawn Stall on 09/30/2021 15:33:35 -------------------------------------------------------------------------------- Vitals Details Patient Name: Date of Service: Pam, HAVLIK Pam [name] 09/30/2021 2:45 PM Medical Record Number: [id number] Patient Account Number: [id number] Date of Birth/Sex: Treating RN: Jul 21, 1941 (80 y.o. Pam [name] Primary Care Patrizia Paule: [name] Other Clinician: Referring Rawn Quiroa: Treating Jaicob Dia/Extender: Angus Palms Weeks in Treatment: 19 Vital Signs Time Taken: 15:26 Temperature (F): 98.4 Height (in): 64 Pulse (bpm): 61 Source: Stated Respiratory Rate (breaths/min): 18 Weight (lbs): 180 Blood Pressure (mmHg): 149/73 Source: Stated Reference Range: 80 -  120 mg / dl Body Mass Index (BMI): 30.9 Electronic Signature(s) Signed: 09/30/2021 5:50:03 PM By: Zenaida Deed RN, BSN Entered By: Zenaida Deed on 09/30/2021 15:27:03

## 2022-06-06 DIAGNOSIS — M85832 Other specified disorders of bone density and structure, left forearm: Secondary | ICD-10-CM | POA: Diagnosis not present

## 2022-06-06 DIAGNOSIS — Z0000 Encounter for general adult medical examination without abnormal findings: Secondary | ICD-10-CM | POA: Diagnosis not present

## 2022-06-06 DIAGNOSIS — H402290 Chronic angle-closure glaucoma, unspecified eye, stage unspecified: Secondary | ICD-10-CM | POA: Diagnosis not present

## 2022-06-06 DIAGNOSIS — Z1159 Encounter for screening for other viral diseases: Secondary | ICD-10-CM | POA: Diagnosis not present

## 2022-06-06 DIAGNOSIS — R600 Localized edema: Secondary | ICD-10-CM | POA: Diagnosis not present

## 2022-06-06 DIAGNOSIS — I10 Essential (primary) hypertension: Secondary | ICD-10-CM | POA: Diagnosis not present

## 2022-06-06 DIAGNOSIS — Z1211 Encounter for screening for malignant neoplasm of colon: Secondary | ICD-10-CM | POA: Diagnosis not present

## 2022-06-06 DIAGNOSIS — E785 Hyperlipidemia, unspecified: Secondary | ICD-10-CM | POA: Diagnosis not present

## 2022-06-06 DIAGNOSIS — M170 Bilateral primary osteoarthritis of knee: Secondary | ICD-10-CM | POA: Diagnosis not present

## 2022-06-17 ENCOUNTER — Ambulatory Visit: Payer: Medicare PPO | Admitting: Orthopaedic Surgery

## 2022-06-17 ENCOUNTER — Ambulatory Visit (INDEPENDENT_AMBULATORY_CARE_PROVIDER_SITE_OTHER): Payer: Medicare PPO

## 2022-06-17 ENCOUNTER — Encounter: Payer: Self-pay | Admitting: Orthopaedic Surgery

## 2022-06-17 ENCOUNTER — Ambulatory Visit: Payer: Self-pay

## 2022-06-17 VITALS — Ht 61.0 in | Wt 160.0 lb

## 2022-06-17 DIAGNOSIS — M1711 Unilateral primary osteoarthritis, right knee: Secondary | ICD-10-CM

## 2022-06-17 DIAGNOSIS — M21162 Varus deformity, not elsewhere classified, left knee: Secondary | ICD-10-CM | POA: Diagnosis not present

## 2022-06-17 DIAGNOSIS — M1712 Unilateral primary osteoarthritis, left knee: Secondary | ICD-10-CM | POA: Diagnosis not present

## 2022-06-17 DIAGNOSIS — M170 Bilateral primary osteoarthritis of knee: Secondary | ICD-10-CM | POA: Diagnosis not present

## 2022-06-17 MED ORDER — MELOXICAM 7.5 MG PO TABS: 7.5000 mg | ORAL_TABLET | Freq: Two times a day (BID) | ORAL | 2 refills | Status: AC | PRN

## 2022-06-17 NOTE — Progress Notes (Signed)
Office Visit Note   Patient: Pam [name]           Date of Birth: 03-25-1941           MRN: [id number] Visit Date: 06/17/2022              Requested by: [name], MD [phone] [place],  [place] [address] PCP: [name], MD   Assessment & Plan: Visit Diagnoses:  1. Primary osteoarthritis of left knee   2. Primary osteoarthritis of right knee     Plan: Impression is advanced bilateral knee DJD worse on the left knee with varus deformity.  Treatment options reviewed to include prescription NSAIDs, cortisone injections, Visco injections, total knee replacement.  Based on her options she would like to try prescription meloxicam.  She will take this sparingly.  She will follow-up if she is interested in cortisone injections.  Follow-Up Instructions: No follow-ups on file.   Orders:  Orders Placed This Encounter  Procedures   XR KNEE 3 VIEW LEFT   XR KNEE 3 VIEW RIGHT   Meds ordered this encounter  Medications   meloxicam (MOBIC) 7.5 MG tablet    Sig: Take 1 tablet (7.5 mg total) by mouth 2 (two) times daily as needed for pain.    Dispense:  30 tablet    Refill:  2      Procedures: No procedures performed   Clinical Data: No additional findings.   Subjective: Chief Complaint  Patient presents with   Right Knee - Pain   Left Knee - Pain    HPI Pam [name] is a very pleasant 81 year old female here for bilateral knee pain greater on the left.  She has had pain and stiffness for years.  Denies previous surgeries or trauma.  Feels grinding in the knee.  Currently takes Tylenol for the pain.  Voltaren gel has not helped.  She has limited some of her activities in order to reduce the pain.  Review of Systems  Constitutional: Negative.   HENT: Negative.    Eyes: Negative.   Respiratory: Negative.    Cardiovascular: Negative.   Endocrine: Negative.   Musculoskeletal: Negative.   Neurological: Negative.   Hematological: Negative.    Psychiatric/Behavioral: Negative.    All other systems reviewed and are negative.    Objective: Vital Signs: Ht 5\' 1"  (1.549 m)   Wt 160 lb (72.6 kg)   BMI 30.23 kg/m   Physical Exam Vitals and nursing note reviewed.  Constitutional:      Appearance: She is well-developed.  HENT:     Head: Atraumatic.     Nose: Nose normal.  Eyes:     Extraocular Movements: Extraocular movements intact.  Cardiovascular:     Pulses: Normal pulses.  Pulmonary:     Effort: Pulmonary effort is normal.  Abdominal:     Palpations: Abdomen is soft.  Musculoskeletal:     Cervical back: Neck supple.  Skin:    General: Skin is warm.     Capillary Refill: Capillary refill takes less than 2 seconds.  Neurological:     Mental Status: She is alert. Mental status is at baseline.  Psychiatric:        Behavior: Behavior normal.        Thought Content: Thought content normal.        Judgment: Judgment normal.     Ortho Exam Examination of bilateral knees show varus deformities worse on the left.  Significant crepitus throughout range of  motion.  Collaterals and cruciates are stable.  Medial and lateral joint line tenderness. Specialty Comments:  No specialty comments available.  Imaging: XR KNEE 3 VIEW LEFT  Result Date: 06/17/2022 Advanced tricompartmental degenerative joint disease.  Bone-on-bone joint space narrowing.  Varus deformity.    XR KNEE 3 VIEW RIGHT  Result Date: 06/17/2022 Advanced tricompartmental degenerative joint disease.  Bone-on-bone joint space narrowing.    PMFS History: Patient Active Problem List   Diagnosis Date Noted   Primary osteoarthritis of left knee 06/17/2022   Primary osteoarthritis of right knee 06/17/2022   Past Medical History:  Diagnosis Date   High cholesterol    Hypertension     No family history on file.  History reviewed. No pertinent surgical history. Social History   Occupational History   Not on file  Tobacco Use   Smoking status:  Never   Smokeless tobacco: Never  Substance and Sexual Activity   Alcohol use: Never   Drug use: Never   Sexual activity: Never

## 2022-09-12 DIAGNOSIS — Z1211 Encounter for screening for malignant neoplasm of colon: Secondary | ICD-10-CM | POA: Diagnosis not present

## 2022-10-04 DIAGNOSIS — H402221 Chronic angle-closure glaucoma, left eye, mild stage: Secondary | ICD-10-CM | POA: Diagnosis not present

## 2022-10-04 DIAGNOSIS — H402212 Chronic angle-closure glaucoma, right eye, moderate stage: Secondary | ICD-10-CM | POA: Diagnosis not present

## 2023-04-20 DIAGNOSIS — H402212 Chronic angle-closure glaucoma, right eye, moderate stage: Secondary | ICD-10-CM | POA: Diagnosis not present

## 2023-04-20 DIAGNOSIS — H402221 Chronic angle-closure glaucoma, left eye, mild stage: Secondary | ICD-10-CM | POA: Diagnosis not present

## 2023-04-20 DIAGNOSIS — Z961 Presence of intraocular lens: Secondary | ICD-10-CM | POA: Diagnosis not present

## 2023-04-20 DIAGNOSIS — H35033 Hypertensive retinopathy, bilateral: Secondary | ICD-10-CM | POA: Diagnosis not present

## 2023-05-12 ENCOUNTER — Other Ambulatory Visit: Payer: Self-pay | Admitting: Orthopaedic Surgery

## 2023-06-30 DIAGNOSIS — R600 Localized edema: Secondary | ICD-10-CM | POA: Diagnosis not present

## 2023-06-30 DIAGNOSIS — Z1211 Encounter for screening for malignant neoplasm of colon: Secondary | ICD-10-CM | POA: Diagnosis not present

## 2023-06-30 DIAGNOSIS — H402290 Chronic angle-closure glaucoma, unspecified eye, stage unspecified: Secondary | ICD-10-CM | POA: Diagnosis not present

## 2023-06-30 DIAGNOSIS — Z0000 Encounter for general adult medical examination without abnormal findings: Secondary | ICD-10-CM | POA: Diagnosis not present

## 2023-06-30 DIAGNOSIS — I10 Essential (primary) hypertension: Secondary | ICD-10-CM | POA: Diagnosis not present

## 2023-06-30 DIAGNOSIS — Z9181 History of falling: Secondary | ICD-10-CM | POA: Diagnosis not present

## 2023-06-30 DIAGNOSIS — M85832 Other specified disorders of bone density and structure, left forearm: Secondary | ICD-10-CM | POA: Diagnosis not present

## 2023-06-30 DIAGNOSIS — Z1331 Encounter for screening for depression: Secondary | ICD-10-CM | POA: Diagnosis not present

## 2023-06-30 DIAGNOSIS — E785 Hyperlipidemia, unspecified: Secondary | ICD-10-CM | POA: Diagnosis not present

## 2023-07-03 ENCOUNTER — Other Ambulatory Visit: Payer: Self-pay | Admitting: Family Medicine

## 2023-07-03 DIAGNOSIS — M85832 Other specified disorders of bone density and structure, left forearm: Secondary | ICD-10-CM

## 2023-10-23 DIAGNOSIS — H402221 Chronic angle-closure glaucoma, left eye, mild stage: Secondary | ICD-10-CM | POA: Diagnosis not present

## 2023-10-23 DIAGNOSIS — H402212 Chronic angle-closure glaucoma, right eye, moderate stage: Secondary | ICD-10-CM | POA: Diagnosis not present

## 2024-03-12 DIAGNOSIS — E785 Hyperlipidemia, unspecified: Secondary | ICD-10-CM | POA: Diagnosis not present

## 2024-03-12 DIAGNOSIS — R600 Localized edema: Secondary | ICD-10-CM | POA: Diagnosis not present

## 2024-03-12 DIAGNOSIS — I10 Essential (primary) hypertension: Secondary | ICD-10-CM | POA: Diagnosis not present

## 2024-03-21 ENCOUNTER — Ambulatory Visit
Admission: RE | Admit: 2024-03-21 | Discharge: 2024-03-21 | Disposition: A | Discharge status: Home / Self Care | Payer: Medicare PPO | Source: Ambulatory Visit | Attending: Family Medicine | Admitting: Family Medicine

## 2024-03-21 DIAGNOSIS — M810 Age-related osteoporosis without current pathological fracture: Secondary | ICD-10-CM | POA: Diagnosis not present

## 2024-03-21 DIAGNOSIS — M85832 Other specified disorders of bone density and structure, left forearm: Secondary | ICD-10-CM

## 2024-04-22 DIAGNOSIS — H402212 Chronic angle-closure glaucoma, right eye, moderate stage: Secondary | ICD-10-CM | POA: Diagnosis not present

## 2024-04-22 DIAGNOSIS — H402221 Chronic angle-closure glaucoma, left eye, mild stage: Secondary | ICD-10-CM | POA: Diagnosis not present

## 2024-04-22 DIAGNOSIS — H35033 Hypertensive retinopathy, bilateral: Secondary | ICD-10-CM | POA: Diagnosis not present

## 2024-04-22 DIAGNOSIS — H02831 Dermatochalasis of right upper eyelid: Secondary | ICD-10-CM | POA: Diagnosis not present

## 2024-07-31 DIAGNOSIS — M85832 Other specified disorders of bone density and structure, left forearm: Secondary | ICD-10-CM | POA: Diagnosis not present

## 2024-07-31 DIAGNOSIS — H402290 Chronic angle-closure glaucoma, unspecified eye, stage unspecified: Secondary | ICD-10-CM | POA: Diagnosis not present

## 2024-07-31 DIAGNOSIS — R600 Localized edema: Secondary | ICD-10-CM | POA: Diagnosis not present

## 2024-07-31 DIAGNOSIS — Z0000 Encounter for general adult medical examination without abnormal findings: Secondary | ICD-10-CM | POA: Diagnosis not present

## 2024-07-31 DIAGNOSIS — E785 Hyperlipidemia, unspecified: Secondary | ICD-10-CM | POA: Diagnosis not present

## 2024-07-31 DIAGNOSIS — Z1331 Encounter for screening for depression: Secondary | ICD-10-CM | POA: Diagnosis not present

## 2024-07-31 DIAGNOSIS — I10 Essential (primary) hypertension: Secondary | ICD-10-CM | POA: Diagnosis not present

## 2024-07-31 DIAGNOSIS — M170 Bilateral primary osteoarthritis of knee: Secondary | ICD-10-CM | POA: Diagnosis not present

## 2024-07-31 DIAGNOSIS — Z23 Encounter for immunization: Secondary | ICD-10-CM | POA: Diagnosis not present

## 2024-10-24 DIAGNOSIS — H402221 Chronic angle-closure glaucoma, left eye, mild stage: Secondary | ICD-10-CM | POA: Diagnosis not present

## 2024-10-24 DIAGNOSIS — H402212 Chronic angle-closure glaucoma, right eye, moderate stage: Secondary | ICD-10-CM | POA: Diagnosis not present
